# Patient Record
Sex: Male | Born: 1975
Health system: Southern US, Community
[De-identification: ages and names within clinical notes are randomized; demographics above are authoritative.]

## PROBLEM LIST (undated history)

## (undated) DIAGNOSIS — R0683 Snoring: Secondary | ICD-10-CM

## (undated) DIAGNOSIS — E119 Type 2 diabetes mellitus without complications: Secondary | ICD-10-CM

## (undated) DIAGNOSIS — G4730 Sleep apnea, unspecified: Secondary | ICD-10-CM

## (undated) DIAGNOSIS — I10 Essential (primary) hypertension: Secondary | ICD-10-CM

## (undated) DIAGNOSIS — I493 Ventricular premature depolarization: Secondary | ICD-10-CM

## (undated) HISTORY — DX: Snoring: R06.83

## (undated) HISTORY — PX: WRIST SURGERY: SHX841

## (undated) HISTORY — DX: Ventricular premature depolarization: I49.3

---

## 2008-06-12 ENCOUNTER — Emergency Department (HOSPITAL_COMMUNITY): Admission: EM | Admit: 2008-06-12 | Discharge: 2008-06-12 | Payer: Self-pay | Admitting: Emergency Medicine

## 2010-07-02 LAB — WOUND CULTURE

## 2011-01-01 ENCOUNTER — Inpatient Hospital Stay (INDEPENDENT_AMBULATORY_CARE_PROVIDER_SITE_OTHER)
Admission: RE | Admit: 2011-01-01 | Discharge: 2011-01-01 | Disposition: A | Discharge status: Home / Self Care | Payer: BC Managed Care – PPO | Source: Ambulatory Visit | Attending: Emergency Medicine | Admitting: Emergency Medicine

## 2011-01-01 DIAGNOSIS — Z0489 Encounter for examination and observation for other specified reasons: Secondary | ICD-10-CM

## 2011-01-22 ENCOUNTER — Inpatient Hospital Stay (INDEPENDENT_AMBULATORY_CARE_PROVIDER_SITE_OTHER)
Admission: RE | Admit: 2011-01-22 | Discharge: 2011-01-22 | Disposition: A | Discharge status: Home / Self Care | Payer: BC Managed Care – PPO | Source: Ambulatory Visit | Attending: Family Medicine | Admitting: Family Medicine

## 2011-01-22 DIAGNOSIS — S61209A Unspecified open wound of unspecified finger without damage to nail, initial encounter: Secondary | ICD-10-CM

## 2011-04-27 ENCOUNTER — Encounter (HOSPITAL_COMMUNITY): Payer: Self-pay

## 2011-04-27 ENCOUNTER — Emergency Department (INDEPENDENT_AMBULATORY_CARE_PROVIDER_SITE_OTHER)
Admission: EM | Admit: 2011-04-27 | Discharge: 2011-04-27 | Disposition: A | Discharge status: Home / Self Care | Payer: BC Managed Care – PPO | Source: Home / Self Care | Attending: Family Medicine | Admitting: Family Medicine

## 2011-04-27 DIAGNOSIS — J111 Influenza due to unidentified influenza virus with other respiratory manifestations: Secondary | ICD-10-CM

## 2011-04-27 MED ORDER — OSELTAMIVIR PHOSPHATE 75 MG PO CAPS: 75.0000 mg | ORAL_CAPSULE | Freq: Two times a day (BID) | ORAL | Status: AC

## 2011-04-27 MED ORDER — GUAIFENESIN-CODEINE 100-10 MG/5ML PO SYRP: 10.0000 mL | ORAL_SOLUTION | Freq: Four times a day (QID) | ORAL | Status: AC | PRN

## 2011-04-27 NOTE — ED Provider Notes (Signed)
History     CSN: [id number]  Arrival date & time 04/27/11  0915   First MD Initiated Contact with Patient 04/27/11 [phone]      Chief Complaint  Patient presents with  . Fever  . URI    (Consider location/radiation/quality/duration/timing/severity/associated sxs/prior treatment) Patient is a 36 y.o. male presenting with fever and URI. The history is provided by the patient and the spouse.  Fever Primary symptoms of the febrile illness include fever, fatigue, cough and myalgias. Primary symptoms do not include nausea, vomiting, diarrhea, dysuria or rash. The current episode started yesterday. This is a new problem. The problem has been gradually worsening.  URI The primary symptoms include fever, fatigue, cough and myalgias. Primary symptoms do not include nausea, vomiting or rash.  Symptoms associated with the illness include chills, congestion and rhinorrhea.    History reviewed. No pertinent past medical history.  History reviewed. No pertinent past surgical history.  No family history on file.  History  Substance Use Topics  . Smoking status: Never Smoker   . Smokeless tobacco: Not on file  . Alcohol Use: No      Review of Systems  Constitutional: Positive for fever, chills and fatigue.  HENT: Positive for congestion, rhinorrhea and postnasal drip.   Respiratory: Positive for cough.   Cardiovascular: Negative.   Gastrointestinal: Negative.  Negative for nausea, vomiting and diarrhea.  Genitourinary: Negative for dysuria.  Musculoskeletal: Positive for myalgias.  Skin: Negative for rash.    Allergies  Review of patient's allergies indicates no known allergies.  Home Medications   Current Outpatient Rx  Name Route Sig Dispense Refill  . GUAIFENESIN-CODEINE 100-10 MG/5ML PO SYRP Oral Take 10 mLs by mouth 4 (four) times daily as needed for cough. 180 mL 0  . OSELTAMIVIR PHOSPHATE 75 MG PO CAPS Oral Take 1 capsule (75 mg total) by mouth every 12 (twelve) hours. 10  capsule 0    BP 139/96  Pulse 88  Temp(Src) 98.4 F (36.9 C) (Oral)  Resp 17  SpO2 95%  Physical Exam  Nursing note and vitals reviewed. Constitutional: He is oriented to person, place, and time. He appears well-developed and well-nourished.  HENT:  Head: Normocephalic.  Right Ear: External ear normal.  Left Ear: External ear normal.  Nose: Nose normal.  Mouth/Throat: Oropharynx is clear and moist.  Eyes: Conjunctivae are normal. Pupils are equal, round, and reactive to light.  Neck: Normal range of motion. Neck supple.  Cardiovascular: Normal rate, normal heart sounds and intact distal pulses.   Pulmonary/Chest: Effort normal and breath sounds normal.  Neurological: He is alert and oriented to person, place, and time.  Skin: Skin is warm and dry.  Psychiatric: He has a normal mood and affect.    ED Course  Procedures (including critical care time)  Labs Reviewed - No data to display No results found.   1. Influenza       MDM          Barkley Bruns, MD 04/27/11 1013

## 2011-04-27 NOTE — ED Notes (Signed)
C/o head and sinus congestion, stuffy nose, cough, fevers, nausea, bodyaches and sneezing since yesterday.

## 2011-10-10 IMAGING — CR DG FINGER THUMB 2+V*L*
3 series · 3 of 3 positions shown · non-contrast
Comparison: None.

CLINICAL DATA: Recent laceration.

LEFT THUMB 2+V

[x finger pa left]
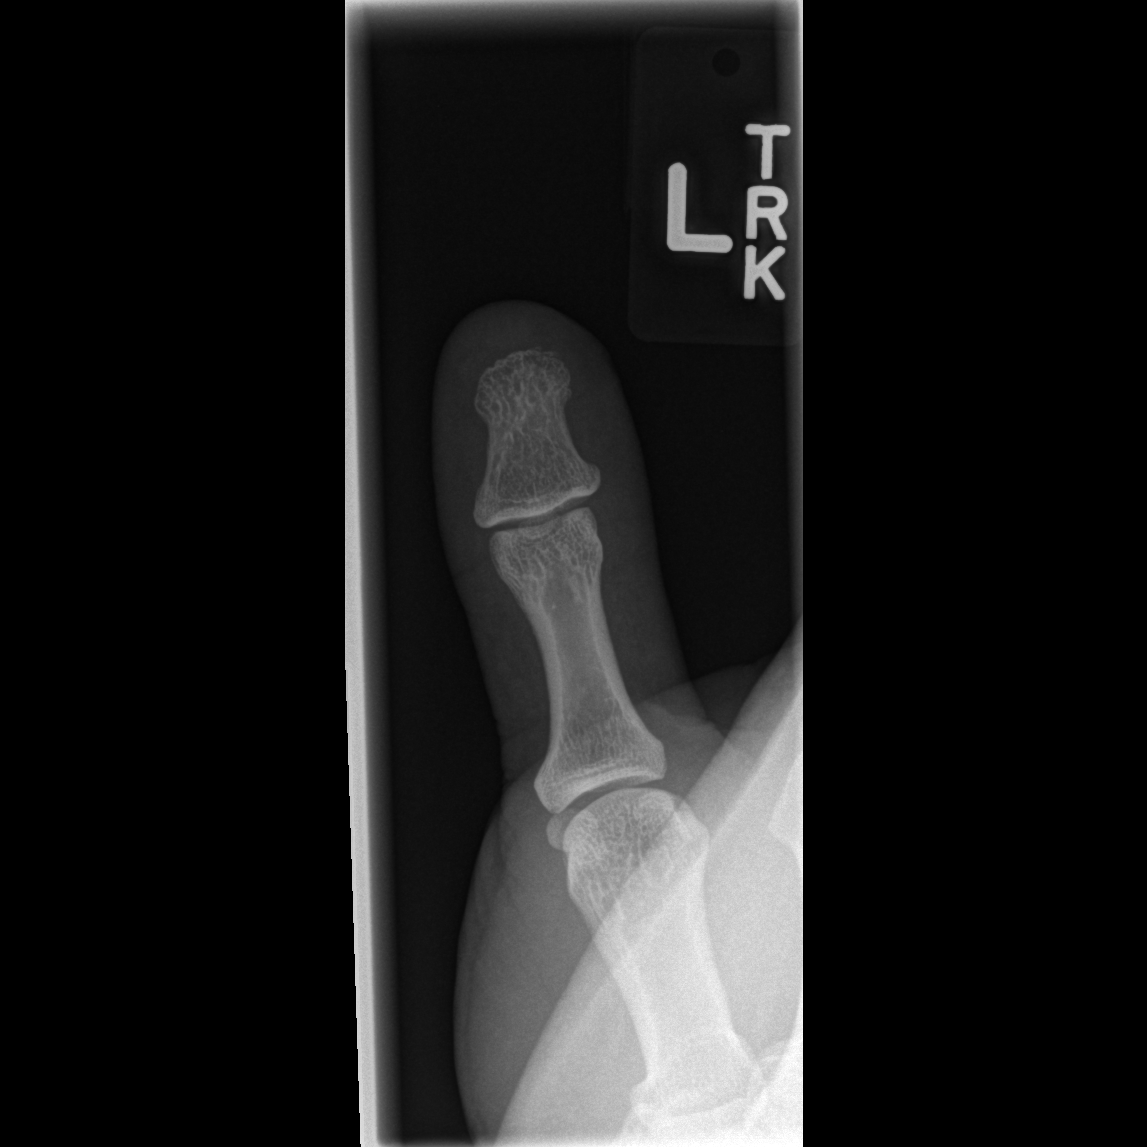

[x finger obl. left]
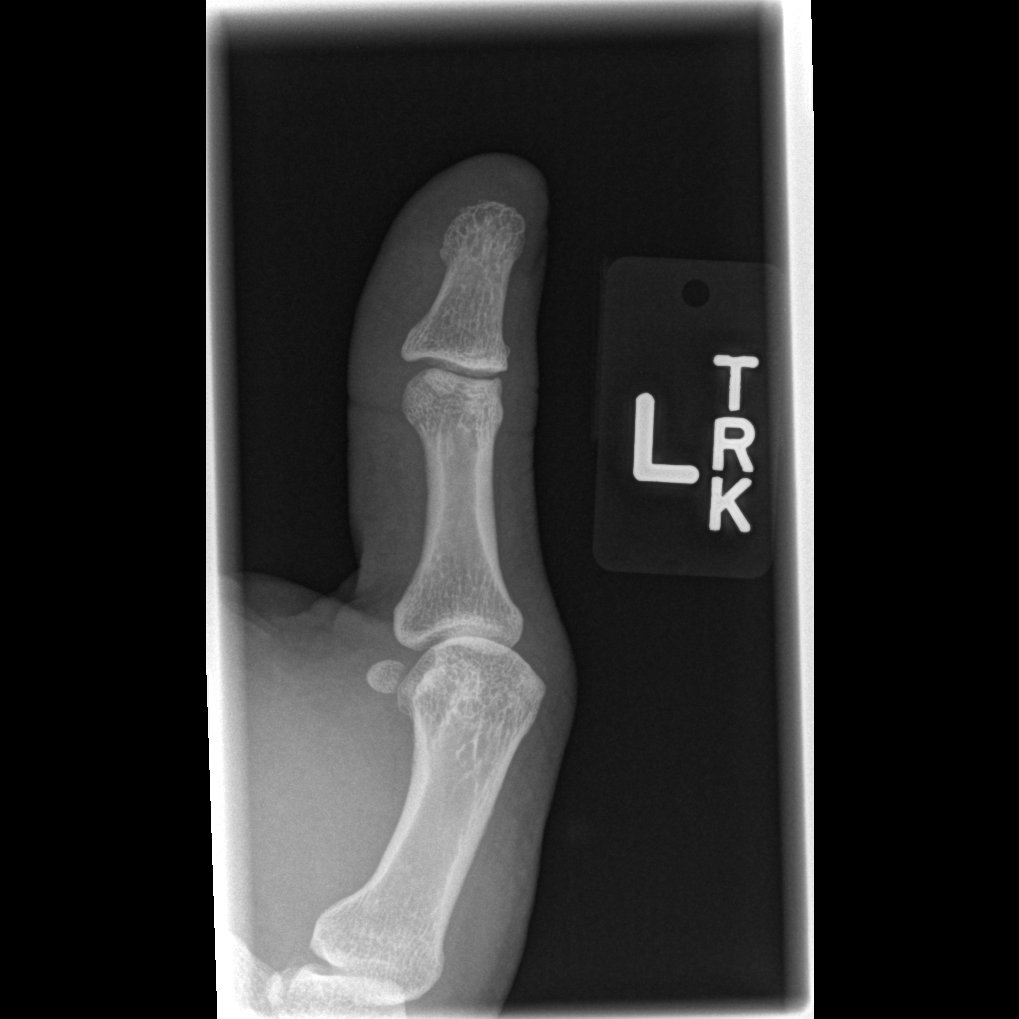

[x finger lateral left]
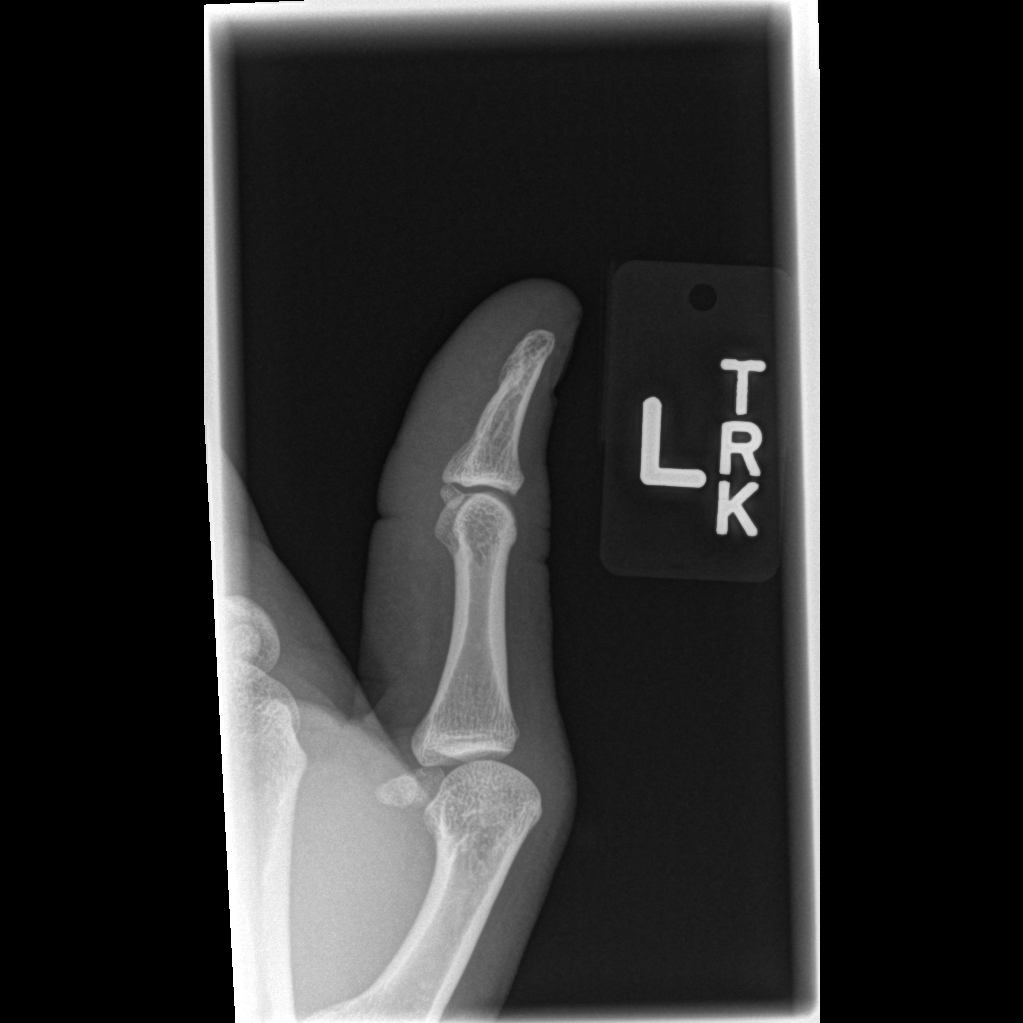

[3 of 3 positions shown; findings below may reference images not displayed]

FINDINGS: No acute fracture or dislocation is noted.] Mild soft
tissue changes are noted consistent with the recent injury.
IMPRESSION: No acute bony abnormality is seen.

## 2012-01-05 ENCOUNTER — Emergency Department (HOSPITAL_COMMUNITY)
Admission: EM | Admit: 2012-01-05 | Discharge: 2012-01-05 | Disposition: A | Discharge status: Home / Self Care | Payer: BC Managed Care – PPO | Attending: Emergency Medicine | Admitting: Emergency Medicine

## 2012-01-05 ENCOUNTER — Encounter (HOSPITAL_COMMUNITY): Payer: Self-pay | Admitting: Family Medicine

## 2012-01-05 DIAGNOSIS — H5500 Unspecified nystagmus: Secondary | ICD-10-CM | POA: Insufficient documentation

## 2012-01-05 DIAGNOSIS — I10 Essential (primary) hypertension: Secondary | ICD-10-CM | POA: Insufficient documentation

## 2012-01-05 DIAGNOSIS — H8110 Benign paroxysmal vertigo, unspecified ear: Secondary | ICD-10-CM | POA: Insufficient documentation

## 2012-01-05 DIAGNOSIS — R51 Headache: Secondary | ICD-10-CM | POA: Insufficient documentation

## 2012-01-05 LAB — CBC WITH DIFFERENTIAL/PLATELET
Basophils Absolute: 0 10*3/uL (ref 0.0–0.1)
Eosinophils Absolute: 0.1 10*3/uL (ref 0.0–0.7)
Eosinophils Relative: 1 % (ref 0–5)
HCT: 37.8 % — ABNORMAL LOW (ref 39.0–52.0)
Lymphocytes Relative: 25 % (ref 12–46)
MCH: 26.6 pg (ref 26.0–34.0)
MCV: 82.5 fL (ref 78.0–100.0)
Monocytes Absolute: 0.5 10*3/uL (ref 0.1–1.0)
RDW: 12 % (ref 11.5–15.5)
WBC: 8 10*3/uL (ref 4.0–10.5)

## 2012-01-05 LAB — BASIC METABOLIC PANEL
CO2: 29 mEq/L (ref 19–32)
Calcium: 9 mg/dL (ref 8.4–10.5)
Creatinine, Ser: 0.81 mg/dL (ref 0.50–1.35)
Glucose, Bld: 79 mg/dL (ref 70–99)

## 2012-01-05 LAB — TROPONIN I: Troponin I: <0.3 ng/mL (ref <0.30)

## 2012-01-05 MED ORDER — MECLIZINE HCL 25 MG PO TABS: 25.0000 mg | ORAL_TABLET | Freq: Three times a day (TID) | ORAL | Status: DC | PRN

## 2012-01-05 MED ORDER — MECLIZINE HCL 25 MG PO TABS
50.0000 mg | ORAL_TABLET | Freq: Once | ORAL | Status: AC
  Administered 2012-01-05: 50 mg via ORAL
  Filled 2012-01-05: qty 2

## 2012-01-05 NOTE — ED Provider Notes (Signed)
History     CSN: [id number]  Arrival date & time 01/05/12  1122   First MD Initiated Contact with Patient 01/05/12 1200      Chief Complaint  Patient presents with  . Hypertension  . Dizziness    (Consider location/radiation/quality/duration/timing/severity/associated sxs/prior treatment) HPI Comments: Luis Lopez is a 36 y.o. Male who presents with complaint of dizziness. Stats was getting dressed this morning, stood up and room began spinning. States could not see straight. States had to sit down, symptoms improved within about 2 min. States now having slight headache. States has had similar symptoms in the past. States thought it may be related to his blood pressure. States recently has been checking it at home it was in 160s. Pt states symptoms worsened by movement. Denies taking any medications for this. Denies numbness, weakness of extremities. No chest pain or SOB.    History reviewed. No pertinent past medical history.  History reviewed. No pertinent past surgical history.  History reviewed. No pertinent family history.  History  Substance Use Topics  . Smoking status: Never Smoker   . Smokeless tobacco: Not on file  . Alcohol Use: No      Review of Systems  Neurological: Positive for dizziness, light-headedness and headaches.  All other systems reviewed and are negative.    Allergies  Review of patient's allergies indicates no known allergies.  Home Medications  No current outpatient prescriptions on file.  BP 143/79  Pulse 71  Temp 97 F (36.1 C)  Resp 16  SpO2 99%  Physical Exam  Nursing note and vitals reviewed. Constitutional: He appears well-developed. No distress.  HENT:  Head: Normocephalic and atraumatic.  Right Ear: External ear normal.  Left Ear: External ear normal.  Nose: Nose normal.  Mouth/Throat: Oropharynx is clear and moist.  Eyes: Conjunctivae normal are normal. Pupils are equal, round, and reactive to light.       Horizontal  nystagmus present  Neck: Normal range of motion. Neck supple.  Cardiovascular: Normal rate, regular rhythm and normal heart sounds.   Pulmonary/Chest: Effort normal and breath sounds normal. No respiratory distress. He has no wheezes.  Abdominal: Soft. Bowel sounds are normal. He exhibits no distension. There is no tenderness. There is no rebound.  Musculoskeletal: Normal range of motion. He exhibits no edema.  Lymphadenopathy:    He has no cervical adenopathy.  Neurological: He is alert.       5/5 and equal upper and lower extremity strength bilaterally. Equal grip strength bilaterally. Normal finger to nose and heel to shin. No pronator drift. Patellar reflexes 2+   Skin: Skin is warm and dry. No rash noted. No erythema.  Psychiatric: He has a normal mood and affect.    ED Course  Procedures (including critical care time)  Pt with vertigo like symptoms. Exam shows normal neurologic exam except for vertical nystagmus present. Pt does have worsening dizziness with head repositioning. Symptoms intermittent. Pt low risk for CVA/central vertigo. Will try meclizine, labs.   Results for orders placed during the hospital encounter of 01/05/12  CBC WITH DIFFERENTIAL      Component Value Range   WBC 8.0  4.0 - 10.5 K/uL   RBC 4.58  4.22 - 5.81 MIL/uL   Hemoglobin 12.2 (*) 13.0 - 17.0 g/dL   HCT 30.8 (*) 65.7 - 84.6 %   MCV 82.5  78.0 - 100.0 fL   MCH 26.6  26.0 - 34.0 pg   MCHC 32.3  30.0 - 36.0 g/dL  RDW 12.0  11.5 - 15.5 %   Platelets 165  150 - 400 K/uL   Neutrophils Relative 67  43 - 77 %   Neutro Abs 5.4  1.7 - 7.7 K/uL   Lymphocytes Relative 25  12 - 46 %   Lymphs Abs 2.0  0.7 - 4.0 K/uL   Monocytes Relative 6  3 - 12 %   Monocytes Absolute 0.5  0.1 - 1.0 K/uL   Eosinophils Relative 1  0 - 5 %   Eosinophils Absolute 0.1  0.0 - 0.7 K/uL   Basophils Relative 0  0 - 1 %   Basophils Absolute 0.0  0.0 - 0.1 K/uL  BASIC METABOLIC PANEL      Component Value Range   Sodium 137  135  - 145 mEq/L   Potassium 4.0  3.5 - 5.1 mEq/L   Chloride 102  96 - 112 mEq/L   CO2 29  19 - 32 mEq/L   Glucose, Bld 79  70 - 99 mg/dL   BUN 15  6 - 23 mg/dL   Creatinine, Ser 1.61  0.50 - 1.35 mg/dL   Calcium 9.0  8.4 - 09.6 mg/dL   GFR calc non Af Amer >90  >90 mL/min   GFR calc Af Amer >90  >90 mL/min  TROPONIN I      Component Value Range   Troponin I <0.30  <0.30 ng/mL     Date: 01/05/2012  Rate: 67  Rhythm: normal sinus rhythm  QRS Axis: normal  Intervals: normal  ST/T Wave abnormalities: normal  Conduction Disutrbances:none  Narrative Interpretation:   Old EKG Reviewed: none available   3:54 PM  Labs unremarkable. ECG normal. Pt's symptoms resolved with meclizine. He ambulated with no difficulties. He denies dizziness. Normal gait. Normal neuro exam. Will d/c home. Do not think this is central vertigo based on resolution of symptoms and normal neuro examination. Instructed to return if worsening.    1. Vertigo, benign paroxysmal       MDM          Lottie Mussel, PA 01/05/12 1556

## 2012-01-05 NOTE — ED Provider Notes (Signed)
Medical screening examination/treatment/procedure(s) were performed by non-physician practitioner and as supervising physician I was immediately available for consultation/collaboration.  Jayelle Page, MD 01/05/12 1654 

## 2012-01-05 NOTE — ED Notes (Addendum)
Pt sts that he was getting dressed for work this am and stood up and felt like the room was spinning. sts has been seen and told to monitor high blood pressure and it has been consistently high. sts also a HA

## 2012-11-28 ENCOUNTER — Encounter (HOSPITAL_COMMUNITY): Payer: Self-pay | Admitting: *Deleted

## 2012-11-28 ENCOUNTER — Emergency Department (HOSPITAL_COMMUNITY): Payer: Worker's Compensation

## 2012-11-28 ENCOUNTER — Emergency Department (HOSPITAL_COMMUNITY)
Admission: EM | Admit: 2012-11-28 | Discharge: 2012-11-28 | Disposition: A | Discharge status: Home / Self Care | Payer: Worker's Compensation | Attending: Emergency Medicine | Admitting: Emergency Medicine

## 2012-11-28 DIAGNOSIS — S61209A Unspecified open wound of unspecified finger without damage to nail, initial encounter: Secondary | ICD-10-CM | POA: Insufficient documentation

## 2012-11-28 DIAGNOSIS — Z23 Encounter for immunization: Secondary | ICD-10-CM | POA: Insufficient documentation

## 2012-11-28 DIAGNOSIS — W292XXA Contact with other powered household machinery, initial encounter: Secondary | ICD-10-CM | POA: Insufficient documentation

## 2012-11-28 DIAGNOSIS — Y990 Civilian activity done for income or pay: Secondary | ICD-10-CM | POA: Insufficient documentation

## 2012-11-28 DIAGNOSIS — Y9289 Other specified places as the place of occurrence of the external cause: Secondary | ICD-10-CM | POA: Insufficient documentation

## 2012-11-28 DIAGNOSIS — Y9389 Activity, other specified: Secondary | ICD-10-CM | POA: Insufficient documentation

## 2012-11-28 MED ORDER — TETANUS-DIPHTH-ACELL PERTUSSIS 5-2.5-18.5 LF-MCG/0.5 IM SUSP
0.5000 mL | Freq: Once | INTRAMUSCULAR | Status: AC
  Administered 2012-11-28: 0.5 mL via INTRAMUSCULAR
  Filled 2012-11-28: qty 0.5

## 2012-11-28 MED ORDER — OXYCODONE-ACETAMINOPHEN 5-325 MG PO TABS
1.0000 | ORAL_TABLET | Freq: Once | ORAL | Status: AC
  Administered 2012-11-28: 1 via ORAL
  Filled 2012-11-28: qty 1

## 2012-11-28 MED ORDER — TETANUS-DIPHTH-ACELL PERTUSSIS 5-2.5-18.5 LF-MCG/0.5 IM SUSP: 0.5000 mL | Freq: Once | INTRAMUSCULAR | Status: DC

## 2012-11-28 MED ORDER — OXYCODONE-ACETAMINOPHEN 5-325 MG PO TABS: 2.0000 | ORAL_TABLET | Freq: Four times a day (QID) | ORAL | Status: DC | PRN

## 2012-11-28 MED ORDER — CEPHALEXIN 500 MG PO CAPS: 500.0000 mg | ORAL_CAPSULE | Freq: Four times a day (QID) | ORAL | Status: DC

## 2012-11-28 NOTE — ED Notes (Signed)
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  Bleeding controlled at this time.  Pt had a tetanus shot 3 years ago.  Pt reports 10/10 pain.

## 2012-11-28 NOTE — ED Notes (Signed)
Applied ns soaked gauze to thumb

## 2012-11-28 NOTE — ED Provider Notes (Signed)
CSN: [id number]     Arrival date & time 11/28/12  1513 History  This chart was scribed for non-physician practitioner Roxy Horseman, PA-C working with Gavin Pound. Oletta Lamas, MD by Danella Maiers, ED Scribe. This patient was seen in room TR11C/TR11C and the patient's care was started at 3:26 PM.    Chief Complaint  Patient presents with  . Laceration   The history is provided by the patient. No language interpreter was used.   HPI Comments: Luis Lopez is a 37 y.o. male who presents to the Emergency Department complaining of left thumb laceration that occurred while slicing the bone inside of a pork chop with a jigsaw at work and his hand slipped. Pain is mild with pain meds.  Bleeding is controlled.  No other complaints.  His last tetanus vaccine was 5 years ago.   History reviewed. No pertinent past medical history. History reviewed. No pertinent past surgical history. No family history on file. History  Substance Use Topics  . Smoking status: Never Smoker   . Smokeless tobacco: Not on file  . Alcohol Use: No    Review of Systems  All other systems reviewed and are negative.    Allergies  Review of patient's allergies indicates no known allergies.  Home Medications  No current outpatient prescriptions on file. BP 166/103  Pulse 85  Temp(Src) 98 F (36.7 C) (Oral)  Resp 22  SpO2 97% Physical Exam  Nursing note and vitals reviewed. Constitutional: He is oriented to person, place, and time. He appears well-developed and well-nourished. No distress.  HENT:  Head: Normocephalic and atraumatic.  Eyes: EOM are normal.  Neck: Neck supple. No tracheal deviation present.  Cardiovascular: Normal rate and intact distal pulses.   Brisk capillary refill.  Pulmonary/Chest: Effort normal. No respiratory distress.  Musculoskeletal: Normal range of motion.  Neurological: He is alert and oriented to person, place, and time.  Skin: Skin is warm and dry.  Avulsion injury of distal left  thumb.  Psychiatric: He has a normal mood and affect. His behavior is normal.    ED Course  Procedures (including critical care time) Medications  TDaP (BOOSTRIX) injection 0.5 mL (not administered)  oxyCODONE-acetaminophen (PERCOCET/ROXICET) 5-325 MG per tablet 1 tablet (1 tablet Oral Given 11/28/12 1523)  TDaP (BOOSTRIX) injection 0.5 mL (0.5 mLs Intramuscular Given 11/28/12 1601)   DIAGNOSTIC STUDIES: Oxygen Saturation is 97% on room air, normal by my interpretation.    COORDINATION OF CARE: 3:50 PM- Discussed treatment plan with pt which includes a left hand xray and pt agrees to plan.    Labs Review Labs Reviewed - No data to display Imaging Review Dg Finger Thumb Left  11/28/2012   *RADIOLOGY REPORT*  Clinical Data: Recent laceration.  LEFT THUMB 2+V  Comparison: None.  Findings: No acute fracture or dislocation is noted. Mild soft tissue changes are noted consistent with the recent injury.  IMPRESSION: No acute bony abnormality is seen.   Original Report Authenticated By: Alcide Clever, M.D.    MDM   1. Fingertip avulsion, initial encounter    Patient with fingertip avulsion to left thumb. I do not believe that there is anything that can be repaired. The nailbed is intact, but the lateral aspect of the distal common has been completely avulsed. No exposure to the bone, no fractures. Fluids rest with a bulky dressing, and give close followup with Dr. Amanda Pea. Patient discussed with Dr. Oletta Lamas, who agrees with plan. Patient is stable and ready for discharge. Pain meds and  antibiotics for home.    I personally performed the services described in this documentation, which was scribed in my presence. The recorded information has been reviewed and is accurate.    Roxy Horseman, PA-C 11/28/12 1720

## 2012-11-30 NOTE — ED Provider Notes (Signed)
Medical screening examination/treatment/procedure(s) were performed by non-physician practitioner and as supervising physician I was immediately available for consultation/collaboration.  Brannon Decaire Y. Kreston Ahrendt, MD 11/30/12 2206 

## 2014-10-26 ENCOUNTER — Encounter (HOSPITAL_COMMUNITY): Payer: Self-pay | Admitting: *Deleted

## 2014-10-26 ENCOUNTER — Emergency Department (HOSPITAL_COMMUNITY)
Admission: EM | Admit: 2014-10-26 | Discharge: 2014-10-26 | Disposition: A | Discharge status: Home / Self Care | Payer: Self-pay | Attending: Emergency Medicine | Admitting: Emergency Medicine

## 2014-10-26 DIAGNOSIS — K088 Other specified disorders of teeth and supporting structures: Secondary | ICD-10-CM | POA: Insufficient documentation

## 2014-10-26 DIAGNOSIS — Z79899 Other long term (current) drug therapy: Secondary | ICD-10-CM | POA: Insufficient documentation

## 2014-10-26 DIAGNOSIS — K0889 Other specified disorders of teeth and supporting structures: Secondary | ICD-10-CM

## 2014-10-26 DIAGNOSIS — K061 Gingival enlargement: Secondary | ICD-10-CM | POA: Insufficient documentation

## 2014-10-26 MED ORDER — PENICILLIN V POTASSIUM 250 MG PO TABS
250.0000 mg | ORAL_TABLET | Freq: Four times a day (QID) | ORAL | Status: AC
Start: 2014-10-26 — End: 2014-11-02

## 2014-10-26 NOTE — ED Notes (Signed)
Pt has growth to left lower gumline and also has left lower side dental pain. Airway intact.

## 2014-10-26 NOTE — ED Notes (Signed)
Declined W/C at D/C and was escorted to lobby by RN. 

## 2014-10-26 NOTE — ED Provider Notes (Signed)
CSN: [id number]     Arrival date & time 10/26/14  1228 History  This chart was scribed for Montine Circle, PA-C, working with Jola Schmidt, MD by Steva Colder, ED Scribe. The patient was seen in room TR11C/TR11C at 1:31 PM.    Chief Complaint  Patient presents with  . Dental Pain      The history is provided by the patient. No language interpreter was used.    Luis Lopez is a 39 y.o. male who presents to the Emergency Department complaining of left lower dental pain onset 5 months. Pt reports that he has a "growth" to his left lower gumline. Pt does not have a dentist that he can see. He reports that the area is now beginning to irritate him and that he now has decay to the tooth behind it. Pt reports that he had an abscess to the affected area that has began to drain. He states that he is having associated symptoms of nausea. He denies sore throat, fever, chills, and any other symptoms. Pt denies allergies to any medications.   History reviewed. No pertinent past medical history. History reviewed. No pertinent past surgical history. History reviewed. No pertinent family history. History  Substance Use Topics  . Smoking status: Never Smoker   . Smokeless tobacco: Not on file  . Alcohol Use: No    Review of Systems  Constitutional: Negative for fever and chills.  HENT: Positive for dental problem. Negative for sore throat and trouble swallowing.        Growth on left lower gumline  Gastrointestinal: Positive for nausea.      Allergies  Review of patient's allergies indicates no known allergies.  Home Medications   Prior to Admission medications   Medication Sig Start Date End Date Taking? Authorizing Provider  cephALEXin (KEFLEX) 500 MG capsule Take 1 capsule (500 mg total) by mouth 4 (four) times daily. 11/28/12   Montine Circle, PA-C  oxyCODONE-acetaminophen (PERCOCET/ROXICET) 5-325 MG per tablet Take 2 tablets by mouth every 6 (six) hours as needed for pain. 11/28/12    Montine Circle, PA-C   BP 146/89 mmHg  Pulse 83  Temp(Src) 98.5 F (36.9 C) (Oral)  Resp 18  Ht 6\' 4"  (1.93 m)  Wt 317 lb (143.79 kg)  BMI 38.60 kg/m2  SpO2 96% Physical Exam  Constitutional: He is oriented to person, place, and time. He appears well-developed and well-nourished. No distress.  HENT:  Head: Normocephalic and atraumatic.  Mouth/Throat:    Patient with toothache.  No gross abscess.  Exam unconcerning for Ludwig's angina or spread of infection.  Will treat with penicillin and pain medicine.  Urged patient to follow-up with dentist.     Eyes: EOM are normal.  Neck: Neck supple. No tracheal deviation present.  Cardiovascular: Normal rate.   Pulmonary/Chest: Effort normal. No respiratory distress.  Musculoskeletal: Normal range of motion.  Neurological: He is alert and oriented to person, place, and time.  Skin: Skin is warm and dry.  Psychiatric: He has a normal mood and affect. His behavior is normal.  Nursing note and vitals reviewed.   ED Course  Procedures (including critical care time) DIAGNOSTIC STUDIES: Oxygen Saturation is 96% on RA, nl by my interpretation.    COORDINATION OF CARE: 1:35 PM-Discussed treatment plan which includes referral to dentist and abx Rx with pt at bedside and pt agreed to plan.   Labs Review Labs Reviewed - No data to display  Imaging Review No results found.   EKG  Interpretation None      MDM   Final diagnoses:  Pain, dental  Gingival hyperplasia    Patient with toothache.  No gross abscess.  He also has a small growth on his gums which I urged him to have examined by a dentist or oral surgeon. Exam unconcerning for Ludwig's angina or spread of infection.  Will treat with penicillin and OTC pain medicine.  Urged patient to follow-up with dentist.     I personally performed the services described in this documentation, which was scribed in my presence. The recorded information has been reviewed and is  accurate.     Montine Circle, PA-C 10/26/14 [place], MD 10/26/14 [phone]

## 2014-10-26 NOTE — Discharge Instructions (Signed)
YOU NEED TO HAVE THE GROWTH ON YOUR GUMS EXAMINED BY A DENTIST OR ORAL SURGEON AS SOON AS POSSIBLE.  Dental Pain A tooth ache may be caused by cavities (tooth decay). Cavities expose the nerve of the tooth to air and hot or cold temperatures. It may come from an infection or abscess (also called a boil or furuncle) around your tooth. It is also often caused by dental caries (tooth decay). This causes the pain you are having. DIAGNOSIS  Your caregiver can diagnose this problem by exam. TREATMENT   If caused by an infection, it may be treated with medications which kill germs (antibiotics) and pain medications as prescribed by your caregiver. Take medications as directed.  Only take over-the-counter or prescription medicines for pain, discomfort, or fever as directed by your caregiver.  Whether the tooth ache today is caused by infection or dental disease, you should see your dentist as soon as possible for further care. SEEK MEDICAL CARE IF: The exam and treatment you received today has been provided on an emergency basis only. This is not a substitute for complete medical or dental care. If your problem worsens or new problems (symptoms) appear, and you are unable to meet with your dentist, call or return to this location. SEEK IMMEDIATE MEDICAL CARE IF:   You have a fever.  You develop redness and swelling of your face, jaw, or neck.  You are unable to open your mouth.  You have severe pain uncontrolled by pain medicine. MAKE SURE YOU:   Understand these instructions.  Will watch your condition.  Will get help right away if you are not doing well or get worse. Document Released: 03/08/2005 Document Revised: 05/31/2011 Document Reviewed: 10/25/2007 [facility] Patient Information 2015 [place], [place]. This information is not intended to replace advice given to you by your health care provider. Make sure you discuss any questions you have with your health care provider.

## 2015-04-07 ENCOUNTER — Emergency Department (HOSPITAL_COMMUNITY)
Admission: EM | Admit: 2015-04-07 | Discharge: 2015-04-07 | Disposition: A | Discharge status: Home / Self Care | Payer: Self-pay | Attending: Emergency Medicine | Admitting: Emergency Medicine

## 2015-04-07 ENCOUNTER — Encounter (HOSPITAL_COMMUNITY): Payer: Self-pay | Admitting: Adult Health

## 2015-04-07 DIAGNOSIS — I83892 Varicose veins of left lower extremities with other complications: Secondary | ICD-10-CM

## 2015-04-07 DIAGNOSIS — I8392 Asymptomatic varicose veins of left lower extremity: Secondary | ICD-10-CM | POA: Insufficient documentation

## 2015-04-07 LAB — I-STAT CHEM 8, ED
BUN: 18 mg/dL (ref 6–20)
CHLORIDE: 104 mmol/L (ref 101–111)
Calcium, Ion: 1.23 mmol/L (ref 1.12–1.23)
Creatinine, Ser: 1.1 mg/dL (ref 0.61–1.24)
GLUCOSE: 117 mg/dL — AB (ref 65–99)
HEMATOCRIT: 39 % (ref 39.0–52.0)
HEMOGLOBIN: 13.3 g/dL (ref 13.0–17.0)
POTASSIUM: 4 mmol/L (ref 3.5–5.1)
Sodium: 144 mmol/L (ref 135–145)
TCO2: 25 mmol/L (ref 0–100)

## 2015-04-07 NOTE — ED Notes (Signed)
Presents with left lower leg that had a prick in leg and then vericose vein was bleeding and "shooting out" bleeding stopped at this time.

## 2015-04-07 NOTE — Discharge Instructions (Signed)
Bleeding Varicose Veins Varicose veins are veins that have become enlarged and twisted. Valves in the veins help return blood from the leg to the heart. If these valves are damaged, blood flows backward and backs up into the veins in the leg near the skin. This causes the veins to become larger because of increased pressure within them. Sometimes these veins bleed. CAUSES  Factors that can lead to bleeding varicose veins include:  Thinning of the skin that covers the veins. This skin is stretched as the veins enlarge.  Weak and thinning walls of the varicose veins. These thin walls are part of the reason why blood is not flowing normally to the heart.  Having high pressure in the veins. This high pressure occurs because the blood is not flowing freely back up to the heart.  Injury. Even a small injury to a varicose vein can cause bleeding.  Open wounds. A sore may develop near a varicose vein and not heal. This makes bleeding more likely.  Taking medicine that thins the blood. These medicines may include aspirin, anti-inflammatory medicine, and other blood thinners. SIGNS AND SYMPTOMS  If bleeding is on the outside surface of the skin, blood can be seen. Sometimes, the bleeding stays under the skin. If this happens, the blue or purple area will spread beyond the vein. This discoloration may be visible. DIAGNOSIS  To decide if you have a bleeding varicose vein, your health care provider may:  Ask about your symptoms. This will include when you first saw bleeding.  Ask about how long you have had varicose veins and if they cause you problems.  Ask about your overall health.  Ask about possible causes, such as recent cuts or if the area near the varicose veins was bumped or injured.  Examine the skin or leg that concerns you. Your health care provider will probably feel the veins.  Order imaging tests. These create detailed pictures of the veins. TREATMENT  The first goal of treating  bleeding varicose veins is to stop the bleeding. Then, the aim is to keep any bleeding from happening again. Treatment will depend on the cause of the bleeding and how bad it is. Ask your health care provider about what would be best for you. Options include:  Raising (elevating) your leg. Lie down with your leg propped up on a pillow or cushion. Your foot should be above the level of your heart.  Applying pressure to the spot that is bleeding. The bleeding should stop in a short time.  Wearing elastic stockings that "compress" your legs (compression stockings). An elastic bandage may do the same thing.  Applying an antibiotic cream on sores that are not healing.  Closing off or surgically removing the bleeding varicose veins with one of the following:  Sclerotherapy. A solution is injected into the vein to close it off.  Laser treatment. A laser is used to heat the vein to close it off.  Radiofrequency vein ablation. An electrical current produced by radio waves is used to close off the vein.  Phlebectomy. The vein is surgically removed through small incisions made over the varicose vein.  Vein ligation and stripping. The vein is surgically removed through incisions made over the varicose vein after the vein has been tied (ligated). HOME CARE INSTRUCTIONS   Apply any creams that your health care provider prescribed. Follow the directions carefully.  Wear compression stockings or any wraps as directed by your health care provider. Make sure you know:  If  you should wear them every day.  How long you should wear them.  If veins were removed or closed, a bandage (dressing) will probably cover the area. Make sure you know:  How often the dressing should be changed.  Whether the area can get wet.  When you can leave the skin uncovered.  Check your skin every day. Look for new sores and signs of bleeding.  To prevent future bleeding:  Use extra care in situations where you could  cut your legs, such as when shaving or gardening.  Try to keep your legs elevated as much as possible. Lie down when you can. SEEK MEDICAL CARE IF:   Your veins continue to bleed.  You develop new sores near your varicose veins.  You have a sore that does not heal or gets bigger.  You have increased pain in your leg.  The area around a varicose vein becomes warm, red, or tender to the touch.  You notice a yellowish fluid that smells bad coming from a spot where there was bleeding.  You have a fever. SEEK IMMEDIATE MEDICAL CARE IF:   You have chest pain or difficulty breathing.  You have severe leg pain.   This information is not intended to replace advice given to you by your health care provider. Make sure you discuss any questions you have with your health care provider.   Document Released: 07/25/2008 Document Revised: 03/29/2014 Document Reviewed: 07/10/2013 Elsevier Interactive Patient Education 2016 [employer]  Stasis Dermatitis Stasis dermatitis occurs when veins lose the ability to pump blood back to the heart (poor venous circulation). It causes a reddish-purple to brownish scaly, itchy rash on the legs. The rash comes from pooling of blood (stasis). CAUSES  This occurs because the veins do not work very well anymore or because pressure may be increased in the veins due to other conditions. With blood pooling, the increased pressure in the tiny blood vessels (capillaries) causes fluid to leak out of the capillaries into the tissue. The extra fluid makes it harder for the blood to feed the cells and get rid of waste products. SYMPTOMS  Stasis dermatitis appears as red, scaly, itchy patches on the legs. A yellowish or light brown discoloration is also present. Due to scratching or other injury, these patches can become an ulcer. This ulcer may remain for long periods of time. The ulcer can also become infected. Swelling of the legs is often present with stasis dermatitis.  If the leg is swollen, this increases the risk of infection and further damage to the skin. Sometimes, intense itching, tingling, and burning occurs before signs of stasis dermatitis appear. You may find yourself scratching the insides of your ankles or rubbing your ankles together before the rash appears. After healing, there are often brown spots on the affected skin. DIAGNOSIS  Your caregiver makes this diagnosis based on an exam. Other tests may be done to better understand the cause. TREATMENT  If underlying conditions are present, they must be treated. Some of these conditions are heart failure, thyroid problems, poor nutrition, and varicose veins.  Cortisone creams and ointments applied to the skin (topically) may be needed, as well as medicine to reduce swelling in the legs (diuretics).  Compression stockings or an elastic wrap may also be needed to reduce swelling.  If there is an infection, antibiotic medicines may also be used. HOME CARE INSTRUCTIONS   Try to rest and raise (elevate) the affected leg above the level of the heart,  if possible.  Burow's solution wet packs applied for 30 minutes, 3 times daily, will help the weepy rash. Stop using the packs before your skin gets too dry. You can also use a mixture of 3 parts white vinegar to 1 quart water.  Grease your legs daily with ointments, such as petroleum jelly, to fight dryness.  Avoid scratching or injuring the area. SEEK IMMEDIATE MEDICAL CARE IF:   Your rash gets worse.  An ulcer forms.  You have an oral temperature above 102 F (38.9 C), not controlled by medicine.  You have any other severe symptoms.   This information is not intended to replace advice given to you by your health care provider. Make sure you discuss any questions you have with your health care provider.   Document Released: 06/17/2005 Document Revised: 05/31/2011 Document Reviewed: 07/24/2014 Elsevier Interactive Patient Education [employer]

## 2015-04-07 NOTE — ED Provider Notes (Signed)
CSN: [id number]     Arrival date & time 04/07/15  2124 History  By signing my name below, I, Hilda Lias, attest that this documentation has been prepared under the direction and in the presence of Threasa Alpha, PA-C.  Electronically Signed: Hilda Lias, ED Scribe. 04/07/2015. 10:39 PM.    Chief Complaint  Patient presents with  . Varicose Veins      The history is provided by the patient and the spouse. No language interpreter was used.   HPI Comments: Luis Lopez is a 40 y.o. male who presents to the Emergency Department complaining of bleeding from a varicose vein to his lateral left ankle that was pricked immediately PTA. His wife states that they could not control the bleeding until she wrapped his leg with a towel. Pt reports feeling lightheaded while his leg was bleeding. Pt currently denies lightheadedness, chest pain, SOB, palpitations, HA, fatigue, weakness. Pt denies issue with anemia, bleeding, or clotting.  No LE edema, erythema, rash, ulceration.  He denies any past complication with veins.  Denies pain.   History reviewed. No pertinent past medical history. History reviewed. No pertinent past surgical history. History reviewed. No pertinent family history. Social History  Substance Use Topics  . Smoking status: Never Smoker   . Smokeless tobacco: None  . Alcohol Use: No    Review of Systems  Constitutional: Negative for fever, chills, diaphoresis, activity change and fatigue.  HENT: Negative.   Respiratory: Negative.  Negative for chest tightness and shortness of breath.   Cardiovascular: Negative for chest pain.  Gastrointestinal: Negative.   Musculoskeletal: Negative.   Skin: Positive for wound. Negative for color change, pallor and rash.  Neurological: Negative.  Negative for dizziness, syncope, weakness, light-headedness and headaches.  Psychiatric/Behavioral: Negative.   All other systems reviewed and are negative.     Allergies  Review of patient's  allergies indicates no known allergies.  Home Medications   Prior to Admission medications   Medication Sig Start Date End Date Taking? Authorizing Provider  cephALEXin (KEFLEX) 500 MG capsule Take 1 capsule (500 mg total) by mouth 4 (four) times daily. Patient not taking: Reported on 04/07/2015 11/28/12   Montine Circle, PA-C  oxyCODONE-acetaminophen (PERCOCET/ROXICET) 5-325 MG per tablet Take 2 tablets by mouth every 6 (six) hours as needed for pain. Patient not taking: Reported on 04/07/2015 11/28/12   Montine Circle, PA-C   BP 134/88 mmHg  Pulse 77  Temp(Src) 97.8 F (36.6 C) (Oral)  Resp 16  SpO2 99% Physical Exam  Constitutional: He is oriented to person, place, and time. He appears well-developed and well-nourished. No distress.  HENT:  Head: Normocephalic and atraumatic.  Nose: Nose normal.  Mouth/Throat: Oropharynx is clear and moist. No oropharyngeal exudate.  No oral pallor, no palpebra conjunctiva pallor  Eyes: Conjunctivae and EOM are normal. Pupils are equal, round, and reactive to light. Right eye exhibits no discharge. Left eye exhibits no discharge. No scleral icterus.  Neck: Normal range of motion. No JVD present. No tracheal deviation present. No thyromegaly present.  Cardiovascular: Normal rate, regular rhythm, normal heart sounds and intact distal pulses.  Exam reveals no gallop and no friction rub.   No murmur heard. Pulmonary/Chest: Effort normal and breath sounds normal. No respiratory distress. He has no wheezes. He has no rales. He exhibits no tenderness.  Abdominal: Soft. Bowel sounds are normal. He exhibits no distension and no mass. There is no tenderness. There is no rebound and no guarding.  Musculoskeletal: Normal range of  motion. He exhibits no edema or tenderness.  Lymphadenopathy:    He has no cervical adenopathy.  Neurological: He is alert and oriented to person, place, and time. He has normal reflexes. No cranial nerve deficit. He exhibits normal  muscle tone. Coordination normal.  Skin: Skin is warm and dry. No rash noted. He is not diaphoretic. No erythema. No pallor.  Left lower leg, lateral aspect, superior to lateral malleolus, small hyperpigmented papule (< 1 cm in diameter) with central wound, no active bleeding when gauze and pressure dressing was removed. Bilateral LE hyperpigmentation with multiple varicose veins and spider veins, no pretibial edema, no ulceration, no erythema, no rash  Psychiatric: He has a normal mood and affect. His behavior is normal. Judgment and thought content normal.  Nursing note and vitals reviewed.   ED Course  Procedures (including critical care time)  DIAGNOSTIC STUDIES: Oxygen Saturation is 95% on room air, normal by my interpretation.    COORDINATION OF CARE: 10:36 PM Discussed treatment plan with pt at bedside and pt agreed to plan.   Labs Review Labs Reviewed  I-STAT CHEM 8, ED - Abnormal; Notable for the following:    Glucose, Bld 117 (*)    All other components within normal limits    Imaging Review No results found. I have personally reviewed and evaluated these images and lab results as part of my medical decision-making.   EKG Interpretation None      MDM   Pt with varicose veins that bled tonight after skin was accidentally "pricked" Pt had lightheadedness during active bleeding.   At time of exam there was no active bleeding, pt currently asymptomatic Chem 8 ordered to check H/H.  Pt is not pale, VSS.  H/H normal, no anemia, no further bleeding. Dressing applied.   Pt d/c home in stable and satisfactory condition.   Filed Vitals:   04/07/15 2154 04/07/15 2309  BP: 137/90 134/88  Pulse: 86 77  Temp: 97.8 F (36.6 C)   TempSrc: Oral   Resp: 18 16  SpO2: 95% 99%     Final diagnoses:  Bleeding from varicose veins of lower extremity, left  I personally performed the services described in this documentation, which was scribed in my presence. The recorded  information has been reviewed and is accurate.      Delsa Grana, PA-C 04/07/15 [place], MD 04/08/15 2252

## 2015-10-20 DIAGNOSIS — R0683 Snoring: Secondary | ICD-10-CM | POA: Diagnosis not present

## 2015-10-20 DIAGNOSIS — Z13228 Encounter for screening for other metabolic disorders: Secondary | ICD-10-CM | POA: Diagnosis not present

## 2015-10-20 DIAGNOSIS — Z13220 Encounter for screening for lipoid disorders: Secondary | ICD-10-CM | POA: Diagnosis not present

## 2015-10-20 DIAGNOSIS — R351 Nocturia: Secondary | ICD-10-CM | POA: Diagnosis not present

## 2015-10-24 DIAGNOSIS — F4321 Adjustment disorder with depressed mood: Secondary | ICD-10-CM | POA: Diagnosis not present

## 2015-10-30 DIAGNOSIS — F4321 Adjustment disorder with depressed mood: Secondary | ICD-10-CM | POA: Diagnosis not present

## 2015-11-05 DIAGNOSIS — F4321 Adjustment disorder with depressed mood: Secondary | ICD-10-CM | POA: Diagnosis not present

## 2015-11-06 ENCOUNTER — Encounter: Payer: Self-pay | Admitting: Neurology

## 2015-11-06 ENCOUNTER — Ambulatory Visit (INDEPENDENT_AMBULATORY_CARE_PROVIDER_SITE_OTHER): Payer: BLUE CROSS/BLUE SHIELD | Admitting: Neurology

## 2015-11-06 VITALS — BP 142/88 | HR 80 | Resp 20 | Ht 74.5 in | Wt 327.0 lb

## 2015-11-06 DIAGNOSIS — G4710 Hypersomnia, unspecified: Secondary | ICD-10-CM | POA: Diagnosis not present

## 2015-11-06 DIAGNOSIS — R351 Nocturia: Secondary | ICD-10-CM | POA: Insufficient documentation

## 2015-11-06 DIAGNOSIS — G4733 Obstructive sleep apnea (adult) (pediatric): Secondary | ICD-10-CM | POA: Diagnosis not present

## 2015-11-06 DIAGNOSIS — Z72820 Sleep deprivation: Secondary | ICD-10-CM

## 2015-11-06 DIAGNOSIS — G4730 Sleep apnea, unspecified: Secondary | ICD-10-CM | POA: Diagnosis not present

## 2015-11-06 NOTE — Patient Instructions (Signed)
Hypersomnia Hypersomnia is when you feel extremely tired during the day even though you're getting plenty of sleep at night. You may need to take naps during the day, and you may also be extremely difficult to wake up when you are sleeping.  CAUSES  The cause of your hypersomnia may not be known. Hypersomnia may be caused by:   Medicines.  Sleep disorders, such as narcolepsy.  Trauma or injury to your head or nervous system.  Using drugs or alcohol.  Tumors.  Medical conditions, such as depression or hypothyroidism.  Genetics. SIGNS AND SYMPTOMS  The main symptoms of hypersomnia include:   Feeling extremely tired throughout the day.  Being very difficult to wake up.  Sleeping for longer and longer periods.  Taking naps throughout the day. Other symptoms may include:   Feeling:  Restless.  Annoyed.  Anxious.  Low energy.  Having difficulty:  Remembering.  Speaking.  Thinking.  Losing your appetite.  Experiencing hallucinations. DIAGNOSIS  Hypersomnia may be diagnosed by:  Medical history and physical exam. This will include a sleep history.  Completing sleep logs.  Tests may also be done, such as:  Polysomnography.  Multiple sleep latency test (MSLT). TREATMENT  There is no cure for hypersomnia, but treatment can be very effective in helping manage the condition. Treatment may include:  Lifestyle and sleeping strategies to help cope with the condition.  Stimulant medicines.  Treating any underlying causes of hypersomnia. HOME CARE INSTRUCTIONS  Take medicines only as directed by your health care provider.  Schedule short naps for when you feel sleepiest during the day. Tell your employer or teachers that you have hypersomnia. You may be able to adjust your schedule to include time for naps.  Avoid drinking alcohol or caffeinated beverages.  Do not eat a heavy meal before bedtime. Eat at about the same times every day.  Do not drive or  operate heavy machinery if you are sleepy.  Do not swim or go out on the water without a life jacket.  If possible, adjust your schedule so that you do not have to work or be active at night.  Keep all follow-up visits as directed by your health care provider. This is important. SEEK MEDICAL CARE IF:   You have new symptoms.  Your symptoms get worse. SEEK IMMEDIATE MEDICAL CARE IF:  You have serious thoughts of hurting yourself or someone else.   This information is not intended to replace advice given to you by your health care provider. Make sure you discuss any questions you have with your health care provider.   Document Released: 02/26/2002 Document Revised: 03/29/2014 Document Reviewed: 10/11/2013 Elsevier Interactive Patient Education 2016 [employer]  

## 2015-11-06 NOTE — Progress Notes (Signed)
SLEEP MEDICINE CLINIC   Provider:  Larey Seat, M D  Referring Provider: [name], FNP Primary Care Physician:    Chief Complaint  Patient presents with  . New Patient (Initial Visit)    snores at night, witnessed apnea, never had a sleep study    HPI:  Luis Lopez is a 40 y.o. male , seen here as a referral  from NP [facility] for a sleep referral , based on a chief concern of excessive daytime sleepiness.  Mr. and Mrs. Luis Lopez are here together, and Mrs. Luis Lopez reports that she has known her husband for about 7 years now, and always found him to be sleepier than the average individual area Mr. Luis Lopez states that that wasn't an acute change but made him sleepier and he cannot correlate his sleepiness to a prescription drug or medical procedure or any kind of accident or trauma. It was a gradual change over many years leading to hypersomnia.  Mr. Luis Lopez is snoring and he has frequent nocturia, and his wife has witnessed apneas at night as well. He has fallen asleep at work and he has never the feeling that he had enough sleep is completely restored or refreshed. The nocturia is not helping it at as this fragments asleep further, he believes that he goes to the bathroom 6-7 times each night. He feels very much bothered by the frequency.  He endorsed the Epworth sleepiness score at 19 points.    Sleep habits are as follows: He usually reaches home after work around midnight, and he needs time to wind down. He usually can't go to bed and sleep until about 2 AM, The bedroom is described as cool, quiet and dark at the couple shares the bedroom. He sleeps mostly on his back but this is when he snores the loudest and his wife will nudge him to roll over. He uses 2 pillows. He is up every hour for his frequent nocturia. He rises usually in the morning at about 7 AM. He is severely sleep deprived having only 5 hours of sleep plus interruptions. Since his wife works night shifts he has to get her  children ready for school. He has no reserved nap times.   Sleep medical history and family sleep history: He has a strong family history of diabetes but has not been diagnosed.   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 It is especially important for him to be alert. 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  6 children, no toddlers in the house. He quit smoking about 7 years ago, but he was not a daily smoker and only smoked cigars. He has not been drinking alcohol for long time.Marland Kitchen He drinks 2 cups of coffee a day and takes energy drinks like red bull. 1-2 sodas a day, iced tea- 1-2 a day.     Review of Systems: Out of a complete 14 system review, the patient complains of only the following symptoms, and all other reviewed systems are negative. He endorsed vertigo, snoring, weight gain, not enough sleep, dizziness, memory loss, insomnia and excessive hypersomnia, snoring. Witnessed apneas.  How likely are you to doze in the following situations: 0 = not likely, 1 = slight chance, 2 = moderate chance, 3 = high chance  Sitting and Reading? 2 Watching Television? 3 Sitting inactive in a public place (theater or meeting)?2 Lying down in the afternoon when circumstances permit?2 Sitting and talking  to someone?2 Sitting quietly after lunch without alcohol?3 In a car, while stopped for a few minutes in traffic?2 As a passenger in a car for an hour without a break?3  Total = 19   , Fatigue severity score 52  , depression score  2/15    Social History   Social History  . Marital status: Married    Spouse name: N/A  . Number of children: N/A  . Years of education: N/A   Occupational History  . Not on file.   Social History Main Topics  . Smoking status: Former Smoker    Quit date: 03/22/2008  . Smokeless tobacco: Never Used  . Alcohol use No  . Drug use: No  . Sexual activity: Not on file   Other Topics Concern  .  Not on file   Social History Narrative  . No narrative on file    Family History  Problem Relation Age of Onset  . Diabetes Father   . Diabetes Brother     Past Medical History:  Diagnosis Date  . Snoring     No past surgical history on file.  No current outpatient prescriptions on file.   No current facility-administered medications for this visit.     Allergies as of 11/06/2015  . (No Known Allergies)    Vitals: BP (!) 142/88   Pulse 80   Resp 20   Ht 6' 2.5" (1.892 m)   Wt (!) 327 lb (148.3 kg)   BMI 41.42 kg/m  Last Weight:  Wt Readings from Last 1 Encounters:  11/06/15 (!) 327 lb (148.3 kg)   [id number] mass index is 41.42 kg/m.     Last Height:   Ht Readings from Last 1 Encounters:  11/06/15 6' 2.5" (1.892 m)    Gained 100 pounds in 10 years.   Physical exam:  General: The patient is awake, alert and appears not in acute distress. The patient is well groomed. Head: Normocephalic, atraumatic. Neck is supple. Mallampati 3  neck circumference:21. Nasal airflow patent , TMJ is not evident . Retrognathia is seen.  Biological teeth.  Cardiovascular:  Regular rate and rhythm, without  murmurs or carotid bruit, and without distended neck veins. Respiratory: Lungs are clear to auscultation. Skin:  Without evidence of edema, or rash Trunk: BMI is elevated . The patient's posture is erect   Neurologic exam : The patient is awake and alert, oriented to place and time.   Attention span & concentration ability appears normal.  Speech is fluent,  without  dysarthria, dysphonia or aphasia.  Mood and affect are appropriate.  Cranial nerves: Pupils are equal and briskly reactive to light. Funduscopic exam without evidence of pallor or edema. Extraocular movements  in vertical and horizontal planes intact and without nystagmus. Visual fields by finger perimetry are intact.Hearing to finger rub intact. Facial sensation intact to fine touch.Facial motor strength is  symmetric and tongue and uvula move midline. Shoulder shrug was symmetrical.   Motor exam:  Normal tone, muscle bulk and symmetric strength in all extremities.  Sensory:  Fine touch, pinprick and vibration/  Proprioception tested in the upper extremities was normal.  Coordination: Rapid alternating movements /Finger-to-nose maneuver  normal without evidence of ataxia, dysmetria or tremor.  Gait and station: Patient walks without assistive device and is able unassisted to climb up to the exam table. Strength within normal limits.  Stance is stable and normal.  Deep tendon reflexes: in the  upper and lower extremities are symmetric and  intact. Babinski maneuver response is downgoing.  The patient was advised of the nature of the diagnosed sleep disorder , the treatment options and risks for general a health and wellness arising from not treating the condition.  I spent more than 45 minutes of face to face time with the patient. Greater than 50% of time was spent in counseling and coordination of care. We have discussed the diagnosis and differential and I answered the patient's questions.     Assessment:  After physical and neurologic examination, review of laboratory studies,  Personal review of imaging studies, reports of other /same  Imaging studies ,  Results of polysomnography/ neurophysiology testing and pre-existing records as far as provided in visit., my assessment is   1) Mr. Luis Lopez has multiple high risk factors for obstructive sleep apnea including a larger neck circumference and a high body mass index that places in the category of morbid obesity. He has gained the weight gradually over the last 10 years and this seems to correlate with an increase in daytime sleepiness. In addition he is severely sleep deprived allowing himself only 5 hours in bed and these are still fragmented by nocturia. His wife has noticed him to snore loudly when in supine position, and she has noticed apneas as  well. I will order a split night polysomnography for this patient. SPLIT at 15, watch for hypoxemia. He has sleep related headaches actually states that a lot of times he wakes up with headaches ( not by headaches ) and for this reason I will also order capnography. The nocturia could be partially related to caffeine intake, partially OSA related, he is currently not treated for hypertension.  I was able to review his labs and he has actually excellent cholesterol levels of 143 g/dL, hemoglobin A1c was 5.3% TSH was 1.5 and his complete metabolic panel and CBC with differential were unremarkable.  Plan:  Treatment plan and additional workup :  SPLIT with Co2, RV with me after study      Asencion Partridge Zarinah Oviatt MD  11/06/2015   CC: [name] [address] [place], [place] [address]

## 2015-11-13 DIAGNOSIS — F4321 Adjustment disorder with depressed mood: Secondary | ICD-10-CM | POA: Diagnosis not present

## 2015-11-20 DIAGNOSIS — F4321 Adjustment disorder with depressed mood: Secondary | ICD-10-CM | POA: Diagnosis not present

## 2015-12-04 DIAGNOSIS — F4321 Adjustment disorder with depressed mood: Secondary | ICD-10-CM | POA: Diagnosis not present

## 2015-12-10 DIAGNOSIS — F4321 Adjustment disorder with depressed mood: Secondary | ICD-10-CM | POA: Diagnosis not present

## 2015-12-22 ENCOUNTER — Ambulatory Visit (INDEPENDENT_AMBULATORY_CARE_PROVIDER_SITE_OTHER): Payer: BLUE CROSS/BLUE SHIELD | Admitting: Neurology

## 2015-12-22 DIAGNOSIS — Z72820 Sleep deprivation: Secondary | ICD-10-CM

## 2015-12-22 DIAGNOSIS — R351 Nocturia: Secondary | ICD-10-CM

## 2015-12-22 DIAGNOSIS — G4730 Sleep apnea, unspecified: Secondary | ICD-10-CM

## 2015-12-22 DIAGNOSIS — G4710 Hypersomnia, unspecified: Secondary | ICD-10-CM

## 2015-12-22 DIAGNOSIS — G4733 Obstructive sleep apnea (adult) (pediatric): Secondary | ICD-10-CM | POA: Diagnosis not present

## 2015-12-23 ENCOUNTER — Telehealth: Payer: Self-pay

## 2015-12-23 DIAGNOSIS — G4733 Obstructive sleep apnea (adult) (pediatric): Secondary | ICD-10-CM

## 2015-12-23 NOTE — Telephone Encounter (Signed)
I spoke to pt. I advised him that his sleep study revealed severe osa and Dr. Brett Fairy recommends starting a cpap. Pt is agreeable to starting a cpap. I advised pt to lose weight, by diet and exercise if not contraindicated by his other physicians. I will send order to [facility]. Pt verbalized understanding of results. Pt asked if he could have a letter for work stating that he had a sleep study the night of 12/22/2015. He wants to pick this letter up at the front desk. I advised him that it would be ready tomorrow for pick up. Pt verbalized understanding. Pt had no questions at this time but was encouraged to call back if questions arise. A follow up appt was made with pt for 02/23/2016. Pt verbalized understanding.  Dr. Brett Fairy agreed to pt's work note. She signed it and it has been placed at the front desk for pick up.

## 2016-01-08 DIAGNOSIS — G4733 Obstructive sleep apnea (adult) (pediatric): Secondary | ICD-10-CM | POA: Diagnosis not present

## 2016-02-23 ENCOUNTER — Telehealth: Payer: Self-pay

## 2016-02-23 ENCOUNTER — Ambulatory Visit: Payer: Self-pay | Admitting: Neurology

## 2016-02-23 NOTE — Telephone Encounter (Signed)
Patient did not show to appt today  

## 2016-02-24 ENCOUNTER — Encounter: Payer: Self-pay | Admitting: Neurology

## 2016-05-18 ENCOUNTER — Encounter (HOSPITAL_COMMUNITY): Payer: Self-pay

## 2016-05-18 ENCOUNTER — Emergency Department (HOSPITAL_COMMUNITY)
Admission: EM | Admit: 2016-05-18 | Discharge: 2016-05-18 | Disposition: A | Discharge status: Home / Self Care | Payer: Self-pay | Attending: Emergency Medicine | Admitting: Emergency Medicine

## 2016-05-18 ENCOUNTER — Emergency Department (HOSPITAL_COMMUNITY): Payer: Self-pay

## 2016-05-18 DIAGNOSIS — Z79899 Other long term (current) drug therapy: Secondary | ICD-10-CM | POA: Insufficient documentation

## 2016-05-18 DIAGNOSIS — R0789 Other chest pain: Secondary | ICD-10-CM | POA: Insufficient documentation

## 2016-05-18 DIAGNOSIS — I10 Essential (primary) hypertension: Secondary | ICD-10-CM | POA: Insufficient documentation

## 2016-05-18 DIAGNOSIS — E119 Type 2 diabetes mellitus without complications: Secondary | ICD-10-CM | POA: Insufficient documentation

## 2016-05-18 DIAGNOSIS — Z87891 Personal history of nicotine dependence: Secondary | ICD-10-CM | POA: Insufficient documentation

## 2016-05-18 HISTORY — DX: Essential (primary) hypertension: I10

## 2016-05-18 HISTORY — DX: Type 2 diabetes mellitus without complications: E11.9

## 2016-05-18 HISTORY — DX: Sleep apnea, unspecified: G47.30

## 2016-05-18 LAB — BASIC METABOLIC PANEL
Anion gap: 10 (ref 5–15)
BUN: 14 mg/dL (ref 6–20)
CO2: 23 mmol/L (ref 22–32)
Calcium: 8.8 mg/dL — ABNORMAL LOW (ref 8.9–10.3)
Chloride: 103 mmol/L (ref 101–111)
Creatinine, Ser: 1.14 mg/dL (ref 0.61–1.24)
GFR calc Af Amer: >60 mL/min (ref >60)
GFR calc non Af Amer: >60 mL/min (ref >60)
Glucose, Bld: 111 mg/dL — ABNORMAL HIGH (ref 65–99)
Potassium: 4.9 mmol/L (ref 3.5–5.1)
Sodium: 136 mmol/L (ref 135–145)

## 2016-05-18 LAB — CBC
HCT: 36.3 % — ABNORMAL LOW (ref 39.0–52.0)
Hemoglobin: 11.5 g/dL — ABNORMAL LOW (ref 13.0–17.0)
MCH: 26.3 pg (ref 26.0–34.0)
MCHC: 31.7 g/dL (ref 30.0–36.0)
MCV: 82.9 fL (ref 78.0–100.0)
Platelets: 144 10*3/uL — ABNORMAL LOW (ref 150–400)
RBC: 4.38 MIL/uL (ref 4.22–5.81)
RDW: 12.4 % (ref 11.5–15.5)
WBC: 8.8 10*3/uL (ref 4.0–10.5)

## 2016-05-18 LAB — I-STAT TROPONIN, ED
Troponin i, poc: 0 ng/mL (ref 0.00–0.08)
Troponin i, poc: 0 ng/mL (ref 0.00–0.08)

## 2016-05-18 IMAGING — DX DG CHEST 2V
2 series · 2 of 2 positions shown · non-contrast
Comparison: None.

CLINICAL DATA: Left and central chest pain.

EXAM:
CHEST  2 VIEW

[chest pa]
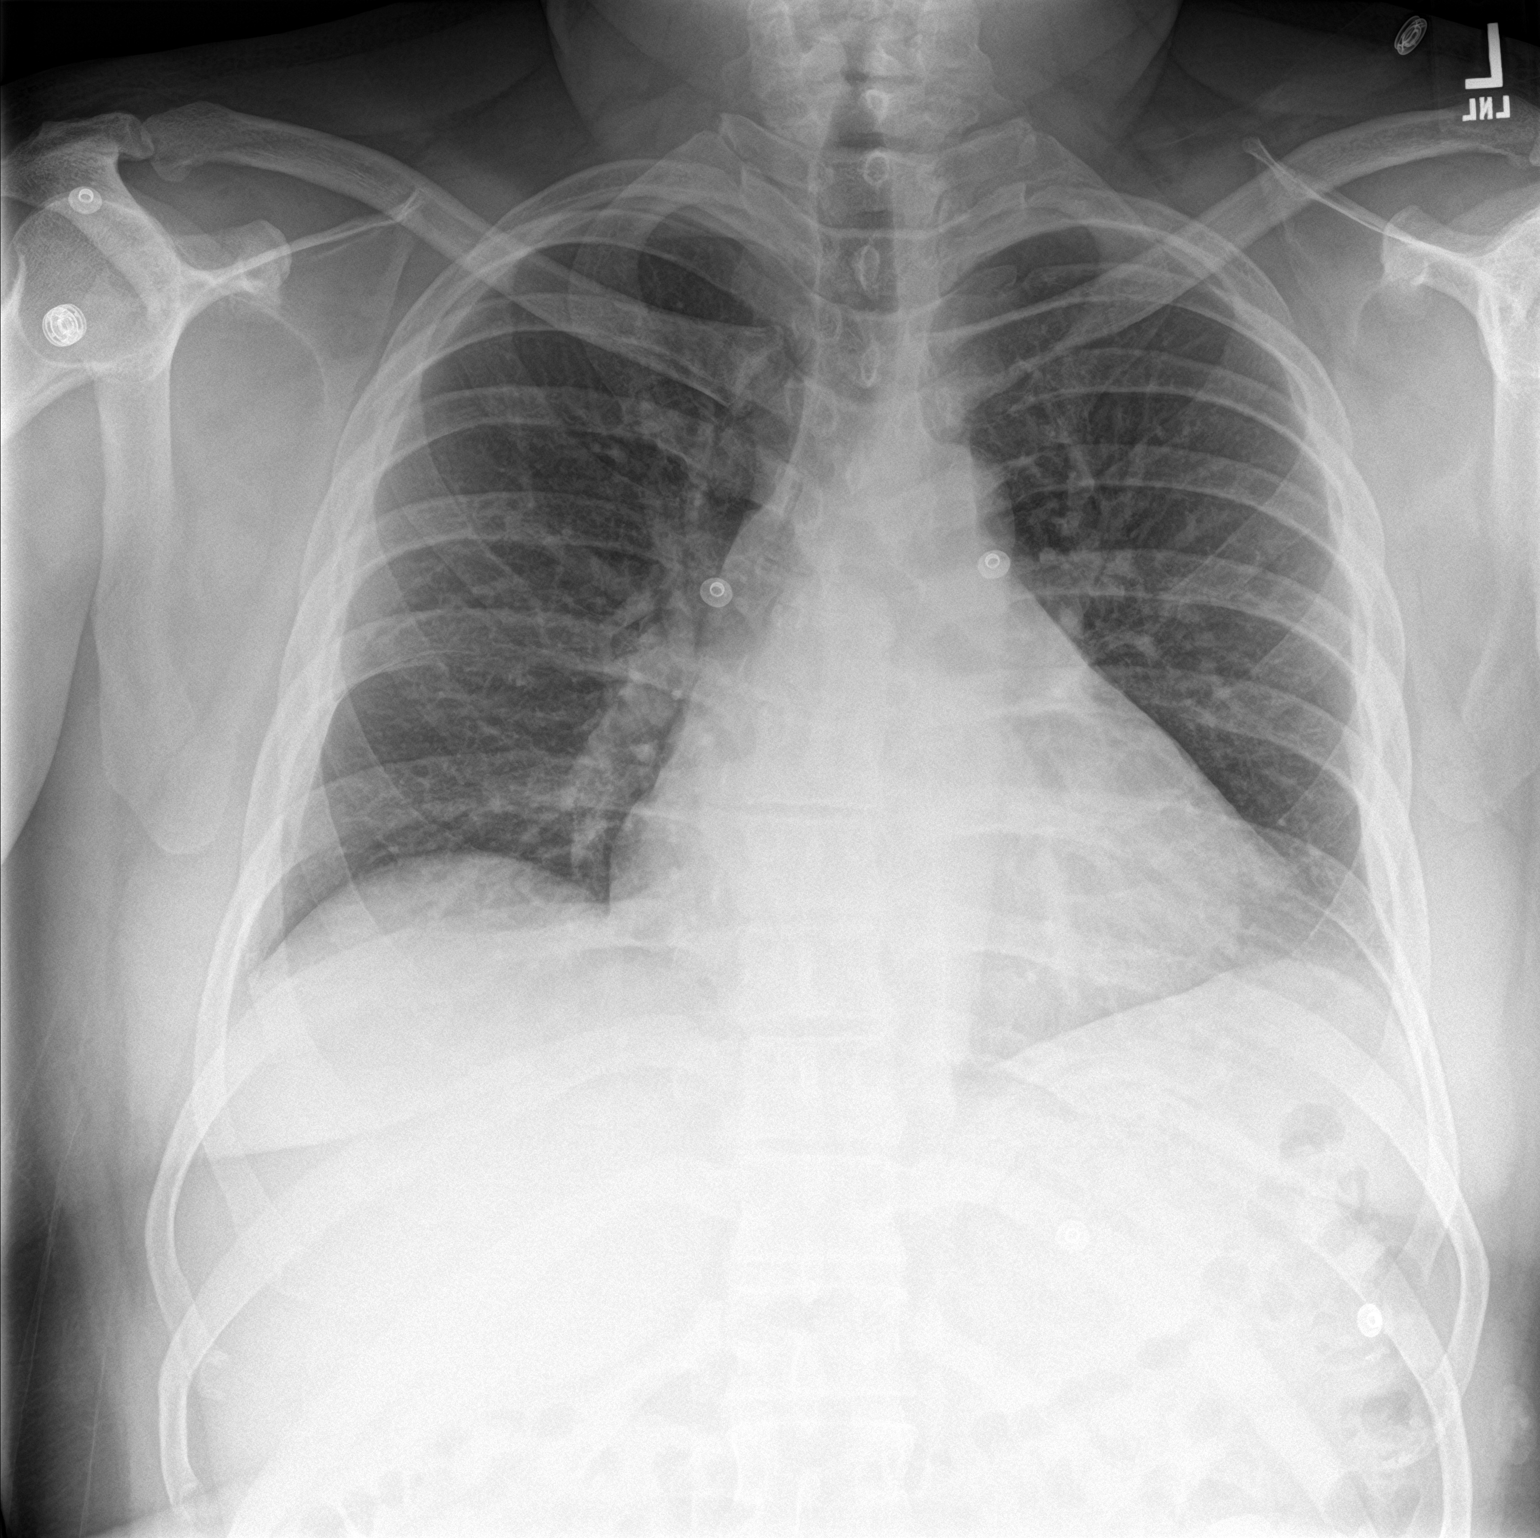

[chest lat]
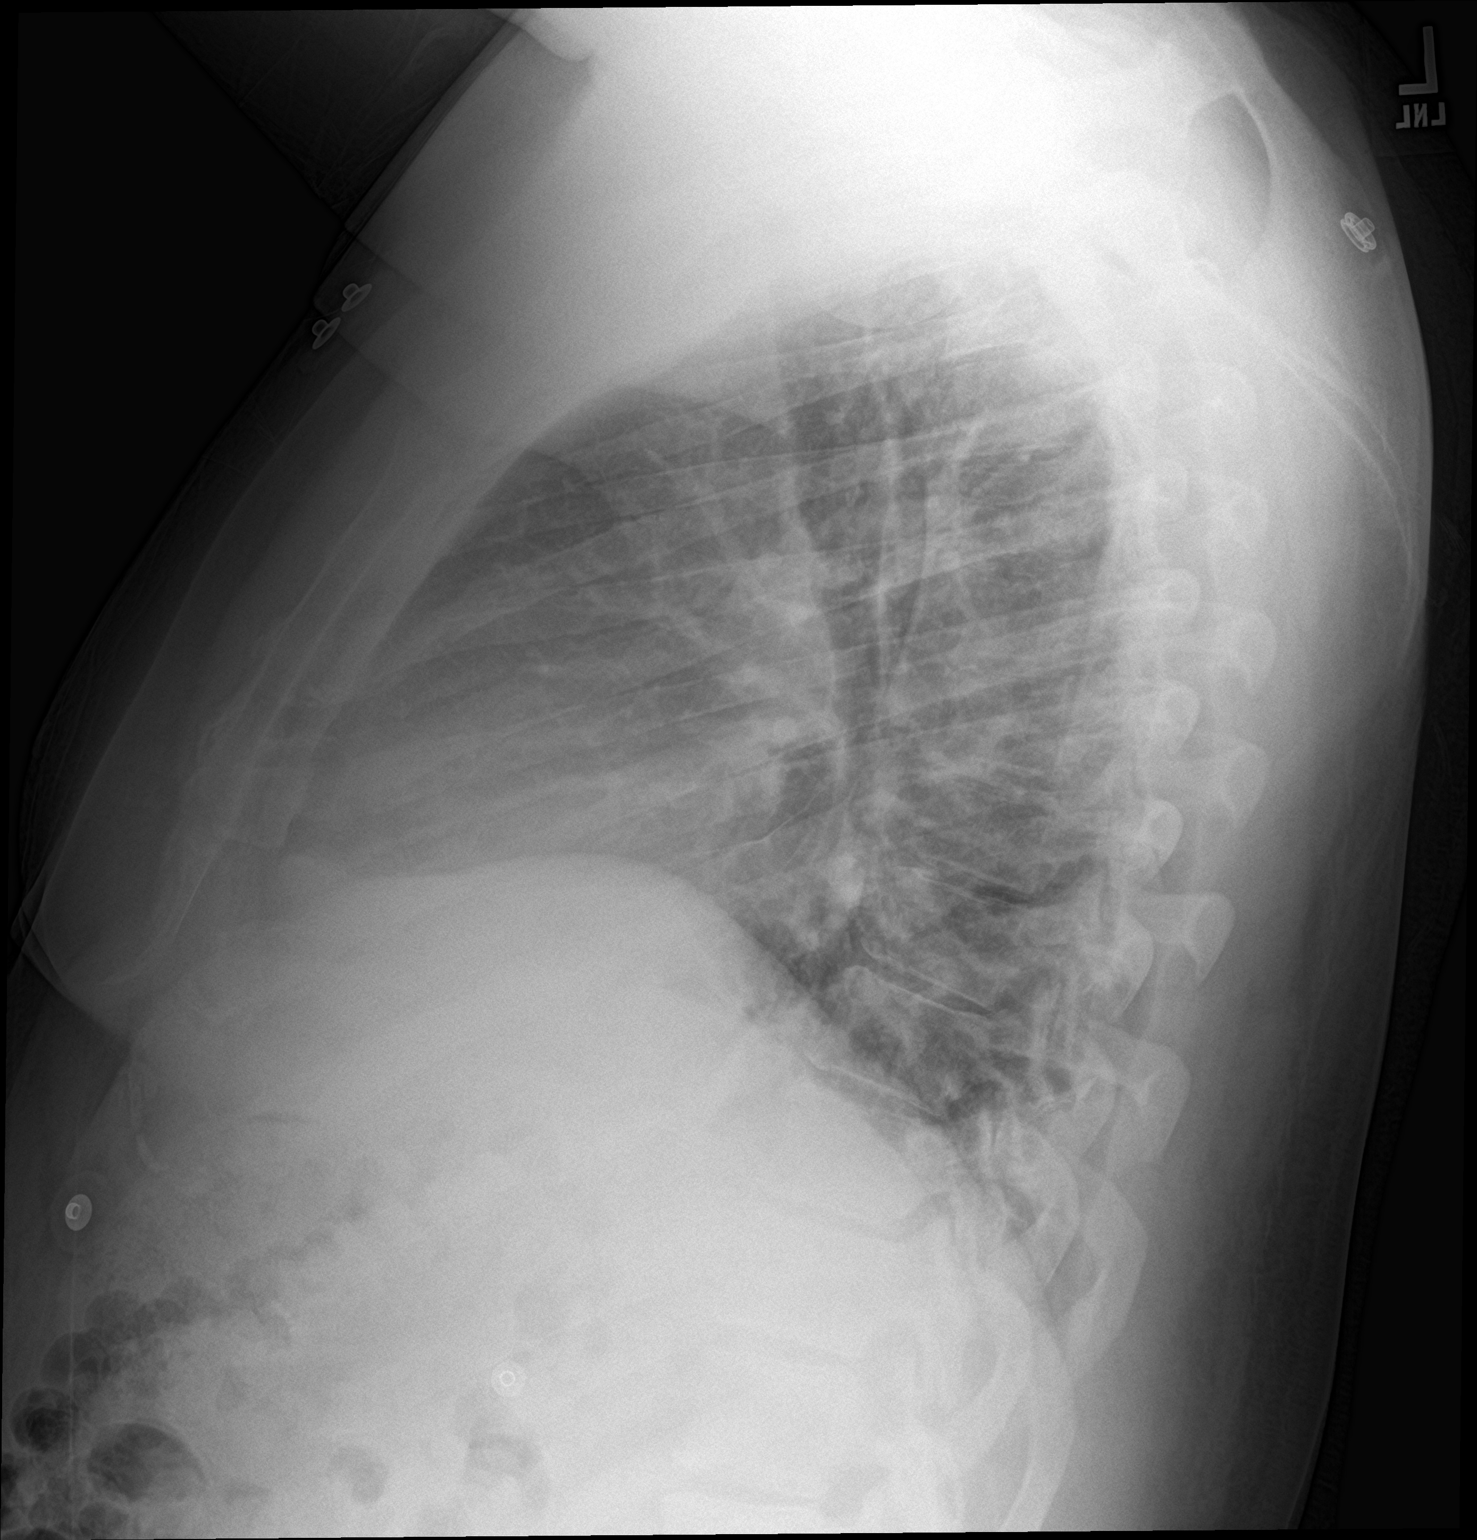

[2 of 2 positions shown; findings below may reference images not displayed]

FINDINGS: The cardiomediastinal contours are normal. Heart is at the upper
limits normal in size. Pulmonary vasculature is normal. No
consolidation, pleural effusion, or pneumothorax. No acute osseous
abnormalities are seen.
IMPRESSION: No acute abnormality. Upper normal heart size with normal
mediastinal contours.

## 2016-05-18 MED ORDER — IBUPROFEN 600 MG PO TABS: 600.0000 mg | ORAL_TABLET | Freq: Four times a day (QID) | ORAL | 0 refills | Status: DC | PRN

## 2016-05-18 MED ORDER — NITROGLYCERIN 0.4 MG SL SUBL: 0.4000 mg | SUBLINGUAL_TABLET | SUBLINGUAL | Status: DC | PRN

## 2016-05-18 NOTE — ED Provider Notes (Signed)
[facility] DEPT Provider Note   CSN: [id number] Arrival date & time: 05/18/16  0125   By signing my name below, I, Luis Lopez, attest that this documentation has been prepared under the direction and in the presence of Varney Biles, MD  Electronically Signed: Delton Lopez, ED Scribe. 05/18/16. 2:11 AM.   History   Chief Complaint Chief Complaint  Patient presents with  . Chest Pain   The history is provided by the patient. No language interpreter was used.   HPI Comments:  Luis Lopez is a 41 y.o. male, with a PMHx of DM and HTN, who presents to the Emergency Department, via EMS, complaining of acute onset, "6/10", sharp, left sided chest pain which he describes as an tightness onset 1 hour. He notes he was sitting at the table writing when he looked away and noticed his pain begin. He states his initial pain severity was an "8/10", states his pain radiates to his back and notes his pain was radiating down his extremity. His pain is worse with movement of his head and while sitting up. Pt was given 324 mg ASA and 1 nitroglycerin by EMS with mild relief. He denies pain with his head looking straight forward, current radiation of his pain, SOB, nausea, diaphoresis, dizziness, hx of similar pain, hx of cardiac problems, hx of substance abuse, hx of DM, hx of HTN, any cholesterol problems, family hx of cardiac problems, recent heavy lifting, hx of acid reflux, hx of blood clots, any recent exertional activity and any other associated symptoms. No other complaints noted.   Past Medical History:  Diagnosis Date  . Diabetes mellitus without complication ([place])   . Hypertension    borderline  . Sleep apnea   . Snoring     Patient Active Problem List   Diagnosis Date Noted  . Morbid obesity due to excess calories ([place]) 11/06/2015  . Nocturia more than twice per night 11/06/2015  . Sleep deprivation 11/06/2015  . Hypersomnia with sleep apnea 11/06/2015  . OSA (obstructive sleep  apnea) 11/06/2015    History reviewed. No pertinent surgical history.  Home Medications    Prior to Admission medications   Medication Sig Start Date End Date Taking? Authorizing Provider  ibuprofen (ADVIL,MOTRIN) 600 MG tablet Take 1 tablet (600 mg total) by mouth every 6 (six) hours as needed. 05/18/16   Varney Biles, MD    Family History Family History  Problem Relation Age of Onset  . Diabetes Father   . Diabetes Brother     Social History Social History  Substance Use Topics  . Smoking status: Former Smoker    Quit date: 03/22/2008  . Smokeless tobacco: Never Used  . Alcohol use No     Allergies   Patient has no known allergies.   Review of Systems Review of Systems 10 systems reviewed and all are negative for acute change except as noted in the HPI.  Physical Exam Updated Vital Signs BP (!) 170/102   Pulse 82   Temp 97.6 F (36.4 C) (Oral)   Resp 17   Ht 6\' 2"  (1.88 m)   Wt (!) 327 lb (148.3 kg)   SpO2 97%   BMI 41.98 kg/m   Physical Exam  Constitutional: He is oriented to person, place, and time. He appears well-developed and well-nourished.  HENT:  Head: Normocephalic and atraumatic.  Eyes: EOM are normal.  Neck: Normal range of motion. No JVD present.  Cardiovascular: Normal rate, regular rhythm and normal heart sounds.  2+ and equal radial pulse bilaterally.   Pulmonary/Chest: Effort normal and breath sounds normal. No respiratory distress. He exhibits tenderness.  No murmur heard. No reproducible chest wall tenderness with movement.Tenderness not worse with forward flexion or adduction of the upper extremities. Tenderness reproduced with movement of the head laterally, left worse than right and with neck flexion.   Musculoskeletal: Normal range of motion.  Neurological: He is alert and oriented to person, place, and time.  Skin: Skin is warm and dry.  Psychiatric: He has a normal mood and affect. Judgment normal.  Nursing note and vitals  reviewed.   ED Treatments / Results  DIAGNOSTIC STUDIES:  Oxygen Saturation is 99% on RA, normal by my interpretation.    COORDINATION OF CARE:  2:10 AM Discussed treatment plan with pt at bedside and pt agreed to plan.  Labs (all labs ordered are listed, but only abnormal results are displayed) Labs Reviewed  BASIC METABOLIC PANEL - Abnormal; Notable for the following:       Result Value   Glucose, Bld 111 (*)    Calcium 8.8 (*)    All other components within normal limits  CBC - Abnormal; Notable for the following:    Hemoglobin 11.5 (*)    HCT 36.3 (*)    Platelets 144 (*)    All other components within normal limits  I-STAT TROPOININ, ED  I-STAT TROPOININ, ED    EKG  EKG Interpretation  Date/Time:  Tuesday May 18 2016 01:32:54 EST Ventricular Rate:  73 PR Interval:    QRS Duration: 116 QT Interval:  435 QTC Calculation: 480 R Axis:   -40 Text Interpretation:  Sinus rhythm Nonspecific IVCD with LAD Left ventricular hypertrophy No acute changes No significant change since last tracing Confirmed by Kathrynn Humble, MD, Thelma Comp [phone]) on 05/18/2016 1:43:39 AM       Radiology Dg Chest 2 View  Result Date: 05/18/2016 CLINICAL DATA:  Left and central chest pain. EXAM: CHEST  2 VIEW COMPARISON:  None. FINDINGS: The cardiomediastinal contours are normal. Heart is at the upper limits normal in size. Pulmonary vasculature is normal. No consolidation, pleural effusion, or pneumothorax. No acute osseous abnormalities are seen. IMPRESSION: No acute abnormality. Upper normal heart size with normal mediastinal contours. Electronically Signed   By: Jeb Levering M.D.   On: 05/18/2016 02:30    Procedures Procedures (including critical care time)  Medications Ordered in ED Medications  nitroGLYCERIN (NITROSTAT) SL tablet 0.4 mg (not administered)     Initial Impression / Assessment and Plan / ED Course  I have reviewed the triage vital signs and the nursing  notes.  Pertinent labs & imaging results that were available during my care of the patient were reviewed by me and considered in my medical decision making (see chart for details).     I personally performed the services described in this documentation, which was scribed in my presence. The recorded information has been reviewed and is accurate.   Differential diagnosis includes: ACS syndrome Aortic dissection Myocarditis Pericarditis Pneumothorax Musculoskeletal pain PUD / Gastritis / Esophagitis Esophageal spasm  PT comes in with cc of chest pain. Chest pain is very atypical, pt has no cardiac dz hx or CAD risk factors. Pain is L sided and moves towards the neck, but it is worse with certain positions. Pt has no hx of PE, DVT and denies any exogenous hormone (testosterone / estrogen) use, long distance travels or surgery in the past 6 weeks, active cancer, recent immobilization. There  is no trauma. There is no neck pain or any neuro complains or deficits - so we dont suspect dissection. trops x 2 ordered.  Final Clinical Impressions(s) / ED Diagnoses   Final diagnoses:  Atypical chest pain    New Prescriptions New Prescriptions   IBUPROFEN (ADVIL,MOTRIN) 600 MG TABLET    Take 1 tablet (600 mg total) by mouth every 6 (six) hours as needed.      Varney Biles, MD 05/18/16 [phone]

## 2016-05-18 NOTE — ED Notes (Signed)
Pt. returned from XR. 

## 2016-05-18 NOTE — ED Triage Notes (Signed)
Pt presents via EMS with sharp CP to central and left chest radiating to his back x 45 mins, worse with movement. Denies injury. Pt given 324 ASA and 1 NTG en route which mildly relieved pt's pain. Per EMS, 12-lead unremarkable, RR 14, 158/104, 97% on RA, HR 74. Pt A&O x4. CP 6/10 at this time.

## 2016-05-18 NOTE — Discharge Instructions (Signed)

## 2016-05-18 NOTE — ED Notes (Signed)
Patient transported to X-ray 

## 2016-05-18 NOTE — ED Notes (Signed)
ED Provider at bedside. 

## 2016-06-14 ENCOUNTER — Emergency Department (HOSPITAL_COMMUNITY): Payer: PRIVATE HEALTH INSURANCE

## 2016-06-14 ENCOUNTER — Emergency Department (HOSPITAL_COMMUNITY)
Admission: EM | Admit: 2016-06-14 | Discharge: 2016-06-14 | Disposition: A | Discharge status: Home / Self Care | Payer: PRIVATE HEALTH INSURANCE | Attending: Emergency Medicine | Admitting: Emergency Medicine

## 2016-06-14 DIAGNOSIS — Z5321 Procedure and treatment not carried out due to patient leaving prior to being seen by health care provider: Secondary | ICD-10-CM | POA: Insufficient documentation

## 2016-06-14 DIAGNOSIS — E119 Type 2 diabetes mellitus without complications: Secondary | ICD-10-CM | POA: Diagnosis not present

## 2016-06-14 DIAGNOSIS — R55 Syncope and collapse: Secondary | ICD-10-CM | POA: Diagnosis present

## 2016-06-14 DIAGNOSIS — R42 Dizziness and giddiness: Secondary | ICD-10-CM | POA: Insufficient documentation

## 2016-06-14 DIAGNOSIS — I10 Essential (primary) hypertension: Secondary | ICD-10-CM | POA: Diagnosis not present

## 2016-06-14 DIAGNOSIS — Z87891 Personal history of nicotine dependence: Secondary | ICD-10-CM | POA: Diagnosis not present

## 2016-06-14 LAB — CBC
HEMATOCRIT: 38.6 % — AB (ref 39.0–52.0)
Hemoglobin: 12.2 g/dL — ABNORMAL LOW (ref 13.0–17.0)
MCH: 26.2 pg (ref 26.0–34.0)
MCHC: 31.6 g/dL (ref 30.0–36.0)
MCV: 83 fL (ref 78.0–100.0)
Platelets: 201 10*3/uL (ref 150–400)
RBC: 4.65 MIL/uL (ref 4.22–5.81)
RDW: 12.4 % (ref 11.5–15.5)
WBC: 8.4 10*3/uL (ref 4.0–10.5)

## 2016-06-14 LAB — URINALYSIS, ROUTINE W REFLEX MICROSCOPIC
BILIRUBIN URINE: NEGATIVE
Bacteria, UA: NONE SEEN
GLUCOSE, UA: 50 mg/dL — AB
HGB URINE DIPSTICK: NEGATIVE
Ketones, ur: NEGATIVE mg/dL
LEUKOCYTES UA: NEGATIVE
NITRITE: NEGATIVE
PH: 6 (ref 5.0–8.0)
Protein, ur: 30 mg/dL — AB
SPECIFIC GRAVITY, URINE: 1.028 (ref 1.005–1.030)

## 2016-06-14 LAB — BASIC METABOLIC PANEL
ANION GAP: 8 (ref 5–15)
BUN: 9 mg/dL (ref 6–20)
CALCIUM: 9 mg/dL (ref 8.9–10.3)
CO2: 28 mmol/L (ref 22–32)
CREATININE: 0.92 mg/dL (ref 0.61–1.24)
Chloride: 104 mmol/L (ref 101–111)
Glucose, Bld: 116 mg/dL — ABNORMAL HIGH (ref 65–99)
Potassium: 3.9 mmol/L (ref 3.5–5.1)
Sodium: 140 mmol/L (ref 135–145)

## 2016-06-14 IMAGING — CT CT HEAD W/O CM
3 series · 16 of 47 positions shown, 19 images · non-contrast
Comparison: None.

CLINICAL DATA: Dizziness and unsteady gait.  Sleep apnea.

EXAM:
CT HEAD WITHOUT CONTRAST
TECHNIQUE: Contiguous axial images were obtained from the base of the skull
through the vertex without intravenous contrast.

[Series 3: head 5.0 h30s · axial · 0.47mm/px · z∈[+958,+1113]mm · 10 of 37 slices shown, 13 images]
[im 3/37  brain]
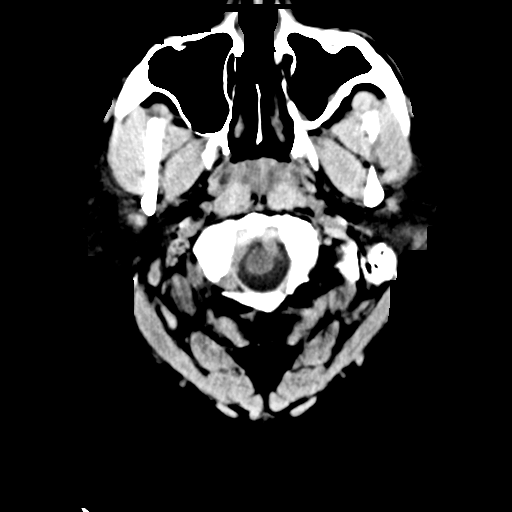
[im 3/37  bone]
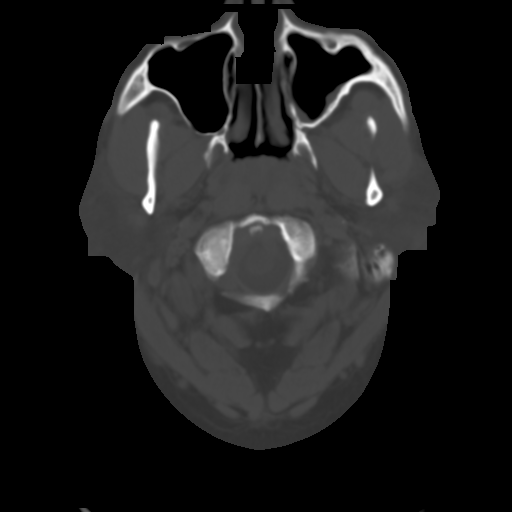
[im 7/37  brain]
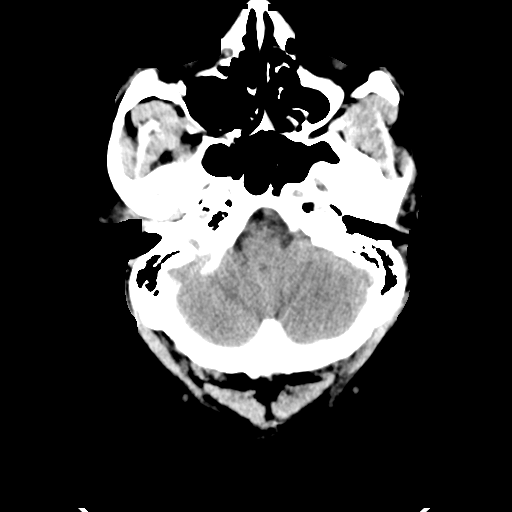
[im 10/37  brain]
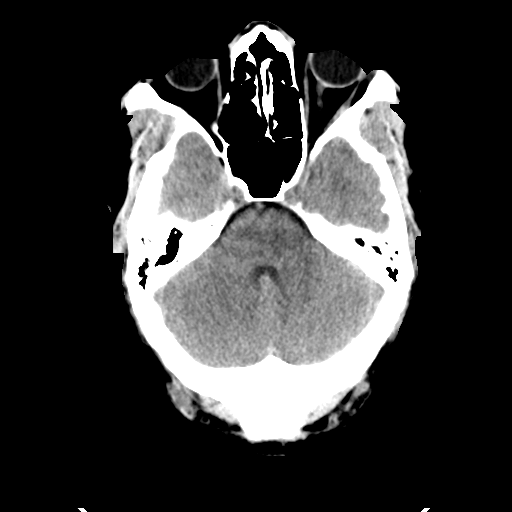
[im 13/37  brain]
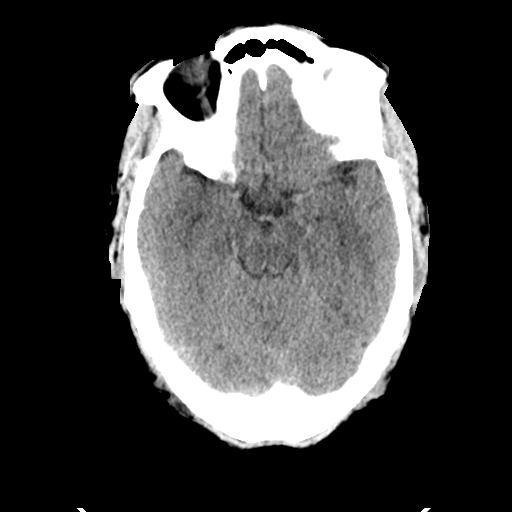
[im 17/37  brain]
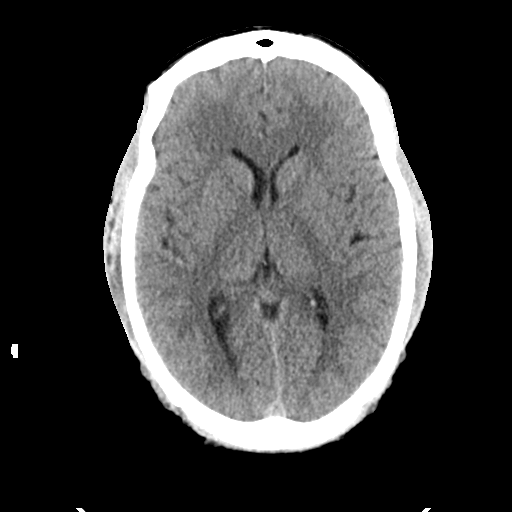
[im 17/37  bone]
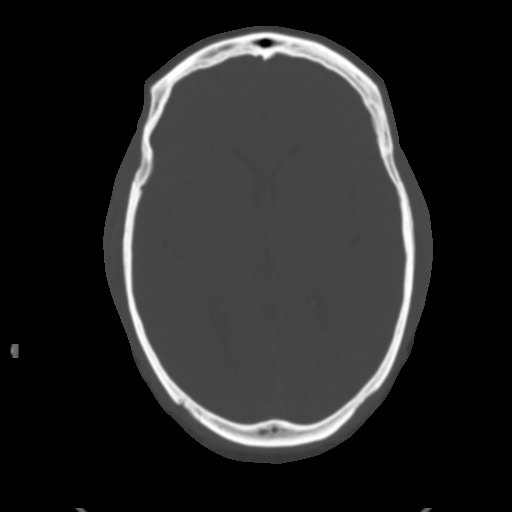
[im 20/37  brain]
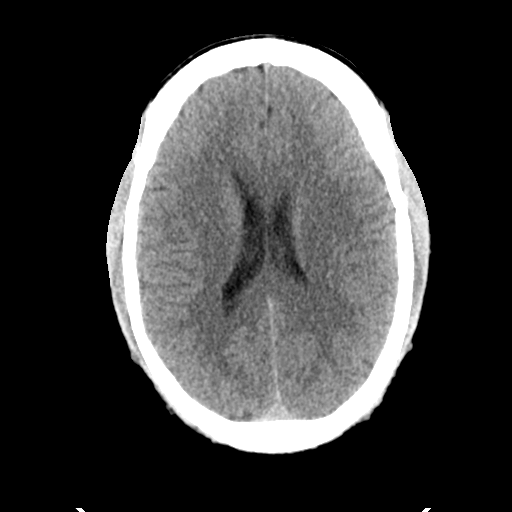
[im 24/37  brain]
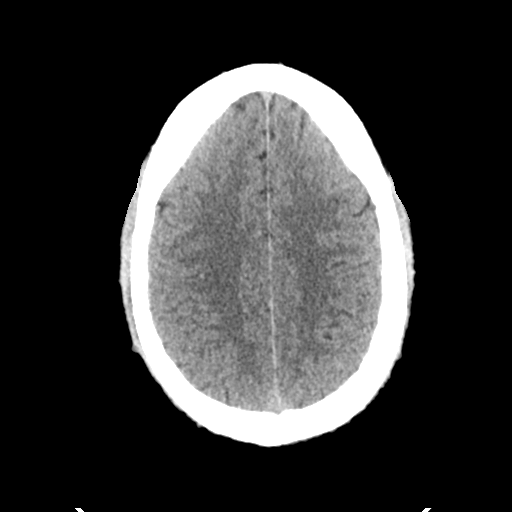
[im 28/37  brain]
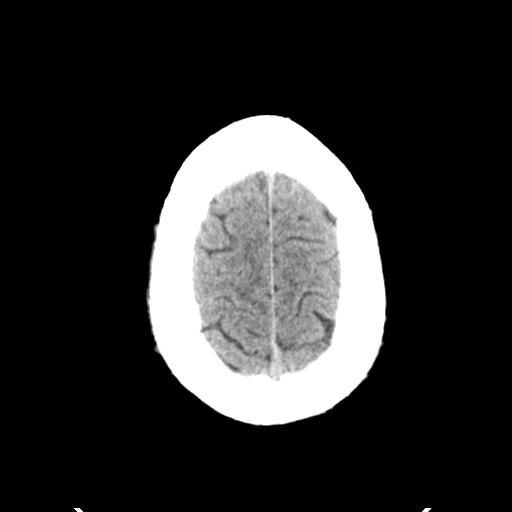
[im 30/37  brain]
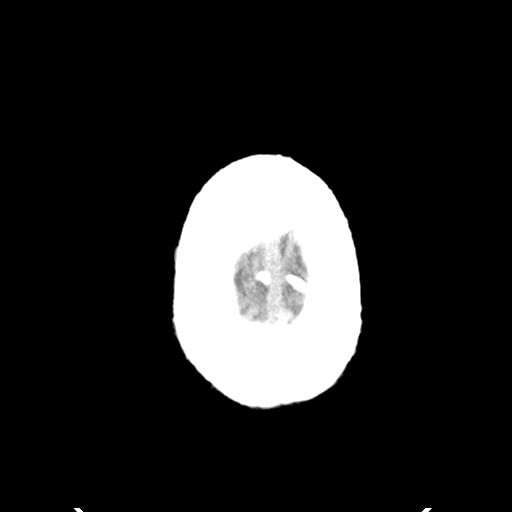
[im 30/37  bone]
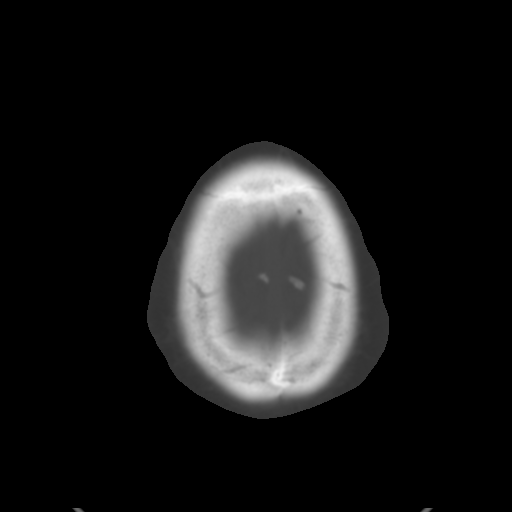
[im 34/37  brain]
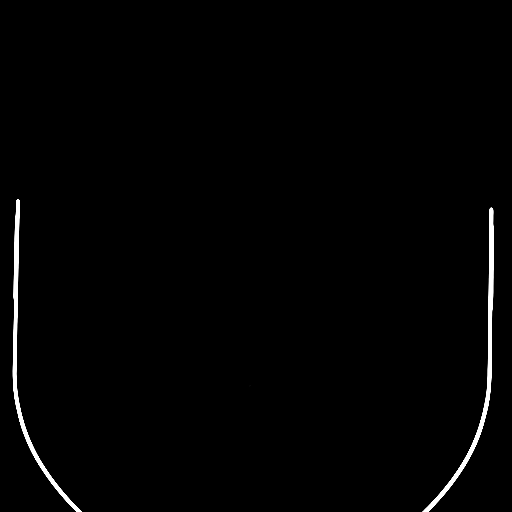

[Series 5: head 3.0 mpr cor · coronal · 0.35mm/px · 3 of 79 slices shown]
[im 27/79  brain]
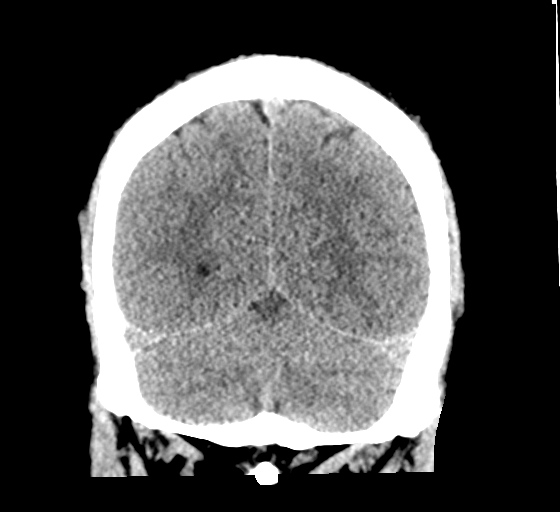
[im 35/79  brain]
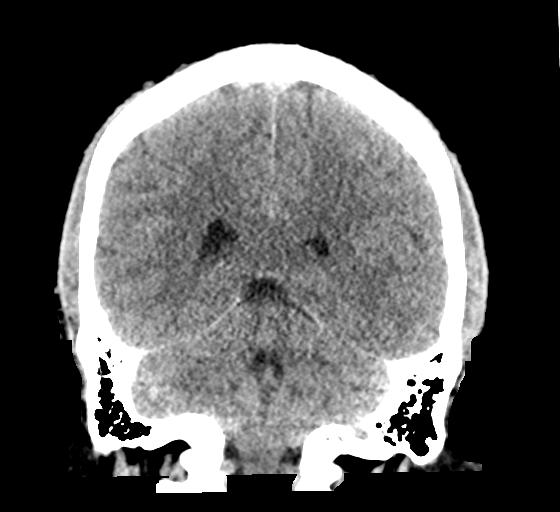
[im 44/79  brain]
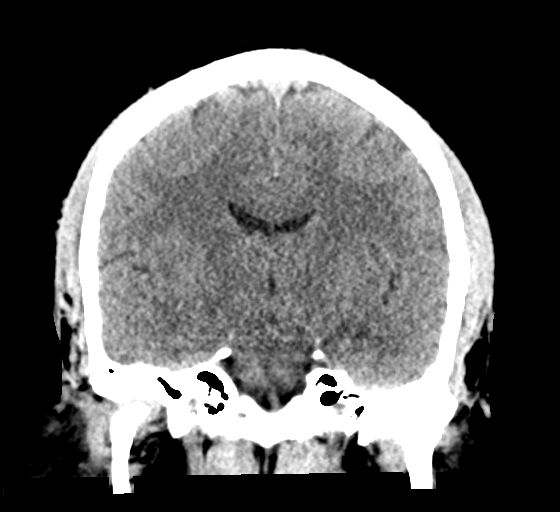

[Series 6: head 3.0 mpr sag · sagittal · 0.35mm/px · 3 of 67 slices shown]
[im 23/67  brain]
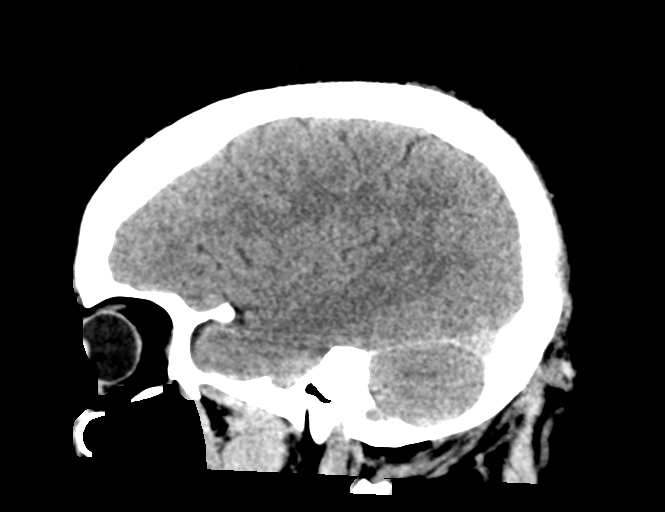
[im 34/67  brain]
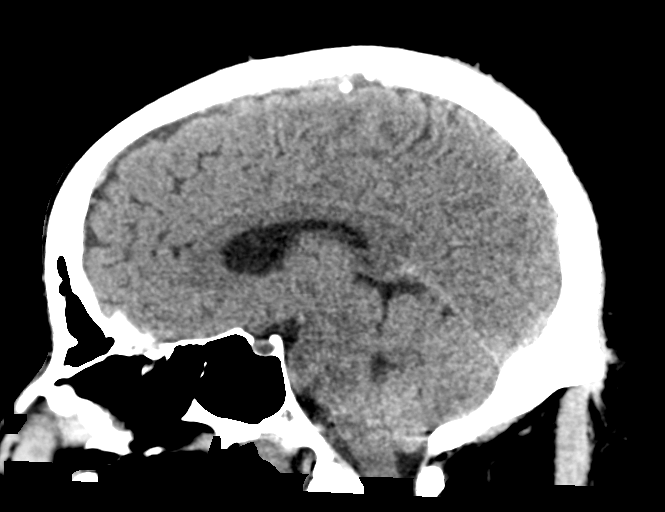
[im 45/67  brain]
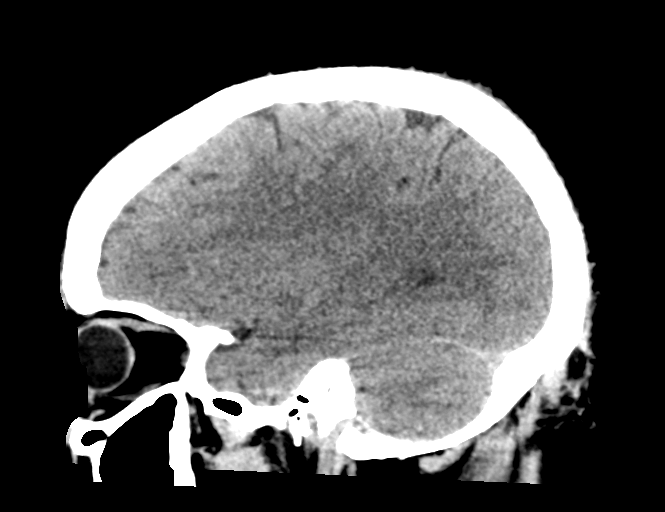

[16 of 47 positions shown; findings below may reference images not displayed]

FINDINGS: Brain: No intracranial hemorrhage, midline shift nor large vascular
territory infarction. No hydrocephalus. No intra-axial mass nor
extra-axial fluid collections.

Vascular: No hyperdense vessel or unexpected calcification.

Skull: Normal. Negative for fracture or focal lesion.

Sinuses/Orbits: Minimal mucosal thickening along the left maxillary
sinus. Mild mucosal thickening of the ethmoid sinus. No air-fluid
levels. Clear mastoids. Intact orbits bilaterally.

Other: None.
IMPRESSION: No acute intracranial abnormality. Mild paranasal sinus mucosal
thickening of the left maxillary and ethmoid sinuses.

## 2016-06-14 MED ORDER — MECLIZINE HCL 25 MG PO TABS: 25.0000 mg | ORAL_TABLET | Freq: Three times a day (TID) | ORAL | 0 refills | Status: DC | PRN

## 2016-06-14 MED ORDER — MECLIZINE HCL 25 MG PO TABS
25.0000 mg | ORAL_TABLET | Freq: Once | ORAL | Status: AC
Start: 2016-06-14 — End: 2016-06-14
  Administered 2016-06-14: 25 mg via ORAL
  Filled 2016-06-14: qty 1

## 2016-06-14 MED ORDER — SODIUM CHLORIDE 0.9 % IV BOLUS (SEPSIS)
1000.0000 mL | Freq: Once | INTRAVENOUS | Status: AC
  Administered 2016-06-14: 1000 mL via INTRAVENOUS

## 2016-06-14 MED ORDER — HYDROCHLOROTHIAZIDE 25 MG PO TABS: 25.0000 mg | ORAL_TABLET | Freq: Every day | ORAL | 0 refills | Status: DC

## 2016-06-14 NOTE — ED Provider Notes (Cosign Needed)
[facility] DEPT Provider Note   CSN: [id number] Arrival date & time: 06/14/16  1602     History   Chief Complaint Chief Complaint  Patient presents with  . Near Syncope    HPI Luis Lopez is a 41 y.o. male.  HPI   Pt with hx DM, HTN, sleep apnea p/w episode of dizziness (spinning) that began suddenly at 3pm today.  He was getting ready for work, arguing with his significant other, when the symptoms began.  He was able to stumble back and fall on his bed.  Reports he feels somewhat better but still feels off balance with walking.  Is working 72 hours/week, high stress, recently had two family members murdered.  Denies any CP, SOB, diaphoresis, N/V, weakness or numbness.    Past Medical History:  Diagnosis Date  . Diabetes mellitus without complication ([place])   . Hypertension    borderline  . Sleep apnea   . Snoring     Patient Active Problem List   Diagnosis Date Noted  . Morbid obesity due to excess calories ([place]) 11/06/2015  . Nocturia more than twice per night 11/06/2015  . Sleep deprivation 11/06/2015  . Hypersomnia with sleep apnea 11/06/2015  . OSA (obstructive sleep apnea) 11/06/2015    No past surgical history on file.     Home Medications    Prior to Admission medications   Medication Sig Start Date End Date Taking? Authorizing Provider  hydrochlorothiazide (HYDRODIURIL) 25 MG tablet Take 1 tablet (25 mg total) by mouth daily. 06/14/16   Clayton Bibles, PA-C  ibuprofen (ADVIL,MOTRIN) 600 MG tablet Take 1 tablet (600 mg total) by mouth every 6 (six) hours as needed. 05/18/16   Varney Biles, MD  meclizine (ANTIVERT) 25 MG tablet Take 1 tablet (25 mg total) by mouth 3 (three) times daily as needed for dizziness. 06/14/16   Clayton Bibles, PA-C    Family History Family History  Problem Relation Age of Onset  . Diabetes Father   . Diabetes Brother     Social History Social History  Substance Use Topics  . Smoking status: Former Smoker    Quit date:  03/22/2008  . Smokeless tobacco: Never Used  . Alcohol use No     Allergies   Patient has no known allergies.   Review of Systems Review of Systems  All other systems reviewed and are negative.    Physical Exam Updated Vital Signs BP (!) 145/92 (BP Location: Right Arm)   Pulse 77   Temp 97.7 F (36.5 C) (Oral)   Resp 15   Ht 6' 2.5" (1.892 m)   Wt (!) 147 kg   SpO2 100%   BMI 41.04 kg/m   Physical Exam  Constitutional: He appears well-developed and well-nourished. No distress.  HENT:  Head: Normocephalic and atraumatic.  Neck: Neck supple.  Cardiovascular: Normal rate and regular rhythm.   Pulmonary/Chest: Effort normal and breath sounds normal. No respiratory distress. He has no wheezes. He has no rales.  Abdominal: Soft. He exhibits no distension and no mass. There is no tenderness. There is no rebound and no guarding.  Neurological: He is alert. He exhibits normal muscle tone.  CN II-XII intact, EOMs intact, no pronator drift, grip strengths equal bilaterally; strength 5/5 in all extremities, sensation intact in all extremities; finger to nose, heel to shin, rapid alternating movements normal.  Gait testing deferred.      Skin: He is not diaphoretic.  Nursing note and vitals reviewed.    ED  Treatments / Results  Labs (all labs ordered are listed, but only abnormal results are displayed) Labs Reviewed  BASIC METABOLIC PANEL - Abnormal; Notable for the following:       Result Value   Glucose, Bld 116 (*)    All other components within normal limits  URINALYSIS, ROUTINE W REFLEX MICROSCOPIC - Abnormal; Notable for the following:    Glucose, UA 50 (*)    Protein, ur 30 (*)    Squamous Epithelial / LPF 0-5 (*)    All other components within normal limits  CBC - Abnormal; Notable for the following:    Hemoglobin 12.2 (*)    HCT 38.6 (*)    All other components within normal limits    EKG  EKG Interpretation  Date/Time:  Monday June 14 2016 16:18:41  EDT Ventricular Rate:  84 PR Interval:  152 QRS Duration: 110 QT Interval:  374 QTC Calculation: 441 R Axis:   -32 Text Interpretation:  Normal sinus rhythm Left axis deviation Moderate voltage criteria for LVH, may be normal variant Abnormal ECG Confirmed by Hazle Coca [phone]) on 06/14/2016 8:00:50 PM       Radiology Ct Head Wo Contrast  Result Date: 06/14/2016 CLINICAL DATA:  Dizziness and unsteady gait.  Sleep apnea. EXAM: CT HEAD WITHOUT CONTRAST TECHNIQUE: Contiguous axial images were obtained from the base of the skull through the vertex without intravenous contrast. COMPARISON:  None. FINDINGS: Brain: No intracranial hemorrhage, midline shift nor large vascular territory infarction. No hydrocephalus. No intra-axial mass nor extra-axial fluid collections. Vascular: No hyperdense vessel or unexpected calcification. Skull: Normal. Negative for fracture or focal lesion. Sinuses/Orbits: Minimal mucosal thickening along the left maxillary sinus. Mild mucosal thickening of the ethmoid sinus. No air-fluid levels. Clear mastoids. Intact orbits bilaterally. Other: None. IMPRESSION: No acute intracranial abnormality. Mild paranasal sinus mucosal thickening of the left maxillary and ethmoid sinuses. Electronically Signed   By: Ashley Royalty M.D.   On: 06/14/2016 19:47    Procedures Procedures (including critical care time)  Medications Ordered in ED Medications  sodium chloride 0.9 % bolus 1,000 mL (1,000 mLs Intravenous New Bag/Given 06/14/16 1948)  meclizine (ANTIVERT) tablet 25 mg (25 mg Oral Given 06/14/16 1948)     Initial Impression / Assessment and Plan / ED Course  I have reviewed the triage vital signs and the nursing notes.  Pertinent labs & imaging results that were available during my care of the patient were reviewed by me and considered in my medical decision making (see chart for details).  Clinical Course as of Jun 15 2018  Mon Jun 14, 2016  2010 Pt reports he got up and  walked without symptoms.  No dizziness, not off balance.  Feeling much better.    [EW]    Clinical Course User Index [EW] Clayton Bibles, PA-C    Afebrile, nontoxic patient with episode of dizziness that has resolved.  Pt has been hypertensive for months.  Has PCP appt in 3 days.  Discussed starting antihypertensives now vs waiting and pt and his significant other prefer starting now.  Workup remarkable for mild anemia, mild hyperglycemia.  CT head negative.  Pt feeling better and became asymptomatic.  Doubt acute stroke.  Discussed strict return precautions.    D/C home with HCTZ, meclizine, close PCP follow up.   Discussed result, findings, treatment, and follow up  with patient.  Pt given return precautions.  Pt verbalizes understanding and agrees with plan.       Final Clinical  Impressions(s) / ED Diagnoses   Final diagnoses:  Dizziness  Hypertension, unspecified type    New Prescriptions New Prescriptions   HYDROCHLOROTHIAZIDE (HYDRODIURIL) 25 MG TABLET    Take 1 tablet (25 mg total) by mouth daily.   MECLIZINE (ANTIVERT) 25 MG TABLET    Take 1 tablet (25 mg total) by mouth 3 (three) times daily as needed for dizziness.     Clayton Bibles, PA-C 06/14/16 2021

## 2016-06-14 NOTE — ED Notes (Signed)
Pt departed ED ambulatory after providing Urine sample for uds with employer

## 2016-06-14 NOTE — ED Triage Notes (Signed)
Pt reports near syncopal episode today around 1530. He states he was walking, became dizzy and the room was spinning, he lost balance but was able to lay down on his bed. NO fall. No LOC. A&Ox4.

## 2016-06-14 NOTE — Discharge Instructions (Signed)
Read the information below.  You may return to the Emergency Department at any time for worsening condition or any new symptoms that concern you.   If you develop worsening dizziness, or any chest pain, shortness of breath, fever, you pass out, or become weak or dizzy, return to the ER for a recheck.

## 2016-07-05 DIAGNOSIS — I10 Essential (primary) hypertension: Secondary | ICD-10-CM | POA: Diagnosis not present

## 2016-07-05 DIAGNOSIS — G4730 Sleep apnea, unspecified: Secondary | ICD-10-CM | POA: Diagnosis not present

## 2016-08-02 ENCOUNTER — Emergency Department (HOSPITAL_COMMUNITY): Payer: BLUE CROSS/BLUE SHIELD

## 2016-08-02 ENCOUNTER — Emergency Department (HOSPITAL_COMMUNITY)
Admission: EM | Admit: 2016-08-02 | Discharge: 2016-08-02 | Disposition: A | Discharge status: Home / Self Care | Payer: BLUE CROSS/BLUE SHIELD | Attending: Emergency Medicine | Admitting: Emergency Medicine

## 2016-08-02 ENCOUNTER — Encounter (HOSPITAL_COMMUNITY): Payer: Self-pay | Admitting: Emergency Medicine

## 2016-08-02 DIAGNOSIS — M545 Low back pain, unspecified: Secondary | ICD-10-CM

## 2016-08-02 DIAGNOSIS — Z79899 Other long term (current) drug therapy: Secondary | ICD-10-CM | POA: Insufficient documentation

## 2016-08-02 DIAGNOSIS — E119 Type 2 diabetes mellitus without complications: Secondary | ICD-10-CM | POA: Diagnosis not present

## 2016-08-02 DIAGNOSIS — Z87891 Personal history of nicotine dependence: Secondary | ICD-10-CM | POA: Insufficient documentation

## 2016-08-02 IMAGING — DX DG LUMBAR SPINE COMPLETE 4+V
5 series · 5 of 5 positions shown · non-contrast
Comparison: None.

CLINICAL DATA: Lower back pain X 2 days straight across lower back
per pt. Pt states his pain is worse with bending down.

EXAM:
LUMBAR SPINE - COMPLETE 4+ VIEW

[l-spine ap]
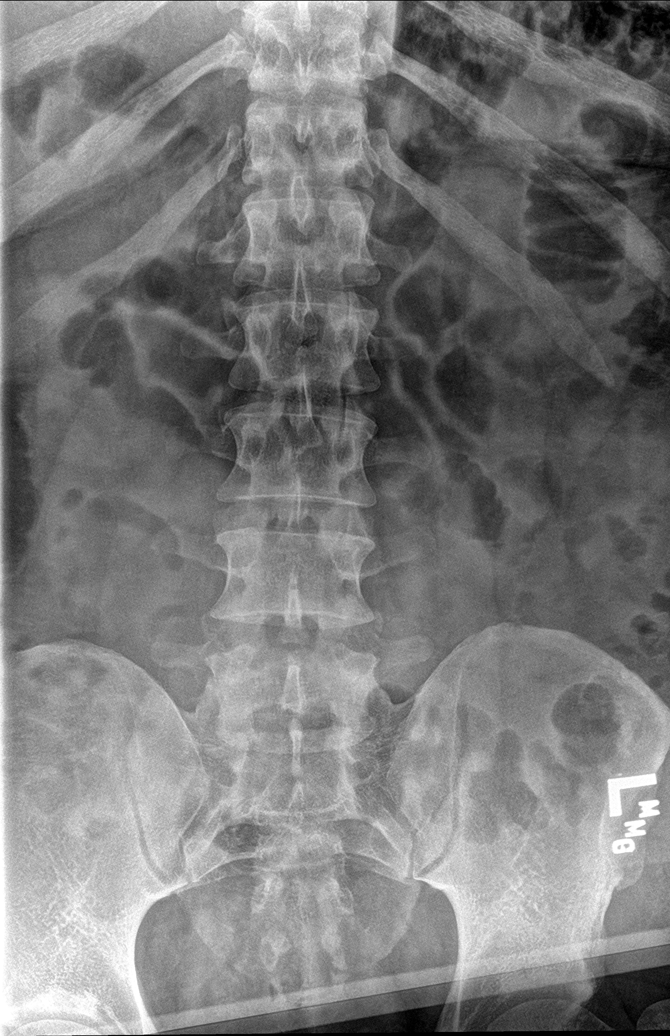

[l-spine obl (1 of 2)]
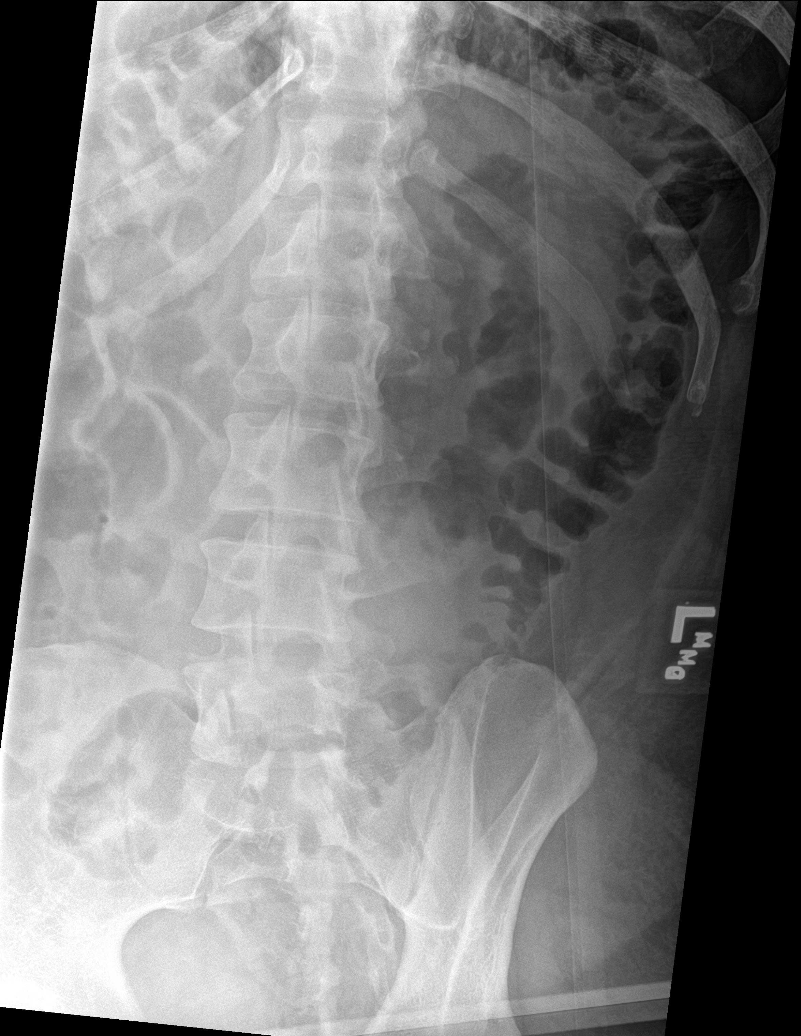

[l-spine obl (2 of 2)]
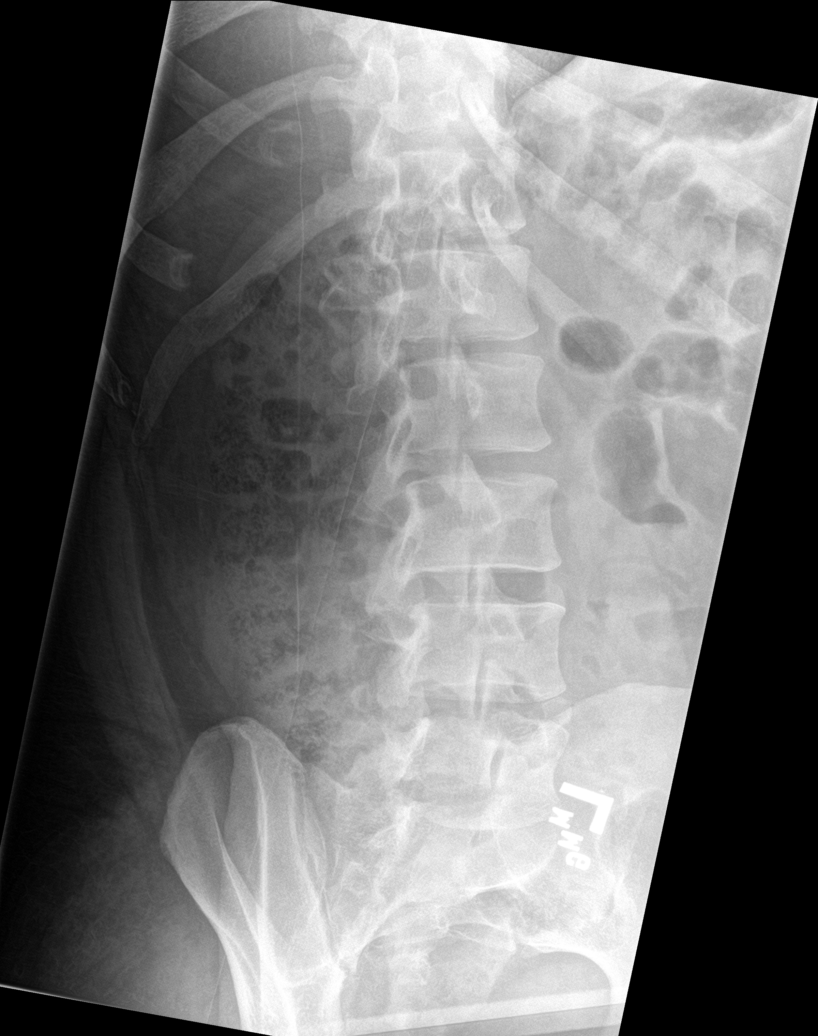

[l-spine lat]
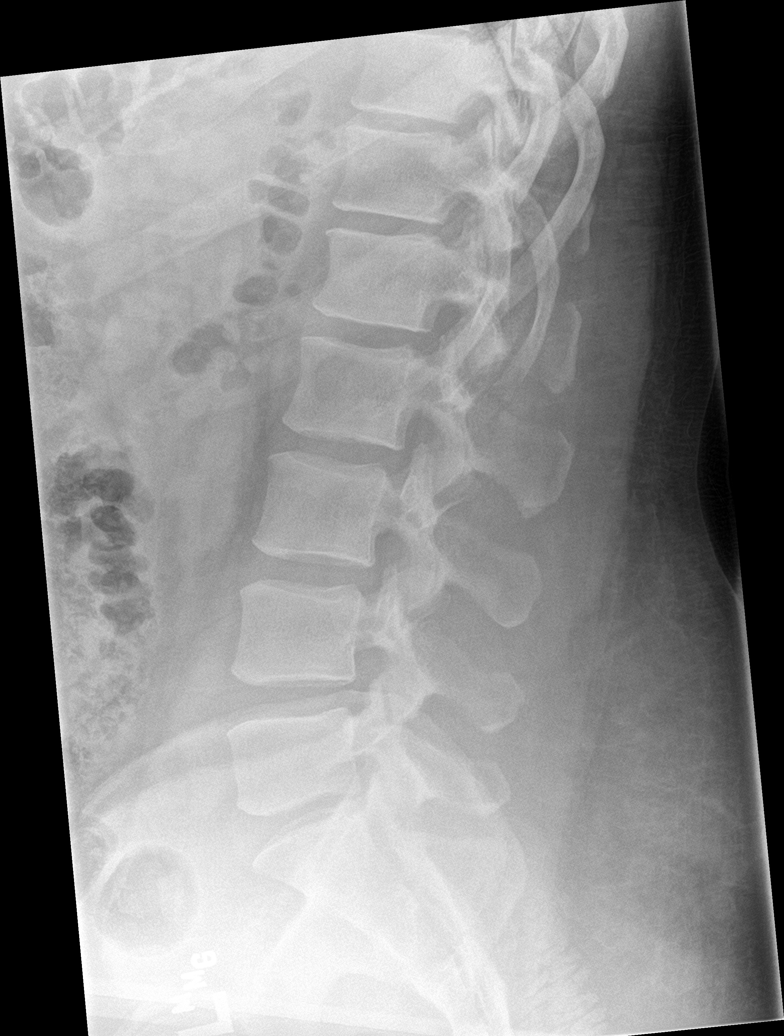

[l-spine spot]
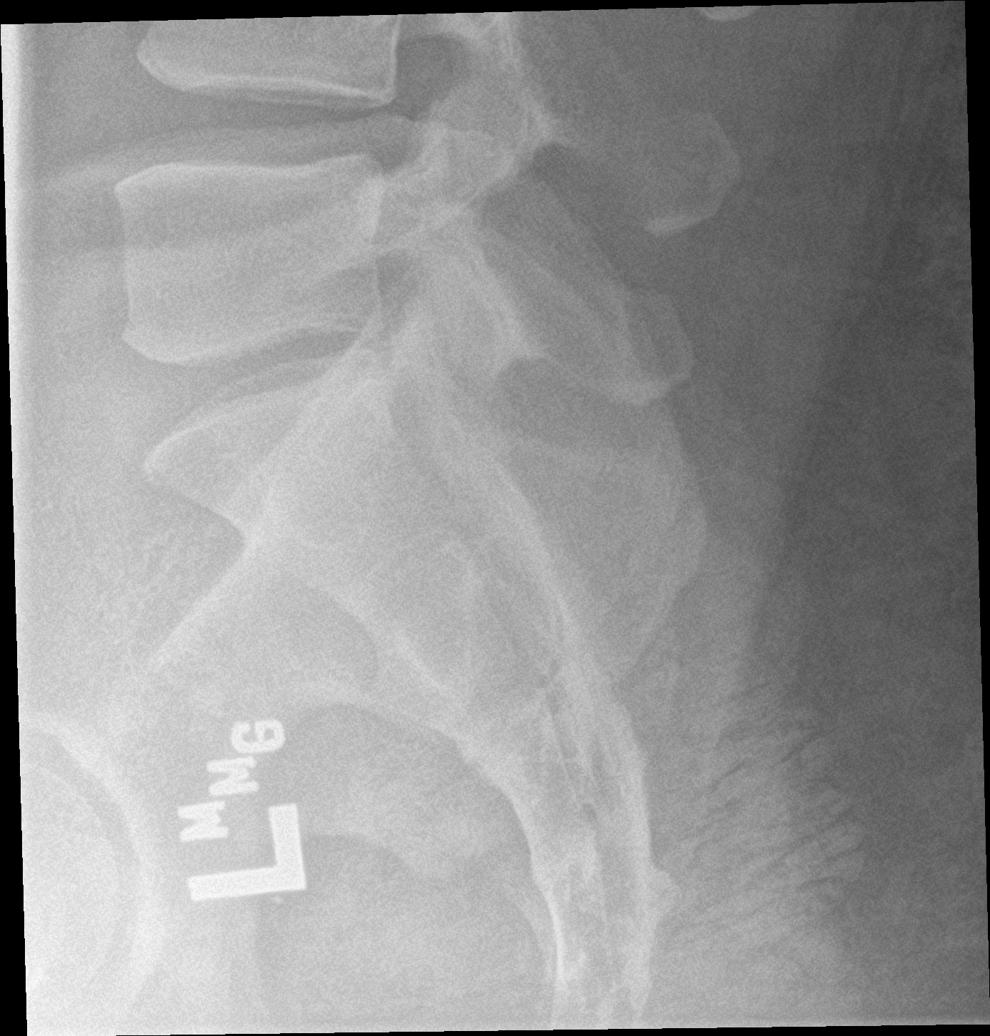

[5 of 5 positions shown; findings below may reference images not displayed]

FINDINGS: There is no evidence of lumbar spine fracture. Alignment is normal.
Intervertebral disc spaces are maintained.
IMPRESSION: Negative.

## 2016-08-02 MED ORDER — METHOCARBAMOL 500 MG PO TABS: 500.0000 mg | ORAL_TABLET | Freq: Two times a day (BID) | ORAL | 0 refills | Status: DC | PRN

## 2016-08-02 MED ORDER — NAPROXEN 500 MG PO TABS: 500.0000 mg | ORAL_TABLET | Freq: Two times a day (BID) | ORAL | 0 refills | Status: DC | PRN

## 2016-08-02 NOTE — ED Triage Notes (Signed)
Pt reports old back injury a few years ago, states that 2 days ago lower back was hurting upon him waking. Pt denies any new injury, no numbness, tingling or loss of bladder or bowel.

## 2016-08-02 NOTE — ED Provider Notes (Signed)
[facility] DEPT Provider Note   CSN: [id number] Arrival date & time: 08/02/16  1635  By signing my name below, I, Luis Lopez and Luis Lopez, attest that this documentation has been prepared under the direction and in the presence of  [facility], PA-C. Electronically Signed: Catarina Lopez, ED Scribe. 08/02/16. 8:43 PM.   History   Chief Complaint Chief Complaint  Patient presents with  . Back Pain    HPI Luis Lopez is a 41 y.o. male presents to the Emergency Department complaining of aching, throbbing lower back pain that began 2 days ago upon waking. He denies recent trauma, falls or heavy lifting. Per pt, his pain is worsened in the morning and with bending over, and alleviated with sitting up and OTC topical ointment. He has a previous history of similar back pain after injury 2 years ago that occurred during heavy lifting and resolved gradually on its own. Denies radiating pain down legs, abdominal pain, fever, dysuria, bowel or bladder incontinence, numbness/tingling, saddle anesthesia.    The history is provided by the patient. No language interpreter was used.    Past Medical History:  Diagnosis Date  . Diabetes mellitus without complication ([place])   . Hypertension    borderline  . Sleep apnea   . Snoring     Patient Active Problem List   Diagnosis Date Noted  . Morbid obesity due to excess calories ([place]) 11/06/2015  . Nocturia more than twice per night 11/06/2015  . Sleep deprivation 11/06/2015  . Hypersomnia with sleep apnea 11/06/2015  . OSA (obstructive sleep apnea) 11/06/2015    History reviewed. No pertinent surgical history.     Home Medications    Prior to Admission medications   Medication Sig Start Date End Date Taking? Authorizing Provider  hydrochlorothiazide (HYDRODIURIL) 25 MG tablet Take 1 tablet (25 mg total) by mouth daily. 06/14/16   Clayton Bibles, PA-C  ibuprofen (ADVIL,MOTRIN) 600 MG tablet Take 1  tablet (600 mg total) by mouth every 6 (six) hours as needed. 05/18/16   Varney Biles, MD  meclizine (ANTIVERT) 25 MG tablet Take 1 tablet (25 mg total) by mouth 3 (three) times daily as needed for dizziness. 06/14/16   Clayton Bibles, PA-C  methocarbamol (ROBAXIN) 500 MG tablet Take 1 tablet (500 mg total) by mouth 2 (two) times daily as needed for muscle spasms. 08/02/16   Ward, Ozella Almond, PA-C  naproxen (NAPROSYN) 500 MG tablet Take 1 tablet (500 mg total) by mouth 2 (two) times daily as needed for mild pain or moderate pain. 08/02/16   Ward, Ozella Almond, PA-C    Family History Family History  Problem Relation Age of Onset  . Diabetes Father   . Diabetes Brother     Social History Social History  Substance Use Topics  . Smoking status: Former Smoker    Quit date: 03/22/2008  . Smokeless tobacco: Never Used  . Alcohol use No     Allergies   Patient has no known allergies.   Review of Systems Review of Systems  Constitutional: Negative for fever.  Gastrointestinal:       No bowel incontinence   Genitourinary: Negative for dysuria.       No bladder incontinence or saddle anesthesia   Musculoskeletal: Positive for back pain.  Neurological: Negative for weakness and numbness.  All other systems reviewed and are negative.    Physical Exam Updated Vital Signs BP 133/89 (BP Location: Right Arm)   Pulse 66   Temp  98.1 F (36.7 C) (Oral)   Resp 15   SpO2 100%   Physical Exam  Constitutional: He is oriented to person, place, and time. He appears well-developed and well-nourished.  Neck:  No midline or paraspinal tenderness. Full ROM without pain.  Cardiovascular: Normal rate, regular rhythm, normal heart sounds and intact distal pulses.   Pulmonary/Chest: Effort normal and breath sounds normal. No respiratory distress.  Abdominal: Soft. Bowel sounds are normal. He exhibits no distension. There is no tenderness.  Musculoskeletal:  Tenderness to palpation as depicted in  image. 5/5 muscle strength in bilateral LE's. Straight leg raises are negative bilaterally for radicular symptoms. Able to ambulate independently with steady gait.  Neurological: He is alert and oriented to person, place, and time. He has normal reflexes.  Bilateral lower extremities neurovascularly intact.  Skin: Skin is warm and dry. No rash noted. No erythema.  Nursing note and vitals reviewed.    ED Treatments / Results  DIAGNOSTIC STUDIES: Oxygen Saturation is 98% on RA by my normal interpretation.   COORDINATION OF CARE: 8:33 PM-Discussed next steps with pt. Pt verbalized understanding and is agreeable with the plan. Xray will be ordered.  Labs (all labs ordered are listed, but only abnormal results are displayed) Labs Reviewed - No data to display  EKG  EKG Interpretation None       Radiology Dg Lumbar Spine Complete  Result Date: 08/02/2016 CLINICAL DATA:  Lower back pain X 2 days straight across lower back per pt. Pt states his pain is worse with bending down. EXAM: LUMBAR SPINE - COMPLETE 4+ VIEW COMPARISON:  None. FINDINGS: There is no evidence of lumbar spine fracture. Alignment is normal. Intervertebral disc spaces are maintained. IMPRESSION: Negative. Electronically Signed   By: Nolon Nations M.D.   On: 08/02/2016 21:38    Procedures Procedures (including critical care time)  Medications Ordered in ED Medications - No data to display   Initial Impression / Assessment and Plan / ED Course  I have reviewed the triage vital signs and the nursing notes.  Pertinent labs & imaging results that were available during my care of the patient were reviewed by me and considered in my medical decision making (see chart for details).    Luis Lopez is a 41 y.o. male who presents to ED for low back pain.   Patient demonstrates no lower extremity weakness, saddle anesthesia, bowel or bladder incontinence or neuro deficits. No concern for cauda equina. No fevers or  other infectious symptoms to suggest that the patient's back pain is due to an infection. Lower extremities are neurovascularly intact and patient is ambulating without difficulty. X-ray negative. I have reviewed return precautions, including the development of any of these signs or symptoms and the patient has voiced understanding. I reviewed supportive care instructions including NSAIDs, early range of motion exercises and PCP follow-up if symptoms do not improve. RX for Robaxin given. Patient voiced understanding and agreement with plan. All questions answered.   Final Clinical Impressions(s) / ED Diagnoses   Final diagnoses:  Acute bilateral low back pain without sciatica    New Prescriptions Discharge Medication List as of 08/02/2016  9:50 PM    START taking these medications   Details  methocarbamol (ROBAXIN) 500 MG tablet Take 1 tablet (500 mg total) by mouth 2 (two) times daily as needed for muscle spasms., Starting Mon 08/02/2016, Print    naproxen (NAPROSYN) 500 MG tablet Take 1 tablet (500 mg total) by mouth 2 (two) times daily  as needed for mild pain or moderate pain., Starting Mon 08/02/2016, Print       I personally performed the services described in this documentation, which was scribed in my presence. The recorded information has been reviewed and is accurate.    Ward, Ozella Almond, PA-C 08/02/16 [place], Ankit, MD 08/06/16 [phone]

## 2016-08-02 NOTE — Discharge Instructions (Signed)
It was my pleasure taking care of you today!   You have been seen in the Emergency Department today for back pain.   Naproxen as needed for pain. Robaxin is your muscle relaxer. In addition to this, use ice and/or heat for additional pain relief.  Your back pain should get better over the next 2 weeks. Please follow up with your doctor this week for a recheck if still having symptoms.  COLD THERAPY DIRECTIONS:  Ice or gel packs can be used to reduce both pain and swelling. Ice is the most helpful within the first 24 to 48 hours after an injury or flareup from overusing a muscle or joint.  Ice is effective, has very few side effects, and is safe for most people to use.    Return to the ED for worsening back pain, fever, weakness or numbness of either leg, or if you develop either (1) an inability to urinate or have bowel movements, or (2) loss of your ability to control your bathroom functions (if you start having "accidents"), or if you develop other new symptoms that concern you.

## 2016-08-02 NOTE — ED Notes (Signed)
Patient transported to X-ray 

## 2016-08-09 DIAGNOSIS — G4730 Sleep apnea, unspecified: Secondary | ICD-10-CM | POA: Diagnosis not present

## 2016-08-09 DIAGNOSIS — M545 Low back pain: Secondary | ICD-10-CM | POA: Diagnosis not present

## 2016-08-09 DIAGNOSIS — I10 Essential (primary) hypertension: Secondary | ICD-10-CM | POA: Diagnosis not present

## 2016-10-25 ENCOUNTER — Encounter (HOSPITAL_COMMUNITY): Payer: Self-pay | Admitting: Emergency Medicine

## 2016-10-25 ENCOUNTER — Ambulatory Visit (HOSPITAL_COMMUNITY)
Admission: EM | Admit: 2016-10-25 | Discharge: 2016-10-25 | Disposition: A | Discharge status: Home / Self Care | Payer: BLUE CROSS/BLUE SHIELD | Attending: Internal Medicine | Admitting: Internal Medicine

## 2016-10-25 DIAGNOSIS — K1379 Other lesions of oral mucosa: Secondary | ICD-10-CM | POA: Diagnosis not present

## 2016-10-25 MED ORDER — AMOXICILLIN-POT CLAVULANATE 875-125 MG PO TABS: 1.0000 | ORAL_TABLET | Freq: Two times a day (BID) | ORAL | 0 refills | Status: DC

## 2016-10-25 MED ORDER — LIDOCAINE VISCOUS 2 % MT SOLN: 15.0000 mL | OROMUCOSAL | 0 refills | Status: DC | PRN

## 2016-10-25 NOTE — ED Triage Notes (Signed)
Patient points to areas of roof of mouth and under tongue that patient thinks is swollen.  Patient says these areas appear different since two weeks ago

## 2016-10-25 NOTE — Discharge Instructions (Signed)
Start Augmentin for possible dental abscess. Lidocaine solution for pain. Take ibuprofen 600-800 mg 3 times a day for the next 10 days for pain and inflammation. Follow-up with dentist for dental abnormalities. Follow-up with PCP for further workup if sores do not heal. Monitor for worsening of symptoms, trouble breathing, trouble swallowing, swelling of the throat, good to the ED for further evaluation.

## 2016-10-25 NOTE — ED Provider Notes (Signed)
CSN: [id number]     Arrival date & time 10/25/16  9417 History   None    Chief Complaint  Patient presents with  . Sore   (Consider location/radiation/quality/duration/timing/severity/associated sxs/prior Treatment) 41 year old male with history of hypertension comes in with 2 week history of swelling/sores in mouth. States there were 2 on the roof of the mouth, and some on the floor. Denies fever, chills, night sweats. Denies other URI symptoms such as cough, sore throat, congestion. Denies abdominal pain, N/V/D. No history of mouth sores/ulcers. Has not seen a dentist in many years.       Past Medical History:  Diagnosis Date  . Hypertension    borderline  . Sleep apnea   . Snoring    History reviewed. No pertinent surgical history. Family History  Problem Relation Age of Onset  . Diabetes Father   . Diabetes Brother    Social History  Substance Use Topics  . Smoking status: Former Smoker    Quit date: 03/22/2008  . Smokeless tobacco: Never Used  . Alcohol use No    Review of Systems  Reason unable to perform ROS: See HPI as above.    Allergies  Patient has no known allergies.  Home Medications   Prior to Admission medications   Medication Sig Start Date End Date Taking? Authorizing Provider  hydrochlorothiazide (HYDRODIURIL) 25 MG tablet Take 1 tablet (25 mg total) by mouth daily. 06/14/16  Yes West, Emily, PA-C  amoxicillin-clavulanate (AUGMENTIN) 875-125 MG tablet Take 1 tablet by mouth every 12 (twelve) hours. 10/25/16   Tasia Catchings, Malyia Moro V, PA-C  ibuprofen (ADVIL,MOTRIN) 600 MG tablet Take 1 tablet (600 mg total) by mouth every 6 (six) hours as needed. 05/18/16   Varney Biles, MD  lidocaine (XYLOCAINE) 2 % solution Use as directed 15 mLs in the mouth or throat as needed for mouth pain. 10/25/16   Ok Edwards, PA-C  meclizine (ANTIVERT) 25 MG tablet Take 1 tablet (25 mg total) by mouth 3 (three) times daily as needed for dizziness. 06/14/16   Clayton Bibles, PA-C  methocarbamol  (ROBAXIN) 500 MG tablet Take 1 tablet (500 mg total) by mouth 2 (two) times daily as needed for muscle spasms. 08/02/16   Ward, Ozella Almond, PA-C  naproxen (NAPROSYN) 500 MG tablet Take 1 tablet (500 mg total) by mouth 2 (two) times daily as needed for mild pain or moderate pain. 08/02/16   Ward, Ozella Almond, PA-C   Meds Ordered and Administered this Visit  Medications - No data to display  BP 133/79 (BP Location: Right Arm) Comment (BP Location): large cuff  Pulse 88   Temp 98.6 F (37 C) (Oral)   Resp 18   SpO2 98%  No data found.   Physical Exam  Constitutional: He is oriented to person, place, and time. He appears well-developed and well-nourished. No distress.  HENT:  Head: Normocephalic and atraumatic.  Right Ear: Tympanic membrane, external ear and ear canal normal. Tympanic membrane is not erythematous and not bulging.  Left Ear: Tympanic membrane, external ear and ear canal normal. Tympanic membrane is not erythematous and not bulging.  Nose: Nose normal. Right sinus exhibits no maxillary sinus tenderness and no frontal sinus tenderness. Left sinus exhibits no maxillary sinus tenderness and no frontal sinus tenderness.  Mouth/Throat: Uvula is midline, oropharynx is clear and moist and mucous membranes are normal. Oral lesions present. No trismus in the jaw. Abnormal dentition.    Floor of mouth soft to palpation. No obvious  ulcers seen. Slight erythema on frenulum.   Eyes: Pupils are equal, round, and reactive to light. Conjunctivae are normal.  Neck: Normal range of motion. Neck supple.  Cardiovascular: Normal rate, regular rhythm and normal heart sounds.  Exam reveals no gallop and no friction rub.   No murmur heard. Pulmonary/Chest: Effort normal and breath sounds normal. He has no decreased breath sounds. He has no wheezes. He has no rhonchi. He has no rales.  Lymphadenopathy:    He has no cervical adenopathy.  Neurological: He is alert and oriented to person, place,  and time.  Skin: Skin is warm and dry.  Psychiatric: He has a normal mood and affect. His behavior is normal. Judgment normal.    Urgent Care Course     Procedures (including critical care time)  Labs Review Labs Reviewed - No data to display  Imaging Review No results found.       MDM   1. Mouth pain    Discussed with patient exam showed possible dental abscess, given abnormal dentition seen, will cover with Augmentin. Given without obvious sores seen, lidocaine solution given for pain. Take ibuprofen for pain. Follow up with dentist for further evaluation. Follow up with PCP as needed for further evaluation if sores do not heal. Return precautions given.     Ok Edwards, PA-C 10/25/16 2211

## 2016-11-03 DIAGNOSIS — I10 Essential (primary) hypertension: Secondary | ICD-10-CM | POA: Diagnosis not present

## 2016-11-03 DIAGNOSIS — K029 Dental caries, unspecified: Secondary | ICD-10-CM | POA: Diagnosis not present

## 2016-11-03 DIAGNOSIS — K0381 Cracked tooth: Secondary | ICD-10-CM | POA: Diagnosis not present

## 2017-02-16 DIAGNOSIS — M545 Low back pain: Secondary | ICD-10-CM | POA: Diagnosis not present

## 2017-05-24 ENCOUNTER — Emergency Department (HOSPITAL_COMMUNITY)
Admission: EM | Admit: 2017-05-24 | Discharge: 2017-05-25 | Disposition: A | Discharge status: Home / Self Care | Payer: Self-pay | Attending: Emergency Medicine | Admitting: Emergency Medicine

## 2017-05-24 ENCOUNTER — Encounter (HOSPITAL_COMMUNITY): Payer: Self-pay | Admitting: Emergency Medicine

## 2017-05-24 ENCOUNTER — Other Ambulatory Visit: Payer: Self-pay

## 2017-05-24 DIAGNOSIS — R110 Nausea: Secondary | ICD-10-CM | POA: Insufficient documentation

## 2017-05-24 DIAGNOSIS — Z79899 Other long term (current) drug therapy: Secondary | ICD-10-CM | POA: Insufficient documentation

## 2017-05-24 DIAGNOSIS — R1030 Lower abdominal pain, unspecified: Secondary | ICD-10-CM | POA: Insufficient documentation

## 2017-05-24 DIAGNOSIS — R635 Abnormal weight gain: Secondary | ICD-10-CM | POA: Insufficient documentation

## 2017-05-24 DIAGNOSIS — Z87891 Personal history of nicotine dependence: Secondary | ICD-10-CM | POA: Insufficient documentation

## 2017-05-24 DIAGNOSIS — I10 Essential (primary) hypertension: Secondary | ICD-10-CM | POA: Insufficient documentation

## 2017-05-24 DIAGNOSIS — K439 Ventral hernia without obstruction or gangrene: Secondary | ICD-10-CM | POA: Insufficient documentation

## 2017-05-24 DIAGNOSIS — K5901 Slow transit constipation: Secondary | ICD-10-CM | POA: Insufficient documentation

## 2017-05-24 LAB — URINALYSIS, ROUTINE W REFLEX MICROSCOPIC
BACTERIA UA: NONE SEEN
BILIRUBIN URINE: NEGATIVE
Glucose, UA: NEGATIVE mg/dL
KETONES UR: NEGATIVE mg/dL
LEUKOCYTES UA: NEGATIVE
Nitrite: NEGATIVE
Protein, ur: 30 mg/dL — AB
Specific Gravity, Urine: 1.028 (ref 1.005–1.030)
pH: 5 (ref 5.0–8.0)

## 2017-05-24 LAB — LIPASE, BLOOD: LIPASE: 27 U/L (ref 11–51)

## 2017-05-24 LAB — COMPREHENSIVE METABOLIC PANEL
ALBUMIN: 3.8 g/dL (ref 3.5–5.0)
ALT: 32 U/L (ref 17–63)
AST: 29 U/L (ref 15–41)
Alkaline Phosphatase: 121 U/L (ref 38–126)
Anion gap: 11 (ref 5–15)
BUN: 15 mg/dL (ref 6–20)
CHLORIDE: 103 mmol/L (ref 101–111)
CO2: 25 mmol/L (ref 22–32)
Calcium: 9 mg/dL (ref 8.9–10.3)
Creatinine, Ser: 1.01 mg/dL (ref 0.61–1.24)
GFR calc Af Amer: >60 mL/min (ref >60)
Glucose, Bld: 149 mg/dL — ABNORMAL HIGH (ref 65–99)
POTASSIUM: 3.5 mmol/L (ref 3.5–5.1)
SODIUM: 139 mmol/L (ref 135–145)
Total Bilirubin: 0.6 mg/dL (ref 0.3–1.2)
Total Protein: 7.6 g/dL (ref 6.5–8.1)

## 2017-05-24 LAB — CBC
HEMATOCRIT: 39.1 % (ref 39.0–52.0)
Hemoglobin: 12.6 g/dL — ABNORMAL LOW (ref 13.0–17.0)
MCH: 27 pg (ref 26.0–34.0)
MCHC: 32.2 g/dL (ref 30.0–36.0)
MCV: 83.9 fL (ref 78.0–100.0)
Platelets: 200 10*3/uL (ref 150–400)
RBC: 4.66 MIL/uL (ref 4.22–5.81)
RDW: 12.7 % (ref 11.5–15.5)
WBC: 11.5 10*3/uL — AB (ref 4.0–10.5)

## 2017-05-24 MED ORDER — FENTANYL CITRATE (PF) 100 MCG/2ML IJ SOLN
100.0000 ug | Freq: Once | INTRAMUSCULAR | Status: AC
  Administered 2017-05-25: 100 ug via INTRAVENOUS
  Filled 2017-05-24: qty 2

## 2017-05-24 MED ORDER — ONDANSETRON HCL 4 MG/2ML IJ SOLN
4.0000 mg | Freq: Once | INTRAMUSCULAR | Status: AC
  Administered 2017-05-25: 4 mg via INTRAVENOUS
  Filled 2017-05-24: qty 2

## 2017-05-24 MED ORDER — IOPAMIDOL (ISOVUE-300) INJECTION 61%
INTRAVENOUS | Status: AC
  Administered 2017-05-25: 100 mL
  Filled 2017-05-24: qty 100

## 2017-05-24 NOTE — ED Provider Notes (Signed)
[facility] Provider Note   CSN: [id number] Arrival date & time: 05/24/17  2134     History   Chief Complaint Chief Complaint  Patient presents with  . Abdominal Pain    HPI Luis Lopez is a 42 y.o. male.  The history is provided by the patient.  Abdominal Pain   This is a new problem. The current episode started more than 2 days ago. The problem occurs daily. The problem has been gradually worsening. The pain is located in the RLQ and suprapubic region. The pain is moderate. Associated symptoms include nausea. Pertinent negatives include fever, vomiting and dysuria. The symptoms are aggravated by palpation. Nothing relieves the symptoms.  Patient presents with right lower quadrant pain for the past 2-3 days. He reports he feels a knot in his abdomen.  He reports nausea but no vomiting.  No change in his bowel habits. No chest pain reported No difficulty urinating.  Says he was seen by his primary doctor who felt that she come in to be evaluated.  Past Medical History:  Diagnosis Date  . Hypertension    borderline  . Sleep apnea   . Snoring     Patient Active Problem List   Diagnosis Date Noted  . Morbid obesity due to excess calories ([place]) 11/06/2015  . Nocturia more than twice per night 11/06/2015  . Sleep deprivation 11/06/2015  . Hypersomnia with sleep apnea 11/06/2015  . OSA (obstructive sleep apnea) 11/06/2015    History reviewed. No pertinent surgical history.     Home Medications    Prior to Admission medications   Medication Sig Start Date End Date Taking? Authorizing Provider  hydrochlorothiazide (HYDRODIURIL) 25 MG tablet Take 1 tablet (25 mg total) by mouth daily. 06/14/16  Yes West, Emily, PA-C  UNABLE TO FIND CPAP: At bedtime   Yes [provider]    Family History Family History  Problem Relation Age of Onset  . Diabetes Father   . Diabetes Brother     Social History Social History    Tobacco Use  . Smoking status: Former Smoker    Last attempt to quit: 03/22/2008    Years since quitting: 9.1  . Smokeless tobacco: Never Used  Substance Use Topics  . Alcohol use: No  . Drug use: No     Allergies   Patient has no known allergies.   Review of Systems Review of Systems  Constitutional: Positive for unexpected weight change. Negative for fever.       20 pound weight gain in 2 months  Cardiovascular: Negative for chest pain.  Gastrointestinal: Positive for abdominal pain and nausea. Negative for vomiting.  Genitourinary: Negative for dysuria.  All other systems reviewed and are negative.    Physical Exam Updated Vital Signs BP 132/85 (BP Location: Right Arm)   Pulse 68   Temp 98.3 F (36.8 C) (Oral)   Resp 16   Ht 1.905 m (6\' 3" )   Wt (!) 154.2 kg (340 lb)   SpO2 98%   BMI 42.50 kg/m   Physical Exam CONSTITUTIONAL: Well developed/well nourished HEAD: Normocephalic/atraumatic EYES: EOMI/PERRL ENMT: Mucous membranes moist NECK: supple no meningeal signs SPINE/BACK:entire spine nontender CV: S1/S2 noted, no murmurs/rubs/gallops noted LUNGS: Lungs are clear to auscultation bilaterally, no apparent distress ABDOMEN: soft, right lower quadrant tenderness noted, moderate, no rebound or guarding, bowel sounds noted throughout abdomen, no obvious hernia noted to abdominal wall GU:no cva tenderness NEURO: Pt is awake/alert/appropriate, moves all extremitiesx4.  No facial droop.   EXTREMITIES: pulses normal/equal, full ROM SKIN: warm, color normal PSYCH: no abnormalities of mood noted, alert and oriented to situation   ED Treatments / Results  Labs (all labs ordered are listed, but only abnormal results are displayed) Labs Reviewed  COMPREHENSIVE METABOLIC PANEL - Abnormal; Notable for the following components:      Result Value   Glucose, Bld 149 (*)    All other components within normal limits  CBC - Abnormal; Notable for the following  components:   WBC 11.5 (*)    Hemoglobin 12.6 (*)    All other components within normal limits  URINALYSIS, ROUTINE W REFLEX MICROSCOPIC - Abnormal; Notable for the following components:   Hgb urine dipstick MODERATE (*)    Protein, ur 30 (*)    Squamous Epithelial / LPF 0-5 (*)    All other components within normal limits  LIPASE, BLOOD    EKG  EKG Interpretation None       Radiology Ct Abdomen Pelvis W Contrast  Result Date: 05/25/2017 CLINICAL DATA:  Right lower quadrant pain x3 days. EXAM: CT ABDOMEN AND PELVIS WITH CONTRAST TECHNIQUE: Multidetector CT imaging of the abdomen and pelvis was performed using the standard protocol following bolus administration of intravenous contrast. CONTRAST:  155mL ISOVUE-300 IOPAMIDOL (ISOVUE-300) INJECTION 61% COMPARISON:  None. FINDINGS: Lower chest: Top-normal size heart. No pneumonic consolidation, effusion or pneumothorax. No dominant mass. Hepatobiliary: Mild hepatic steatosis. No enhancing mass lesions or biliary dilatation. Normal gallbladder. Pancreas: Normal Spleen: Normal Adrenals/Urinary Tract: Normal bilateral adrenal glands. Tiny too small to characterize cyst in the upper pole of the left kidney measuring approximately 5 mm. No obstructive uropathy. Physiologic distention of the urinary bladder without acute abnormality. Stomach/Bowel: Normal appendix. Moderate stool burden along the ascending through transverse colon. No small bowel dilatation or inflammation. Stomach is physiologically distended. Vascular/Lymphatic: No significant vascular findings are present. No enlarged abdominal or pelvic lymph nodes. Reproductive: Prostate is unremarkable. Other: Small fat containing periumbilical hernia. No free air nor free fluid. Musculoskeletal: No acute or significant osseous findings. IMPRESSION: 1. No acute bowel obstruction or inflammation. Normal appendix is visualized. 2. Moderate fecal retention within the ascending and transverse colon  query constipation. 3. Mild hepatic steatosis. 4. Too small to further characterize 5 mm cyst in the left kidney. Electronically Signed   By: Ashley Royalty M.D.   On: 05/25/2017 00:20    Procedures Procedures (including critical care time)  Medications Ordered in ED Medications  iopamidol (ISOVUE-300) 61 % injection (100 mLs  Contrast Given 05/25/17 0000)  fentaNYL (SUBLIMAZE) injection 100 mcg (100 mcg Intravenous Given 05/25/17 0014)  ondansetron (ZOFRAN) injection 4 mg (4 mg Intravenous Given 05/25/17 0014)     Initial Impression / Assessment and Plan / ED Course  I have reviewed the triage vital signs and the nursing notes.  Pertinent labs  results that were available during my care of the patient were reviewed by me and considered in my medical decision making (see chart for details).     11:43 PM We will proceed with CT abdomen pelvis.  He has focal abdominal tenderness. No obvious hernia noted 1:25 AM he is improved.  With IV pain medicine. CT imaging does not reveal any acute abdominal emergency. No signs of appendicitis.  He does have a small hernia, and constipation.  Repeat exam he has mild right lower quadrant tenderness, but otherwise his abdomen is soft and flat.  No vomiting. Will discharge home.  We discussed  strict ER return precautions Final Clinical Impressions(s) / ED Diagnoses   Final diagnoses:  Slow transit constipation  Hernia of abdominal wall    ED Discharge Orders        Ordered    docusate sodium (COLACE) 100 MG capsule  Every 12 hours     05/25/17 0114       Ripley Fraise, MD 05/25/17 0126

## 2017-05-24 NOTE — ED Triage Notes (Signed)
Pt to ED with c/o RLQ pain x's 3 days.  Pt denies nausea or vomiting but st's he does not have a appetite.  Denies any diarrhea

## 2017-05-25 ENCOUNTER — Other Ambulatory Visit (HOSPITAL_COMMUNITY): Payer: Self-pay

## 2017-05-25 ENCOUNTER — Encounter (HOSPITAL_COMMUNITY): Payer: Self-pay | Admitting: Radiology

## 2017-05-25 ENCOUNTER — Emergency Department (HOSPITAL_COMMUNITY): Payer: Self-pay

## 2017-05-25 IMAGING — CT CT ABD-PELV W/ CM
3 of 6 series · 17 of 46 positions shown, 19 images · IV contrast (APPLIED)
Comparison: None.

CLINICAL DATA: Right lower quadrant pain x3 days.

EXAM:
CT ABDOMEN AND PELVIS WITH CONTRAST
TECHNIQUE: Multidetector CT imaging of the abdomen and pelvis was performed
using the standard protocol following bolus administration of
intravenous contrast.
CONTRAST:  100mL [UH] IOPAMIDOL ([UH]) INJECTION 61%

[Series 3: abdomen 5.0 · axial · 0.88mm/px · z∈[+931,+1371]mm · 12 of 104 slices shown, 14 images]
[im 8/104  soft-tissue]
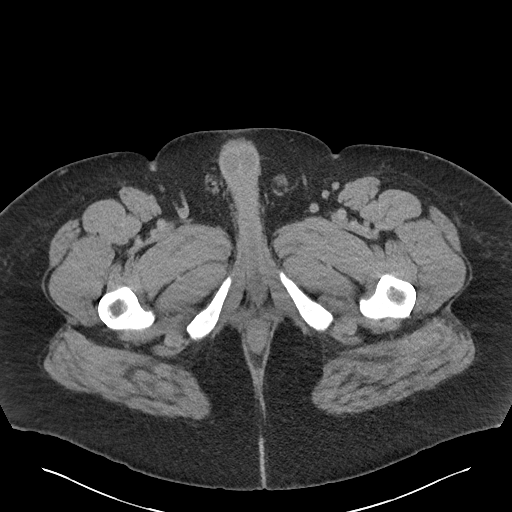
[im 8/104  bone]
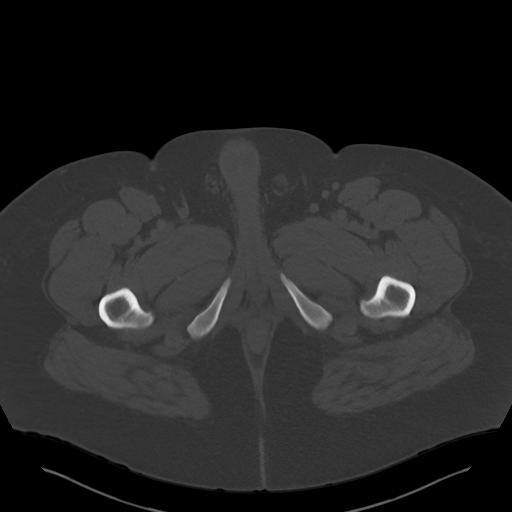
[im 16/104  soft-tissue]
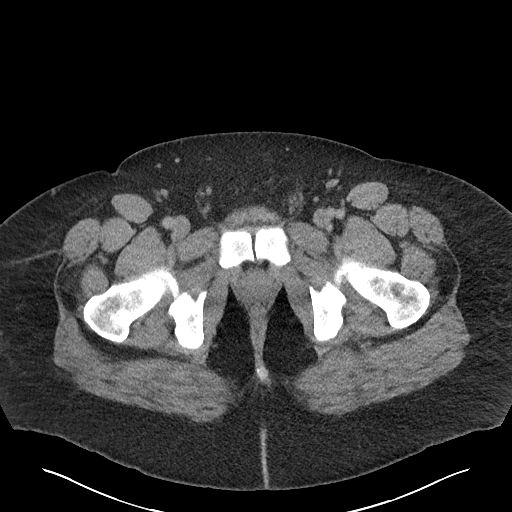
[im 24/104  soft-tissue]
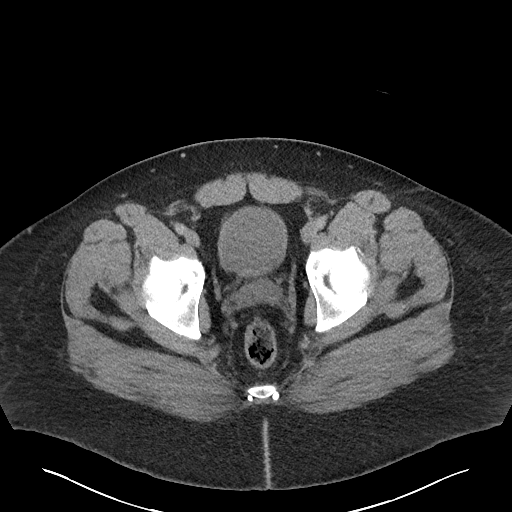
[im 32/104  soft-tissue]
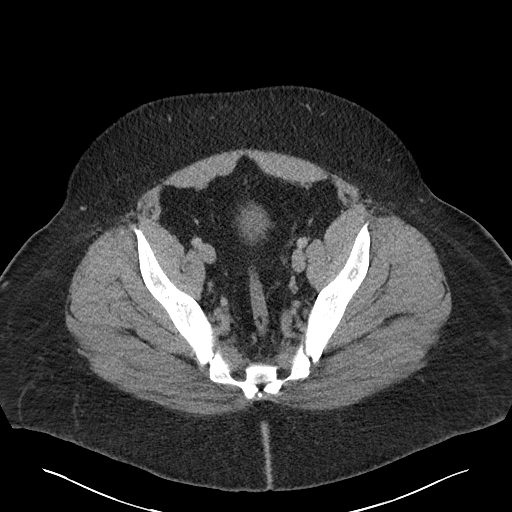
[im 40/104  soft-tissue]
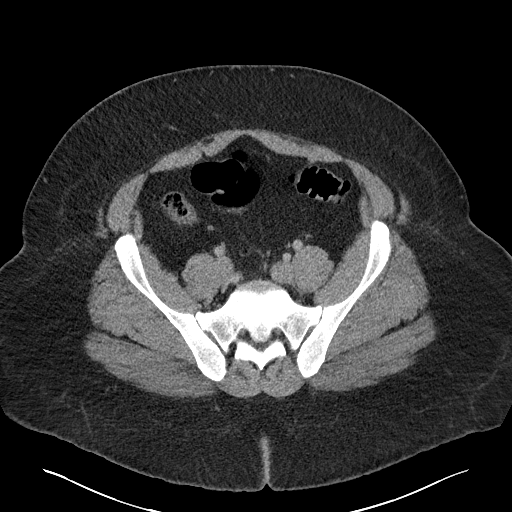
[im 48/104  soft-tissue]
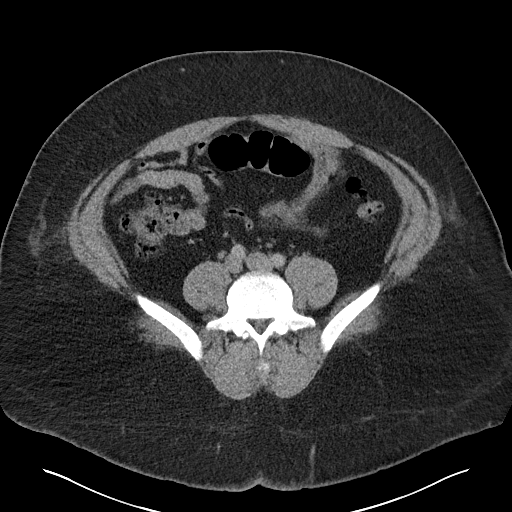
[im 56/104  soft-tissue]
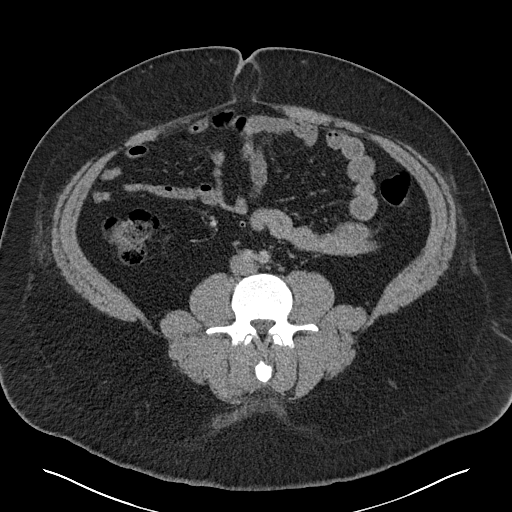
[im 64/104  soft-tissue]
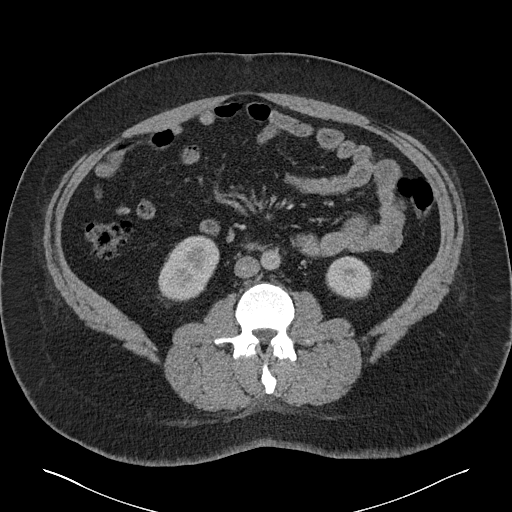
[im 72/104  soft-tissue]
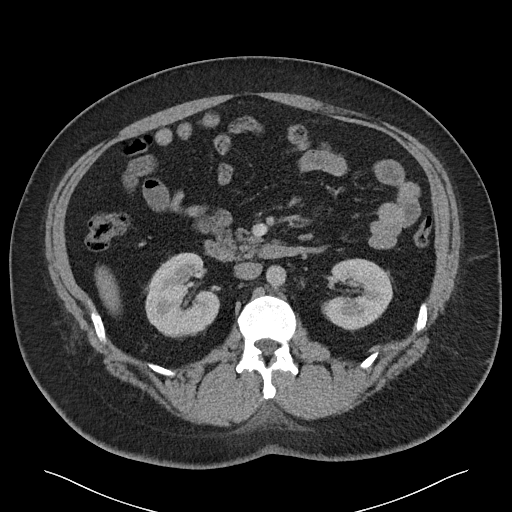
[im 72/104  bone]
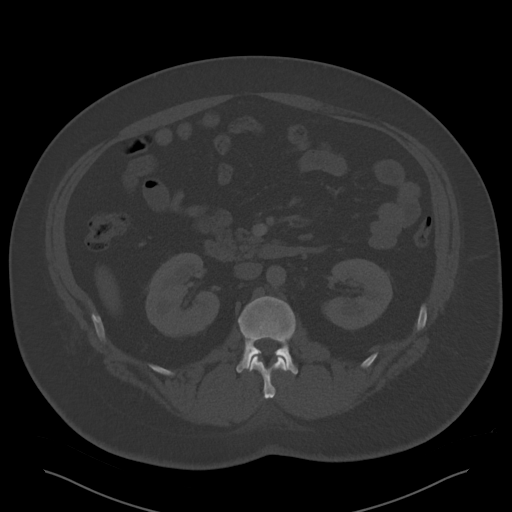
[im 80/104  soft-tissue]
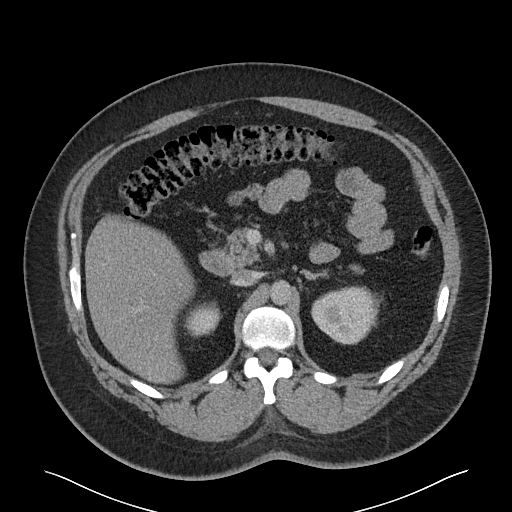
[im 88/104  soft-tissue]
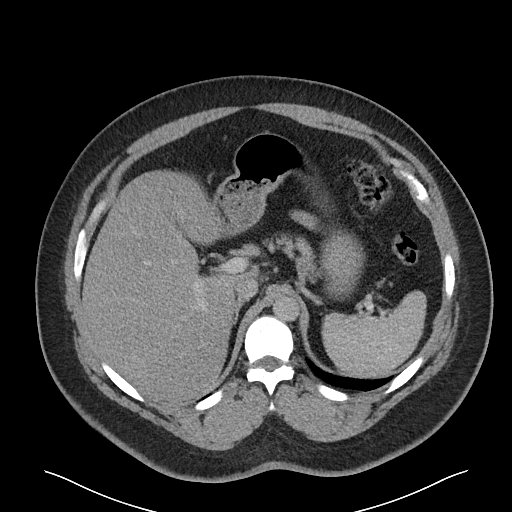
[im 96/104  soft-tissue]
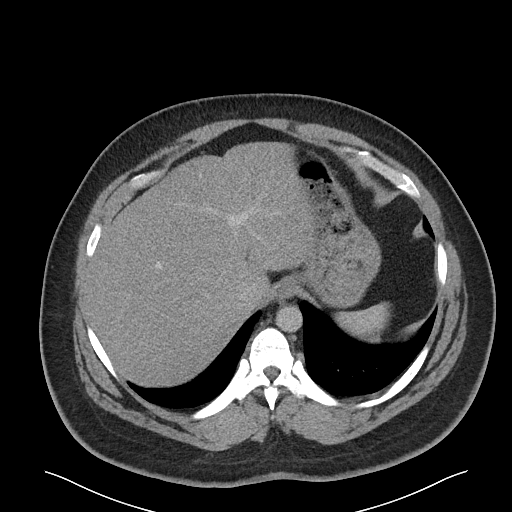

[Series 5: lung · axial · 0.88mm/px · z∈[+1277,+1291]mm · 2 of 75 slices shown]
[im 8/75  bone]
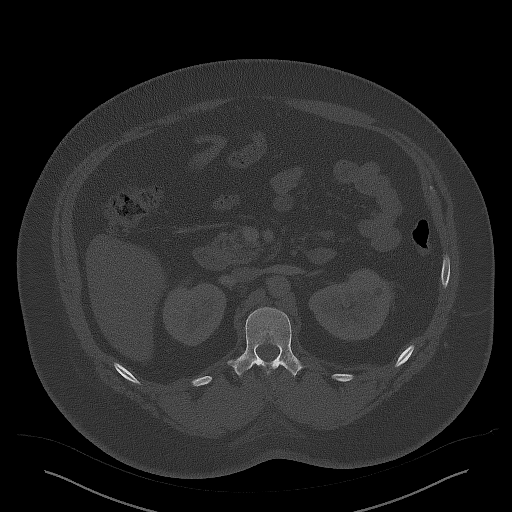
[im 15/75  bone]
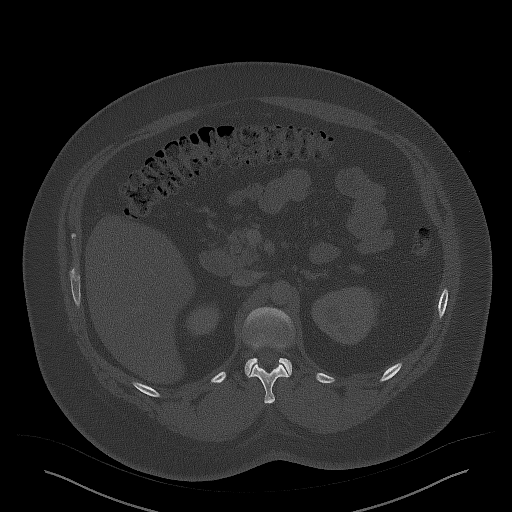

[Series 6: abdomen 3.0 mpr cor · coronal · 1.00mm/px · 3 of 128 slices shown]
[im 43/128  soft-tissue]
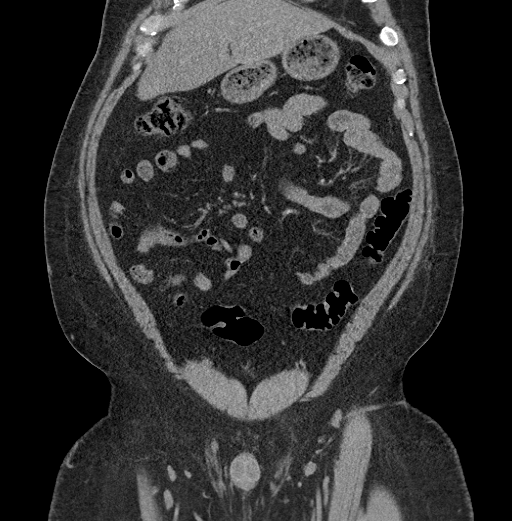
[im 57/128  soft-tissue]
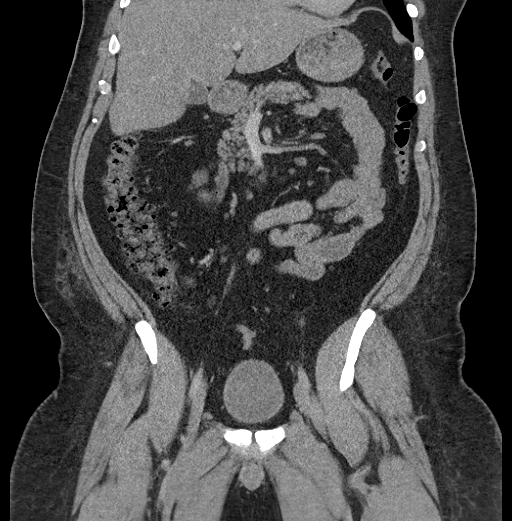
[im 71/128  soft-tissue]
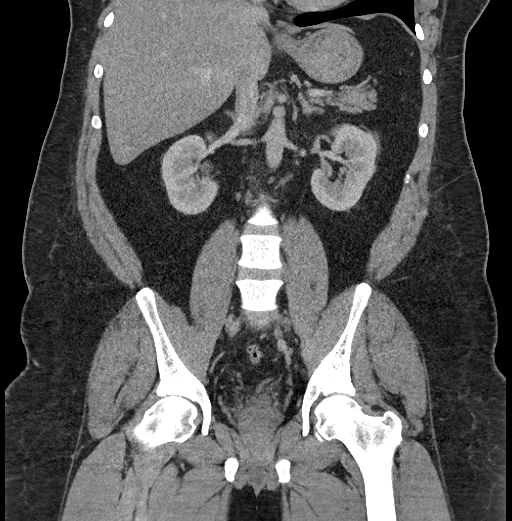

[17 of 46 positions shown; findings below may reference images not displayed]

FINDINGS: Lower chest: Top-normal size heart. No pneumonic consolidation,
effusion or pneumothorax. No dominant mass.

Hepatobiliary: Mild hepatic steatosis. No enhancing mass lesions or
biliary dilatation. Normal gallbladder.

Pancreas: Normal

Spleen: Normal

Adrenals/Urinary Tract: Normal bilateral adrenal glands. Tiny too
small to characterize cyst in the upper pole of the left kidney
measuring approximately 5 mm. No obstructive uropathy. Physiologic
distention of the urinary bladder without acute abnormality.

Stomach/Bowel: Normal appendix. Moderate stool burden along the
ascending through transverse colon. No small bowel dilatation or
inflammation. Stomach is physiologically distended.

Vascular/Lymphatic: No significant vascular findings are present. No
enlarged abdominal or pelvic lymph nodes.

Reproductive: Prostate is unremarkable.

Other: Small fat containing periumbilical hernia. No free air nor
free fluid.

Musculoskeletal: No acute or significant osseous findings.
IMPRESSION: 1. No acute bowel obstruction or inflammation. Normal appendix is
visualized.
2. Moderate fecal retention within the ascending and transverse
colon query constipation.
3. Mild hepatic steatosis.
4. Too small to further characterize 5 mm cyst in the left kidney.

## 2017-05-25 MED ORDER — DOCUSATE SODIUM 100 MG PO CAPS: 100.0000 mg | ORAL_CAPSULE | Freq: Two times a day (BID) | ORAL | 0 refills | Status: DC

## 2017-06-01 ENCOUNTER — Other Ambulatory Visit: Payer: Self-pay | Admitting: Nurse Practitioner

## 2017-06-01 DIAGNOSIS — R109 Unspecified abdominal pain: Secondary | ICD-10-CM

## 2017-09-21 DIAGNOSIS — R21 Rash and other nonspecific skin eruption: Secondary | ICD-10-CM | POA: Diagnosis not present

## 2017-09-21 DIAGNOSIS — M79672 Pain in left foot: Secondary | ICD-10-CM | POA: Diagnosis not present

## 2017-09-21 DIAGNOSIS — I10 Essential (primary) hypertension: Secondary | ICD-10-CM | POA: Diagnosis not present

## 2017-10-10 ENCOUNTER — Other Ambulatory Visit: Payer: Self-pay | Admitting: Podiatry

## 2017-10-10 ENCOUNTER — Ambulatory Visit (INDEPENDENT_AMBULATORY_CARE_PROVIDER_SITE_OTHER): Payer: BLUE CROSS/BLUE SHIELD

## 2017-10-10 ENCOUNTER — Ambulatory Visit: Payer: BLUE CROSS/BLUE SHIELD | Admitting: Podiatry

## 2017-10-10 ENCOUNTER — Encounter: Payer: Self-pay | Admitting: Podiatry

## 2017-10-10 VITALS — BP 141/91 | HR 82

## 2017-10-10 DIAGNOSIS — D492 Neoplasm of unspecified behavior of bone, soft tissue, and skin: Secondary | ICD-10-CM

## 2017-10-10 DIAGNOSIS — M79672 Pain in left foot: Secondary | ICD-10-CM

## 2017-10-10 MED ORDER — TRIAMCINOLONE ACETONIDE 10 MG/ML IJ SUSP
10.0000 mg | Freq: Once | INTRAMUSCULAR | Status: AC
  Administered 2017-10-10: 10 mg

## 2017-10-12 NOTE — Progress Notes (Signed)
Subjective:   Patient ID: Luis Lopez, male   DOB: 42 y.o.   MRN: [id number]   HPI Patient presents stating that has been starting to get some discomfort with a chronic lesion is had on the plantar aspect of his left foot and it feels like there is inflammation.  States that it is been there for years and years and is not changed in shape or size but is just started to become moderately painful around the area over the last couple months.  Patient does not smoke and likes to be active   Review of Systems  All other systems reviewed and are negative.       Objective:  Physical Exam  Constitutional: He appears well-developed and well-nourished.  Cardiovascular: Intact distal pulses.  Pulmonary/Chest: Effort normal.  Musculoskeletal: Normal range of motion.  Neurological: He is alert.  Skin: Skin is warm.  Nursing note and vitals reviewed.   Neurovascular status found to be intact muscle strength is found to be adequate range of motion within normal limits.  Patient is found to have flat-like lesion on the plantar aspect of the left lateral foot measuring about 2 cm x 2 cm that does not have any indication of proximal extension and it does not have any discoloration associated with it.  There is quite a bit of discomfort in the plantar fascia of the lateral tendon which may be a secondary finding     Assessment:  Chronic soft tissue lesion which appears to be benign given its long-term history and no change in color with inflammation present     Plan:  H&P condition reviewed and I recommended careful injection of the plantar fascia and we advised this would be difficult to remove due to the nature of this and as long as it does not change in color shape or size I think it can be watched and left alone.  I did offer him biopsy and he denies wanting this at this time and I did inject the plantar fascia 3 mg Kenalog 5 Milgram Xylocaine to reduce inflammation  X-ray was negative for signs  of calcification is associated with this with moderate flatfoot deformity and spur formation

## 2017-10-27 ENCOUNTER — Emergency Department (HOSPITAL_COMMUNITY): Payer: BLUE CROSS/BLUE SHIELD

## 2017-10-27 ENCOUNTER — Encounter (HOSPITAL_COMMUNITY): Payer: Self-pay

## 2017-10-27 ENCOUNTER — Emergency Department (HOSPITAL_COMMUNITY)
Admission: EM | Admit: 2017-10-27 | Discharge: 2017-10-27 | Disposition: A | Discharge status: Home / Self Care | Payer: BLUE CROSS/BLUE SHIELD | Attending: Emergency Medicine | Admitting: Emergency Medicine

## 2017-10-27 ENCOUNTER — Other Ambulatory Visit: Payer: Self-pay

## 2017-10-27 DIAGNOSIS — K429 Umbilical hernia without obstruction or gangrene: Secondary | ICD-10-CM | POA: Diagnosis not present

## 2017-10-27 DIAGNOSIS — R1031 Right lower quadrant pain: Secondary | ICD-10-CM | POA: Diagnosis not present

## 2017-10-27 DIAGNOSIS — R110 Nausea: Secondary | ICD-10-CM | POA: Insufficient documentation

## 2017-10-27 DIAGNOSIS — R1033 Periumbilical pain: Secondary | ICD-10-CM | POA: Diagnosis not present

## 2017-10-27 DIAGNOSIS — R197 Diarrhea, unspecified: Secondary | ICD-10-CM | POA: Insufficient documentation

## 2017-10-27 DIAGNOSIS — Z87891 Personal history of nicotine dependence: Secondary | ICD-10-CM | POA: Insufficient documentation

## 2017-10-27 DIAGNOSIS — R109 Unspecified abdominal pain: Secondary | ICD-10-CM | POA: Diagnosis not present

## 2017-10-27 DIAGNOSIS — Z79899 Other long term (current) drug therapy: Secondary | ICD-10-CM | POA: Diagnosis not present

## 2017-10-27 LAB — COMPREHENSIVE METABOLIC PANEL
ALT: 39 U/L (ref 0–44)
AST: 25 U/L (ref 15–41)
Albumin: 3.8 g/dL (ref 3.5–5.0)
Alkaline Phosphatase: 112 U/L (ref 38–126)
Anion gap: 9 (ref 5–15)
BUN: 16 mg/dL (ref 6–20)
CO2: 28 mmol/L (ref 22–32)
Calcium: 9 mg/dL (ref 8.9–10.3)
Chloride: 105 mmol/L (ref 98–111)
Creatinine, Ser: 0.92 mg/dL (ref 0.61–1.24)
GFR calc non Af Amer: >60 mL/min (ref >60)
Glucose, Bld: 118 mg/dL — ABNORMAL HIGH (ref 70–99)
POTASSIUM: 3.8 mmol/L (ref 3.5–5.1)
SODIUM: 142 mmol/L (ref 135–145)
Total Bilirubin: 0.7 mg/dL (ref 0.3–1.2)
Total Protein: 7.8 g/dL (ref 6.5–8.1)

## 2017-10-27 LAB — URINALYSIS, ROUTINE W REFLEX MICROSCOPIC
BACTERIA UA: NONE SEEN
BILIRUBIN URINE: NEGATIVE
Glucose, UA: >=500 mg/dL — AB
Hgb urine dipstick: NEGATIVE
KETONES UR: NEGATIVE mg/dL
LEUKOCYTES UA: NEGATIVE
Nitrite: NEGATIVE
PROTEIN: NEGATIVE mg/dL
Specific Gravity, Urine: 1.013 (ref 1.005–1.030)
pH: 6 (ref 5.0–8.0)

## 2017-10-27 LAB — CBC
HEMATOCRIT: 39.6 % (ref 39.0–52.0)
Hemoglobin: 12.8 g/dL — ABNORMAL LOW (ref 13.0–17.0)
MCH: 27.1 pg (ref 26.0–34.0)
MCHC: 32.3 g/dL (ref 30.0–36.0)
MCV: 83.9 fL (ref 78.0–100.0)
Platelets: 219 10*3/uL (ref 150–400)
RBC: 4.72 MIL/uL (ref 4.22–5.81)
RDW: 12.6 % (ref 11.5–15.5)
WBC: 9.2 10*3/uL (ref 4.0–10.5)

## 2017-10-27 LAB — LIPASE, BLOOD: LIPASE: 28 U/L (ref 11–51)

## 2017-10-27 IMAGING — CT CT ABD-PELV W/ CM
2 of 5 series · 16 of 46 positions shown, 18 images · IV contrast (ISOVUE)
Comparison: None.

CLINICAL DATA: Umbilical hernia pain.

EXAM:
CT ABDOMEN AND PELVIS WITH CONTRAST
TECHNIQUE: Multidetector CT imaging of the abdomen and pelvis was performed
using the standard protocol following bolus administration of
intravenous contrast.
CONTRAST:  100mL [SW] IOPAMIDOL ([SW]) INJECTION 61%

[Series 2: axial st · axial · 0.86mm/px · z∈[+1129,+1614]mm · 13 of 115 slices shown, 15 images]
[im 9/115  soft-tissue]
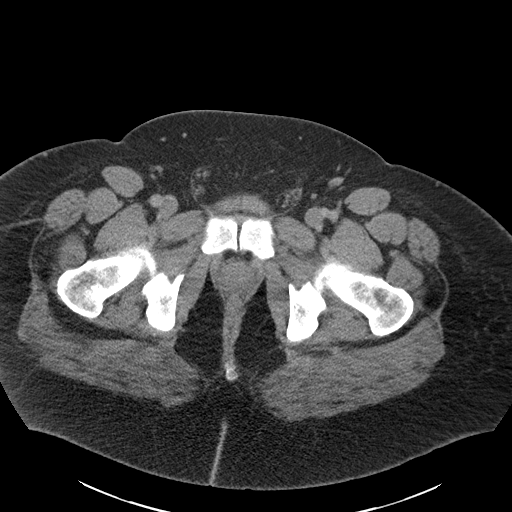
[im 9/115  bone]
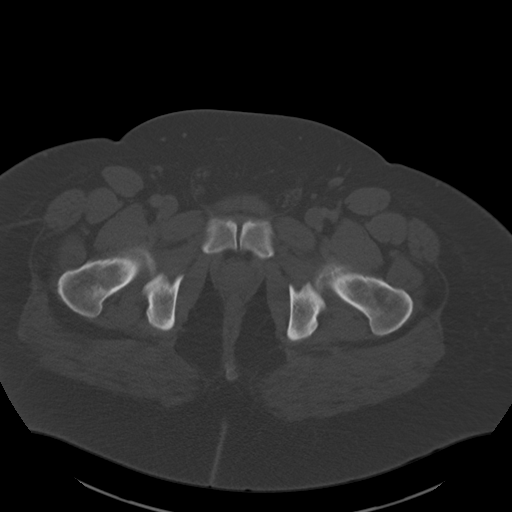
[im 17/115  soft-tissue]
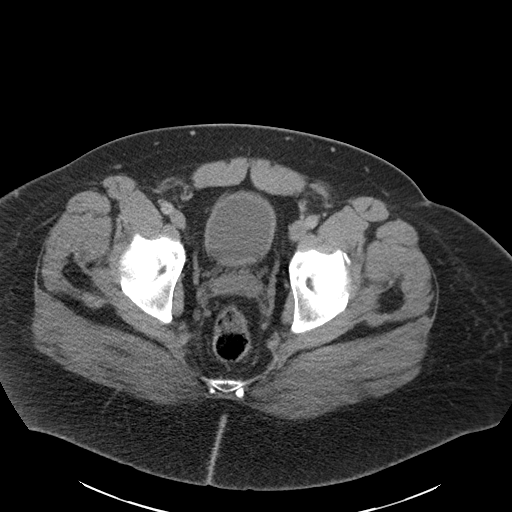
[im 25/115  soft-tissue]
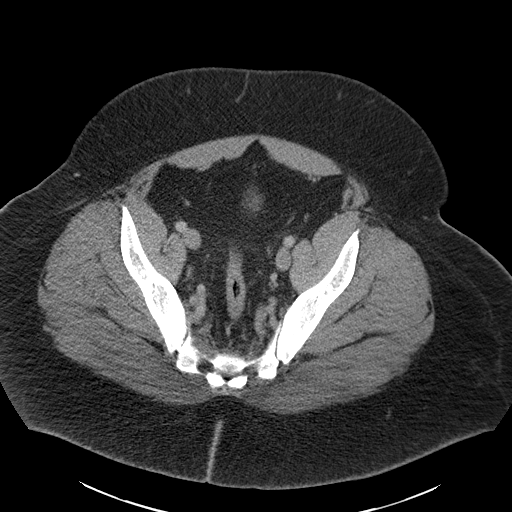
[im 33/115  soft-tissue]
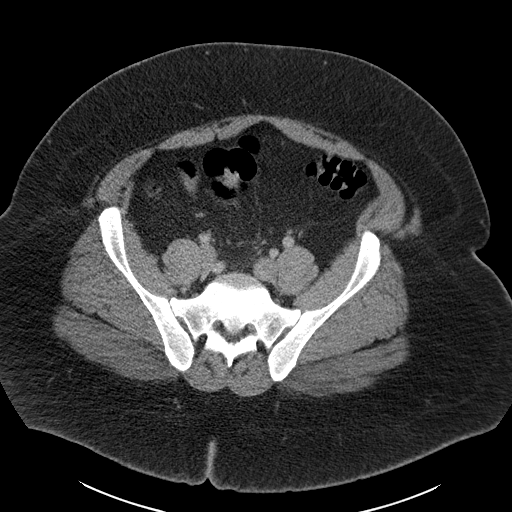
[im 41/115  soft-tissue]
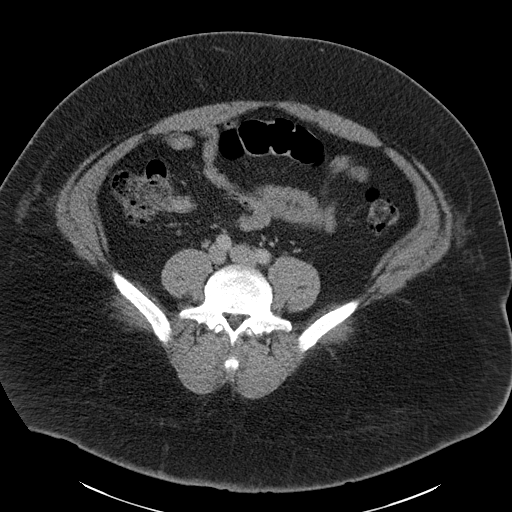
[im 49/115  soft-tissue]
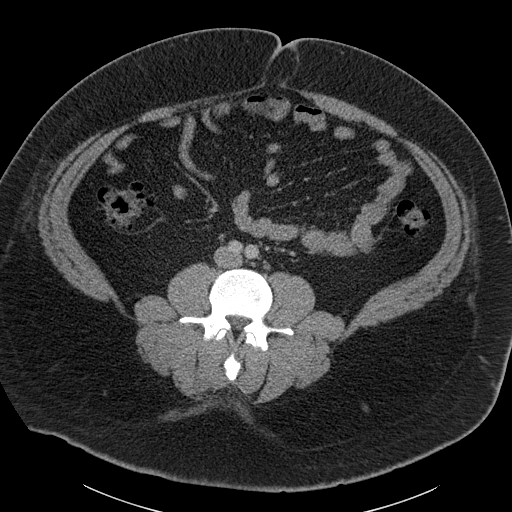
[im 58/115  soft-tissue]
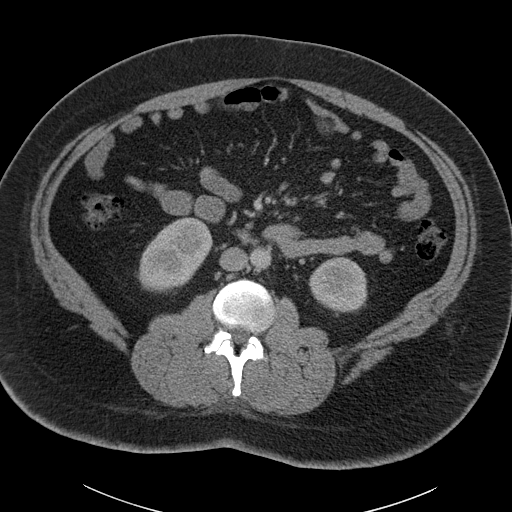
[im 66/115  soft-tissue]
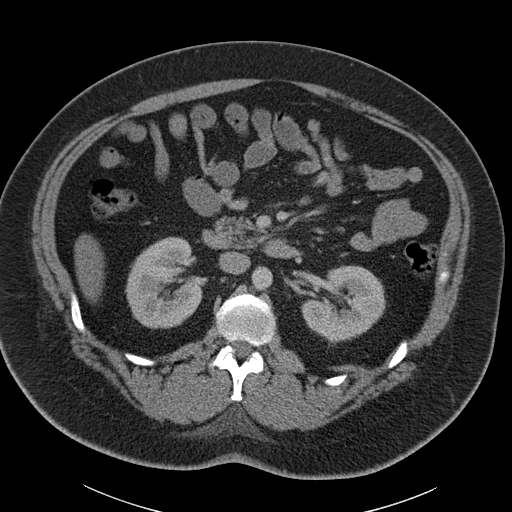
[im 74/115  soft-tissue]
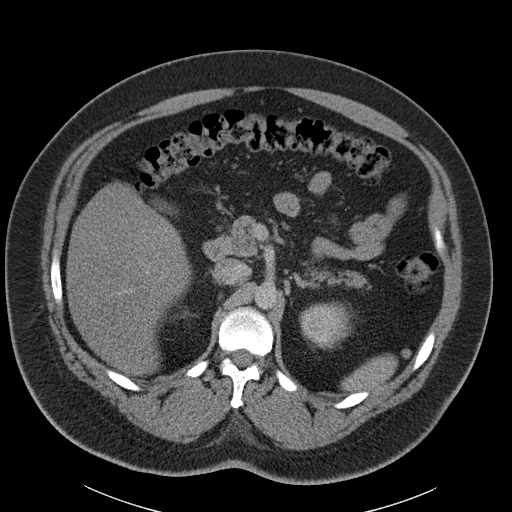
[im 74/115  bone]
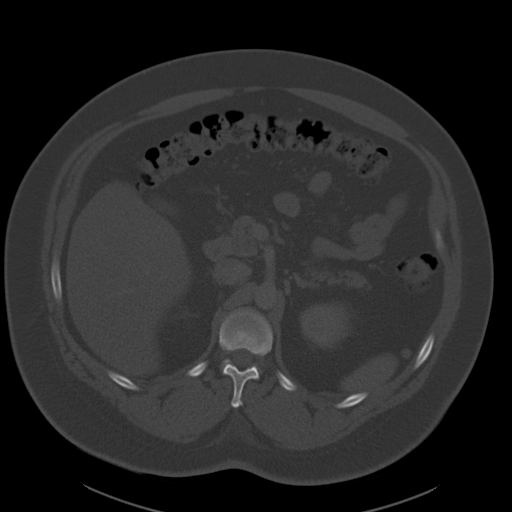
[im 82/115  soft-tissue]
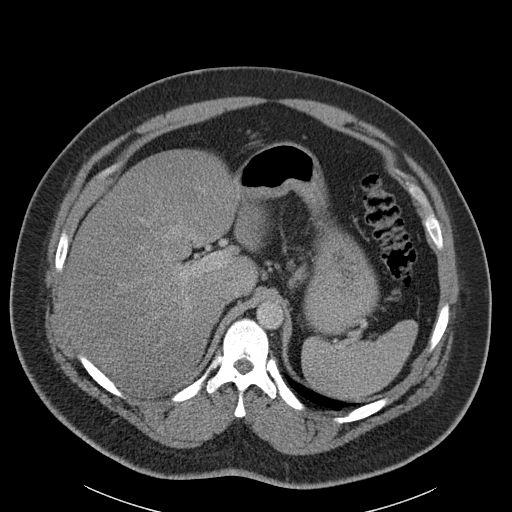
[im 90/115  soft-tissue]
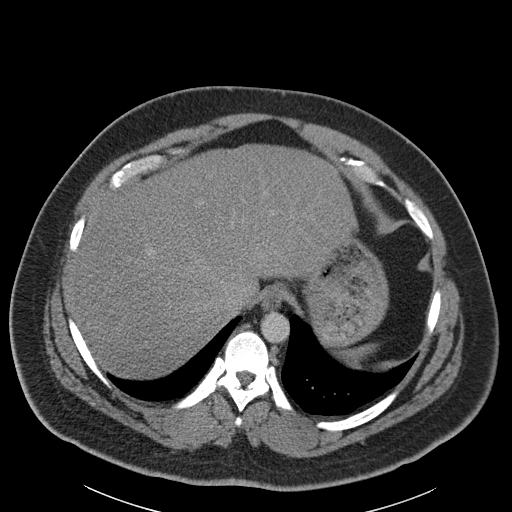
[im 98/115  soft-tissue]
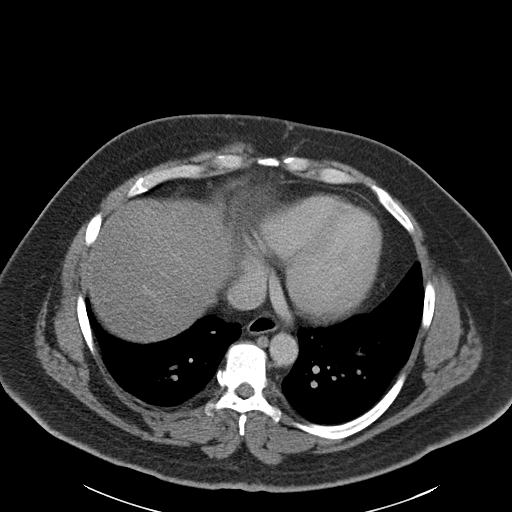
[im 106/115  soft-tissue]
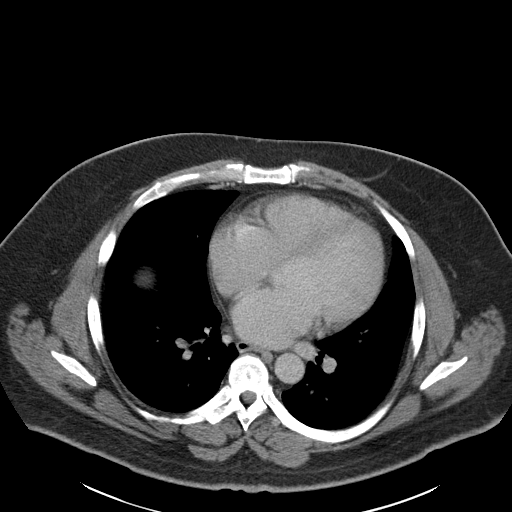

[Series 5: coronal st · coronal · 0.79mm/px · 3 of 111 slices shown]
[im 37/111  soft-tissue]
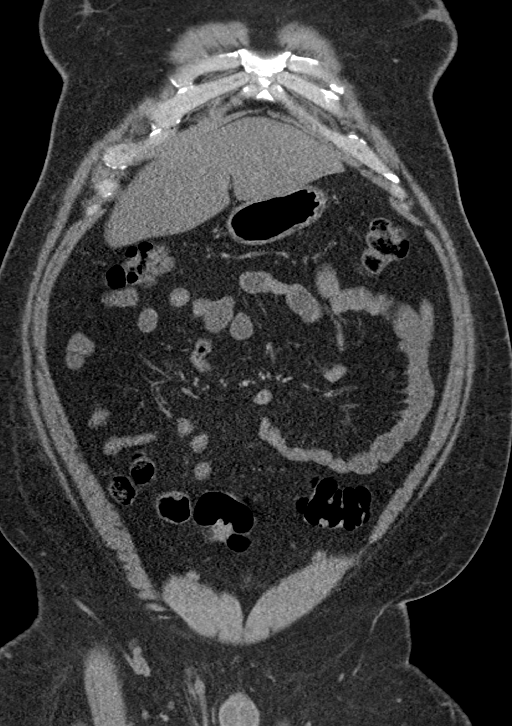
[im 49/111  soft-tissue]
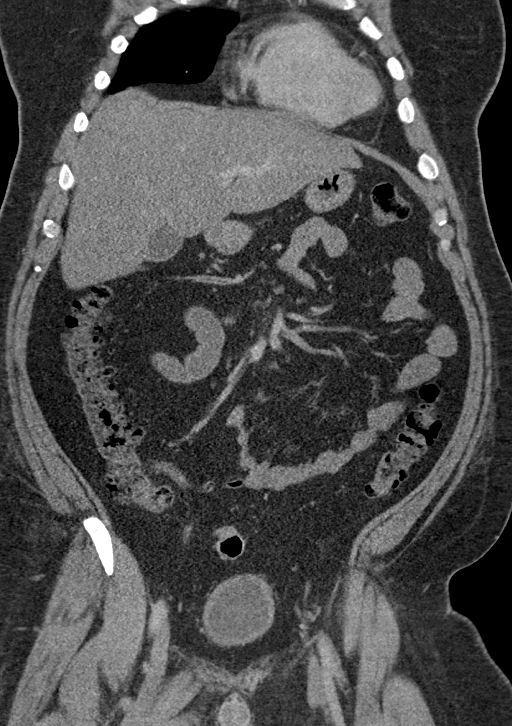
[im 62/111  soft-tissue]
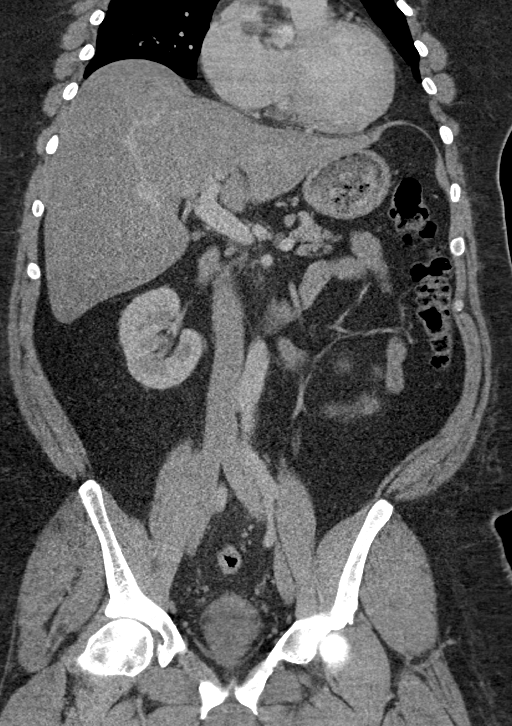

[16 of 46 positions shown; findings below may reference images not displayed]

FINDINGS: Lower chest: Normal heart size.  Dependent bibasilar atelectasis.

Hepatobiliary: Mild hepatic steatosis without mass. Normal
gallbladder.

Pancreas: Normal

Spleen: Normal

Adrenals/Urinary Tract: Normal bilateral adrenal glands. Stable tiny
cyst off the lateral aspect of the right kidney measuring 7 mm. No
obstructive uropathy or nephrolithiasis. The urinary bladder is
unremarkable for the degree of distention.

Stomach/Bowel: Stomach is within normal limits. Appendix appears
normal. No evidence of bowel wall thickening, distention, or
inflammatory changes.

Vascular/Lymphatic: No significant vascular findings are present. No
enlarged abdominal or pelvic lymph nodes.

Reproductive: Prostate and seminal vesicles are normal.

Other: No free air nor free fluid. Redemonstration of periumbilical
fat containing hernia measuring 3.2 x 2.4 x 3.3 cm with mild of
hernia measuring 1.5 cm transverse by 1 cm craniocaudad. No
induration of the herniated fat to suggest incarceration.

Musculoskeletal: No acute or significant osseous findings.
IMPRESSION: 1. Redemonstration of stable fat containing umbilical hernia with
the hernia sac measuring 3.2 x 2.4 x 3.3 cm. No CT evidence of
incarcerated fat. No induration of the herniated fat is seen nor
fluid identified within the sac.
2. Stable hepatic steatosis.
3. Stable left lower pole renal cyst measuring 7 mm.

## 2017-10-27 MED ORDER — IOPAMIDOL (ISOVUE-300) INJECTION 61%
INTRAVENOUS | Status: AC
  Filled 2017-10-27: qty 100

## 2017-10-27 MED ORDER — ONDANSETRON HCL 4 MG/2ML IJ SOLN
4.0000 mg | Freq: Once | INTRAMUSCULAR | Status: AC
  Administered 2017-10-27: 4 mg via INTRAVENOUS
  Filled 2017-10-27: qty 2

## 2017-10-27 MED ORDER — ONDANSETRON 4 MG PO TBDP: 4.0000 mg | ORAL_TABLET | Freq: Three times a day (TID) | ORAL | 0 refills | Status: DC | PRN

## 2017-10-27 MED ORDER — HYDROMORPHONE HCL 1 MG/ML IJ SOLN
1.0000 mg | Freq: Once | INTRAMUSCULAR | Status: AC
  Administered 2017-10-27: 1 mg via INTRAVENOUS
  Filled 2017-10-27: qty 1

## 2017-10-27 MED ORDER — NAPROXEN 375 MG PO TABS: 375.0000 mg | ORAL_TABLET | Freq: Two times a day (BID) | ORAL | 0 refills | Status: DC

## 2017-10-27 MED ORDER — IOPAMIDOL (ISOVUE-300) INJECTION 61%
100.0000 mL | Freq: Once | INTRAVENOUS | Status: AC | PRN
  Administered 2017-10-27: 100 mL via INTRAVENOUS

## 2017-10-27 MED ORDER — SODIUM CHLORIDE 0.9 % IV BOLUS
1000.0000 mL | Freq: Once | INTRAVENOUS | Status: AC
  Administered 2017-10-27: 1000 mL via INTRAVENOUS

## 2017-10-27 NOTE — Discharge Instructions (Addendum)
If your abdominal pain worsens, you develop vomiting, fever, or any other new/concerning symptoms and return to the ER for evaluation.

## 2017-10-27 NOTE — ED Provider Notes (Signed)
[facility] DEPT Provider Note   CSN: [id number] Arrival date & time: 10/27/17  1150     History   Chief Complaint Chief Complaint  Patient presents with  . Hernia    HPI Luis Lopez is a 42 y.o. male.     42 year old male presents with abdominal pain.  He is been having abdominal pain for about a week and it has been worsening.  It is mostly positional.  It is in a C-shaped along the right side and midline around his umbilicus.  He has a known umbilical hernia but he states that this pain is more severe than these had before.  No vomiting but has been nauseated and having decreased appetite along with diarrhea for the last 2 or 3 days.  No blood in the stool.  No fevers.  No urinary symptoms.  The patient states that he has not taken anything for the pain.  Is about a 7 out of 10.  He has noticed some bulge at his umbilicus that seems to come and go.  However he is also having a lot of pain to the right side of his umbilicus which he has not had before.  Past Medical History:  Diagnosis Date  . Hypertension    borderline  . Sleep apnea   . Snoring     Patient Active Problem List   Diagnosis Date Noted  . Morbid obesity due to excess calories ([place]) 11/06/2015  . Nocturia more than twice per night 11/06/2015  . Sleep deprivation 11/06/2015  . Hypersomnia with sleep apnea 11/06/2015  . OSA (obstructive sleep apnea) 11/06/2015    History reviewed. No pertinent surgical history.      Home Medications    Prior to Admission medications   Medication Sig Start Date End Date Taking? Authorizing Provider  hydrochlorothiazide (HYDRODIURIL) 25 MG tablet Take 1 tablet (25 mg total) by mouth daily. 06/14/16  Yes West, Emily, PA-C  ibuprofen (ADVIL,MOTRIN) 800 MG tablet Take 800 mg by mouth every 6 (six) hours as needed. for pain 10/14/17  Yes [provider]  docusate sodium (COLACE) 100 MG capsule Take 1 capsule (100 mg total) by mouth every  12 (twelve) hours. Patient not taking: Reported on 10/27/2017 05/25/17   Ripley Fraise, MD  naproxen (NAPROSYN) 375 MG tablet Take 1 tablet (375 mg total) by mouth 2 (two) times daily with a meal. 10/27/17   Sherwood Gambler, MD  ondansetron (ZOFRAN ODT) 4 MG disintegrating tablet Take 1 tablet (4 mg total) by mouth every 8 (eight) hours as needed for nausea or vomiting. 10/27/17   Sherwood Gambler, MD  UNABLE TO FIND CPAP: At bedtime    [provider]    Family History Family History  Problem Relation Age of Onset  . Diabetes Father   . Diabetes Brother     Social History Social History   Tobacco Use  . Smoking status: Former Smoker    Last attempt to quit: 03/22/2008    Years since quitting: 9.6  . Smokeless tobacco: Never Used  Substance Use Topics  . Alcohol use: No  . Drug use: No     Allergies   Patient has no known allergies.   Review of Systems Review of Systems  Constitutional: Positive for appetite change and fatigue. Negative for fever.  Gastrointestinal: Positive for abdominal pain, diarrhea and nausea. Negative for vomiting.  Genitourinary: Negative for dysuria and hematuria.  All other systems reviewed and are negative.    Physical  Exam Updated Vital Signs BP (!) 145/83 (BP Location: Right Arm)   Pulse 68   Temp 98.2 F (36.8 C) (Oral)   Resp 15   Ht 6\' 3"  (1.905 m)   Wt (!) 154.2 kg   SpO2 98%   BMI 42.50 kg/m   Physical Exam  Constitutional: He is oriented to person, place, and time. He appears well-developed and well-nourished. No distress.  Morbidly obese  HENT:  Head: Normocephalic and atraumatic.  Right Ear: External ear normal.  Left Ear: External ear normal.  Nose: Nose normal.  Eyes: Right eye exhibits no discharge. Left eye exhibits no discharge.  Neck: Neck supple.  Cardiovascular: Normal rate, regular rhythm and normal heart sounds.  Pulmonary/Chest: Effort normal and breath sounds normal.  Abdominal: Soft. There is  tenderness in the right lower quadrant and periumbilical area. A hernia is present. Hernia confirmed positive in the ventral area (small, no incarceration).    It feels like there is a mass/hernia to the right of his umbilicus. This is tender. Exam is limited due to the obesity  Musculoskeletal: He exhibits no edema.  Neurological: He is alert and oriented to person, place, and time.  Skin: Skin is warm and dry. He is not diaphoretic.  Nursing note and vitals reviewed.    ED Treatments / Results  Labs (all labs ordered are listed, but only abnormal results are displayed) Labs Reviewed  COMPREHENSIVE METABOLIC PANEL - Abnormal; Notable for the following components:      Result Value   Glucose, Bld 118 (*)    All other components within normal limits  CBC - Abnormal; Notable for the following components:   Hemoglobin 12.8 (*)    All other components within normal limits  URINALYSIS, ROUTINE W REFLEX MICROSCOPIC - Abnormal; Notable for the following components:   Glucose, UA >=500 (*)    All other components within normal limits  LIPASE, BLOOD    EKG None  Radiology Ct Abdomen Pelvis W Contrast  Result Date: 10/27/2017 CLINICAL DATA:  Umbilical hernia pain. EXAM: CT ABDOMEN AND PELVIS WITH CONTRAST TECHNIQUE: Multidetector CT imaging of the abdomen and pelvis was performed using the standard protocol following bolus administration of intravenous contrast. CONTRAST:  185mL ISOVUE-300 IOPAMIDOL (ISOVUE-300) INJECTION 61% COMPARISON:  None. FINDINGS: Lower chest: Normal heart size.  Dependent bibasilar atelectasis. Hepatobiliary: Mild hepatic steatosis without mass. Normal gallbladder. Pancreas: Normal Spleen: Normal Adrenals/Urinary Tract: Normal bilateral adrenal glands. Stable tiny cyst off the lateral aspect of the right kidney measuring 7 mm. No obstructive uropathy or nephrolithiasis. The urinary bladder is unremarkable for the degree of distention. Stomach/Bowel: Stomach is within  normal limits. Appendix appears normal. No evidence of bowel wall thickening, distention, or inflammatory changes. Vascular/Lymphatic: No significant vascular findings are present. No enlarged abdominal or pelvic lymph nodes. Reproductive: Prostate and seminal vesicles are normal. Other: No free air nor free fluid. Redemonstration of periumbilical fat containing hernia measuring 3.2 x 2.4 x 3.3 cm with mild of hernia measuring 1.5 cm transverse by 1 cm craniocaudad. No induration of the herniated fat to suggest incarceration. Musculoskeletal: No acute or significant osseous findings. IMPRESSION: 1. Redemonstration of stable fat containing umbilical hernia with the hernia sac measuring 3.2 x 2.4 x 3.3 cm. No CT evidence of incarcerated fat. No induration of the herniated fat is seen nor fluid identified within the sac. 2. Stable hepatic steatosis. 3. Stable left lower pole renal cyst measuring 7 mm. Electronically Signed   By: Meredith Leeds.D.  On: 10/27/2017 18:02    Procedures Procedures (including critical care time)  Medications Ordered in ED Medications  sodium chloride 0.9 % bolus 1,000 mL (0 mLs Intravenous Stopped 10/27/17 1807)  HYDROmorphone (DILAUDID) injection 1 mg (1 mg Intravenous Given 10/27/17 1707)  ondansetron (ZOFRAN) injection 4 mg (4 mg Intravenous Given 10/27/17 1707)  iopamidol (ISOVUE-300) 61 % injection 100 mL (100 mLs Intravenous Contrast Given 10/27/17 1732)     Initial Impression / Assessment and Plan / ED Course  I have reviewed the triage vital signs and the nursing notes.  Pertinent labs & imaging results that were available during my care of the patient were reviewed by me and considered in my medical decision making (see chart for details).     It is unclear why the patient is having such right sided discomfort and tenderness as there is no obvious abnormality such as appendicitis or an abdominal wall hernia besides the umbilical hernia.  Otherwise this does not appear  incarcerated.  Thus he will be treated symptomatically for the pain and referred to general surgery for outpatient follow-up.  Abdominal pain could just be related to any GI illness given his diarrhea and nausea.  Discussed return precautions.  Final Clinical Impressions(s) / ED Diagnoses   Final diagnoses:  Umbilical hernia without obstruction and without gangrene  Right sided abdominal pain    ED Discharge Orders         Ordered    naproxen (NAPROSYN) 375 MG tablet  2 times daily with meals     10/27/17 1833    ondansetron (ZOFRAN ODT) 4 MG disintegrating tablet  Every 8 hours PRN     10/27/17 1833           Sherwood Gambler, MD 10/27/17 2337

## 2017-10-27 NOTE — ED Triage Notes (Signed)
Pt states he has a hernia near his naval area which has been hurting him. Pt states that he has been nauseated, and broke out in sweat.

## 2017-10-31 ENCOUNTER — Ambulatory Visit: Payer: Self-pay | Admitting: Surgery

## 2017-10-31 DIAGNOSIS — K429 Umbilical hernia without obstruction or gangrene: Secondary | ICD-10-CM | POA: Diagnosis not present

## 2017-10-31 NOTE — H&P (View-Only) (Signed)
oel Carlota Raspberry Documented: 10/31/2017 9:48 AM Location: [facility] Patient #: [id number] DOB: April 06, 1975 Married / Language: English / Race: Black or African American Male  History of Present Illness Marcello Moores A. Claudetta Sallie MD; 10/31/2017 10:18 AM) Patient words: Pt self refered from ED for umbilical hernia. It has been present for 1 year. It is getting larger. Pain is a 5 / 10 and made wose with lifting pushing or coughing. CT done in ED which shows umbilical hernia 1 .5 cm defect. No nausea or vomiting pain is burning sorness without radiation.  The patient is a 42 year old male.   Past Surgical History Sabino Gasser; 10/31/2017 9:54 AM) No pertinent past surgical history  Allergies Sabino Gasser; 10/31/2017 9:54 AM) No Known Drug Allergies [10/31/2017]: Allergies Reconciled  Medication History Sabino Gasser; 10/31/2017 9:55 AM) HydroCHLOROthiazide (50MG  Tablet, Oral) Active. Medications Reconciled  Social History Sabino Gasser; 10/31/2017 9:54 AM) Alcohol use Remotely quit alcohol use. Caffeine use Coffee. No drug use Tobacco use Former smoker.  Family History Sabino Gasser; 10/31/2017 9:54 AM) Alcohol Abuse Father. Diabetes Mellitus Father.  Other Problems Sabino Gasser; 10/31/2017 9:54 AM) Other disease, cancer, significant illness     Review of Systems (Kalisi Bevill A. Celica Kotowski MD; 10/31/2017 10:18 AM) General Present- Appetite Loss. Not Present- Chills, Fatigue, Fever, Night Sweats, Weight Gain and Weight Loss. Skin Not Present- Change in Wart/Mole, Dryness, Hives, Jaundice, New Lesions, Non-Healing Wounds, Rash and Ulcer. Respiratory Not Present- Bloody sputum, Chronic Cough, Difficulty Breathing, Snoring and Wheezing. Breast Not Present- Breast Mass, Breast Pain, Nipple Discharge and Skin Changes. Cardiovascular Not Present- Chest Pain, Difficulty Breathing Lying Down, Leg Cramps, Palpitations, Rapid Heart Rate, Shortness of Breath and Swelling of  Extremities. Gastrointestinal Not Present- Abdominal Pain, Bloating, Bloody Stool, Change in Bowel Habits, Chronic diarrhea, Constipation, Difficulty Swallowing, Excessive gas, Gets full quickly at meals, Hemorrhoids, Indigestion, Nausea, Rectal Pain and Vomiting. Male Genitourinary Not Present- Blood in Urine, Change in Urinary Stream, Frequency, Impotence, Nocturia, Painful Urination, Urgency and Urine Leakage. All other systems negative  Vitals Sabino Gasser; 10/31/2017 9:56 AM) 10/31/2017 9:55 AM Weight: 351.25 lb Height: 75in Body Surface Area: 2.79 m Body Mass Index: 43.9 kg/m  Temp.: 98.27F(Oral)  Pulse: 91 (Regular)  BP: 130/82 (Sitting, Left Arm, Standard)      Physical Exam (Mana Morison A. Taryn Nave MD; 10/31/2017 10:18 AM)  General Mental Status-Alert. General Appearance-Consistent with stated age. Hydration-Well hydrated. Voice-Normal.  Head and Neck Head-normocephalic, atraumatic with no lesions or palpable masses. Trachea-midline. Thyroid Gland Characteristics - normal size and consistency.  Eye Eyeball - Bilateral-Extraocular movements intact. Sclera/Conjunctiva - Bilateral-No scleral icterus.  Chest and Lung Exam Chest and lung exam reveals -quiet, even and easy respiratory effort with no use of accessory muscles and on auscultation, normal breath sounds, no adventitious sounds and normal vocal resonance. Inspection Chest Wall - Normal. Back - normal.  Cardiovascular Cardiovascular examination reveals -normal heart sounds, regular rate and rhythm with no murmurs and normal pedal pulses bilaterally.  Abdomen Note: reducible umbilical hernia moderate size  Neurologic Neurologic evaluation reveals -alert and oriented x 3 with no impairment of recent or remote memory. Mental Status-Normal.  Musculoskeletal Normal Exam - Left-Upper Extremity Strength Normal and Lower Extremity Strength Normal. Normal Exam - Right-Upper  Extremity Strength Normal and Lower Extremity Strength Normal.    Assessment & Plan (Renise Gillies A. Mychele Seyller MD; 0/45/4098 11:91 AM)  UMBILICAL HERNIA WITHOUT OBSTRUCTION AND WITHOUT GANGRENE (K42.9) Impression: recommend repair of umbilical hernia with mesh The risk of hernia repair include bleeding,  infection, organ injury, bowel injury, bladder injury, nerve injury recurrent hernia, blood clots, worsening of underlying condition, chronic pain, mesh use, open surgery, death, and the need for other operattions. Pt agrees to proceed  Current Plans You are being scheduled for surgery- Our schedulers will call you.  You should hear from our office's scheduling department within 5 working days about the location, date, and time of surgery. We try to make accommodations for patient's preferences in scheduling surgery, but sometimes the OR schedule or the surgeon's schedule prevents [place] from making those accommodations.  If you have not heard from our office [phone]) in 5 working days, call the office and ask for your surgeon's nurse.  If you have other questions about your diagnosis, plan, or surgery, call the office and ask for your surgeon's nurse.  Pt Education - Pamphlet Given - Hernia Surgery: discussed with patient and provided information. The anatomy & physiology of the abdominal wall was discussed. The pathophysiology of hernias was discussed. Natural history risks without surgery including progeressive enlargement, pain, incarceration, & strangulation was discussed. Contributors to complications such as smoking, obesity, diabetes, prior surgery, etc were discussed.  I feel the risks of no intervention will lead to serious problems that outweigh the operative risks; therefore, I recommended surgery to reduce and repair the hernia. I explained techniques with need for an open approach. I noted the probable use of mesh to patch and/or buttress the hernia repair  Risks such as  bleeding, infection, abscess, need for further treatment, heart attack, death, and other risks were discussed. I noted a good likelihood this will help address the problem. Goals of post-operative recovery were discussed as well. Possibility that this will not correct all symptoms was explained. I stressed the importance of low-impact activity, aggressive pain control, avoiding constipation, & not pushing through pain to minimize risk of post-operative chronic pain or injury. Possibility of reherniation especially with smoking, obesity, diabetes, immunosuppression, and other health conditions was discussed. We will work to minimize complications.  An educational handout further explaining the pathology & treatment options was given as well. Questions were answered. The patient expresses understanding & wishes to proceed with surgery.  Pt Education - CCS Mesh education: discussed with patient and

## 2017-10-31 NOTE — H&P (Signed)
oel Carlota Raspberry Documented: 10/31/2017 9:48 AM Location: [facility] Patient #: [id number] DOB: 1975/11/19 Married / Language: English / Race: Black or African American Male  History of Present Illness Marcello Moores A. Ronnesha Mester MD; 10/31/2017 10:18 AM) Patient words: Pt self refered from ED for umbilical hernia. It has been present for 1 year. It is getting larger. Pain is a 5 / 10 and made wose with lifting pushing or coughing. CT done in ED which shows umbilical hernia 1 .5 cm defect. No nausea or vomiting pain is burning sorness without radiation.  The patient is a 42 year old male.   Past Surgical History Sabino Gasser; 10/31/2017 9:54 AM) No pertinent past surgical history  Allergies Sabino Gasser; 10/31/2017 9:54 AM) No Known Drug Allergies [10/31/2017]: Allergies Reconciled  Medication History Sabino Gasser; 10/31/2017 9:55 AM) HydroCHLOROthiazide (50MG  Tablet, Oral) Active. Medications Reconciled  Social History Sabino Gasser; 10/31/2017 9:54 AM) Alcohol use Remotely quit alcohol use. Caffeine use Coffee. No drug use Tobacco use Former smoker.  Family History Sabino Gasser; 10/31/2017 9:54 AM) Alcohol Abuse Father. Diabetes Mellitus Father.  Other Problems Sabino Gasser; 10/31/2017 9:54 AM) Other disease, cancer, significant illness     Review of Systems (Mikisha Roseland A. Rhys Anchondo MD; 10/31/2017 10:18 AM) General Present- Appetite Loss. Not Present- Chills, Fatigue, Fever, Night Sweats, Weight Gain and Weight Loss. Skin Not Present- Change in Wart/Mole, Dryness, Hives, Jaundice, New Lesions, Non-Healing Wounds, Rash and Ulcer. Respiratory Not Present- Bloody sputum, Chronic Cough, Difficulty Breathing, Snoring and Wheezing. Breast Not Present- Breast Mass, Breast Pain, Nipple Discharge and Skin Changes. Cardiovascular Not Present- Chest Pain, Difficulty Breathing Lying Down, Leg Cramps, Palpitations, Rapid Heart Rate, Shortness of Breath and Swelling of  Extremities. Gastrointestinal Not Present- Abdominal Pain, Bloating, Bloody Stool, Change in Bowel Habits, Chronic diarrhea, Constipation, Difficulty Swallowing, Excessive gas, Gets full quickly at meals, Hemorrhoids, Indigestion, Nausea, Rectal Pain and Vomiting. Male Genitourinary Not Present- Blood in Urine, Change in Urinary Stream, Frequency, Impotence, Nocturia, Painful Urination, Urgency and Urine Leakage. All other systems negative  Vitals Sabino Gasser; 10/31/2017 9:56 AM) 10/31/2017 9:55 AM Weight: 351.25 lb Height: 75in Body Surface Area: 2.79 m Body Mass Index: 43.9 kg/m  Temp.: 98.39F(Oral)  Pulse: 91 (Regular)  BP: 130/82 (Sitting, Left Arm, Standard)      Physical Exam (Anuar Walgren A. Nissi Doffing MD; 10/31/2017 10:18 AM)  General Mental Status-Alert. General Appearance-Consistent with stated age. Hydration-Well hydrated. Voice-Normal.  Head and Neck Head-normocephalic, atraumatic with no lesions or palpable masses. Trachea-midline. Thyroid Gland Characteristics - normal size and consistency.  Eye Eyeball - Bilateral-Extraocular movements intact. Sclera/Conjunctiva - Bilateral-No scleral icterus.  Chest and Lung Exam Chest and lung exam reveals -quiet, even and easy respiratory effort with no use of accessory muscles and on auscultation, normal breath sounds, no adventitious sounds and normal vocal resonance. Inspection Chest Wall - Normal. Back - normal.  Cardiovascular Cardiovascular examination reveals -normal heart sounds, regular rate and rhythm with no murmurs and normal pedal pulses bilaterally.  Abdomen Note: reducible umbilical hernia moderate size  Neurologic Neurologic evaluation reveals -alert and oriented x 3 with no impairment of recent or remote memory. Mental Status-Normal.  Musculoskeletal Normal Exam - Left-Upper Extremity Strength Normal and Lower Extremity Strength Normal. Normal Exam - Right-Upper  Extremity Strength Normal and Lower Extremity Strength Normal.    Assessment & Plan (Kahliya Fraleigh A. Ayanni Tun MD; 9/70/2637 85:88 AM)  UMBILICAL HERNIA WITHOUT OBSTRUCTION AND WITHOUT GANGRENE (K42.9) Impression: recommend repair of umbilical hernia with mesh The risk of hernia repair include bleeding,  infection, organ injury, bowel injury, bladder injury, nerve injury recurrent hernia, blood clots, worsening of underlying condition, chronic pain, mesh use, open surgery, death, and the need for other operattions. Pt agrees to proceed  Current Plans You are being scheduled for surgery- Our schedulers will call you.  You should hear from our office's scheduling department within 5 working days about the location, date, and time of surgery. We try to make accommodations for patient's preferences in scheduling surgery, but sometimes the OR schedule or the surgeon's schedule prevents [place] from making those accommodations.  If you have not heard from our office [phone]) in 5 working days, call the office and ask for your surgeon's nurse.  If you have other questions about your diagnosis, plan, or surgery, call the office and ask for your surgeon's nurse.  Pt Education - Pamphlet Given - Hernia Surgery: discussed with patient and provided information. The anatomy & physiology of the abdominal wall was discussed. The pathophysiology of hernias was discussed. Natural history risks without surgery including progeressive enlargement, pain, incarceration, & strangulation was discussed. Contributors to complications such as smoking, obesity, diabetes, prior surgery, etc were discussed.  I feel the risks of no intervention will lead to serious problems that outweigh the operative risks; therefore, I recommended surgery to reduce and repair the hernia. I explained techniques with need for an open approach. I noted the probable use of mesh to patch and/or buttress the hernia repair  Risks such as  bleeding, infection, abscess, need for further treatment, heart attack, death, and other risks were discussed. I noted a good likelihood this will help address the problem. Goals of post-operative recovery were discussed as well. Possibility that this will not correct all symptoms was explained. I stressed the importance of low-impact activity, aggressive pain control, avoiding constipation, & not pushing through pain to minimize risk of post-operative chronic pain or injury. Possibility of reherniation especially with smoking, obesity, diabetes, immunosuppression, and other health conditions was discussed. We will work to minimize complications.  An educational handout further explaining the pathology & treatment options was given as well. Questions were answered. The patient expresses understanding & wishes to proceed with surgery.  Pt Education - CCS Mesh education: discussed with patient and

## 2017-11-02 ENCOUNTER — Encounter (HOSPITAL_COMMUNITY): Payer: Self-pay | Admitting: *Deleted

## 2017-11-02 ENCOUNTER — Other Ambulatory Visit: Payer: Self-pay

## 2017-11-02 MED ORDER — DEXTROSE 5 % IV SOLN
3.0000 g | INTRAVENOUS | Status: AC
  Administered 2017-11-03: 3 g via INTRAVENOUS
  Filled 2017-11-02: qty 3

## 2017-11-02 NOTE — Progress Notes (Signed)
Spoke with pt for pre-op call. Pt has hx of HTN, but denies cardiac history, chest pain or sob. When pt was in the ED earlier this month, he had an UA done and it showed >500 Glucose. Pt states he is not diabetic and no one mentioned this result to him at the time. He does have diabetes in his family.

## 2017-11-03 ENCOUNTER — Other Ambulatory Visit: Payer: Self-pay

## 2017-11-03 ENCOUNTER — Ambulatory Visit (HOSPITAL_COMMUNITY): Payer: BLUE CROSS/BLUE SHIELD | Admitting: Physician Assistant

## 2017-11-03 ENCOUNTER — Observation Stay (HOSPITAL_COMMUNITY)
Admission: RE | Admit: 2017-11-03 | Discharge: 2017-11-04 | Disposition: A | Discharge status: Home / Self Care | Payer: BLUE CROSS/BLUE SHIELD | Source: Ambulatory Visit | Attending: Surgery | Admitting: Surgery

## 2017-11-03 ENCOUNTER — Encounter (HOSPITAL_COMMUNITY): Payer: Self-pay | Admitting: *Deleted

## 2017-11-03 ENCOUNTER — Encounter (HOSPITAL_COMMUNITY): Admission: RE | Disposition: A | Discharge status: Home / Self Care | Payer: Self-pay | Source: Ambulatory Visit

## 2017-11-03 DIAGNOSIS — K420 Umbilical hernia with obstruction, without gangrene: Principal | ICD-10-CM | POA: Insufficient documentation

## 2017-11-03 DIAGNOSIS — I10 Essential (primary) hypertension: Secondary | ICD-10-CM | POA: Diagnosis not present

## 2017-11-03 DIAGNOSIS — K429 Umbilical hernia without obstruction or gangrene: Secondary | ICD-10-CM | POA: Diagnosis present

## 2017-11-03 DIAGNOSIS — Z6841 Body Mass Index (BMI) 40.0 and over, adult: Secondary | ICD-10-CM | POA: Insufficient documentation

## 2017-11-03 DIAGNOSIS — Z87891 Personal history of nicotine dependence: Secondary | ICD-10-CM | POA: Diagnosis not present

## 2017-11-03 DIAGNOSIS — G4730 Sleep apnea, unspecified: Secondary | ICD-10-CM | POA: Insufficient documentation

## 2017-11-03 DIAGNOSIS — Z9989 Dependence on other enabling machines and devices: Secondary | ICD-10-CM | POA: Diagnosis not present

## 2017-11-03 HISTORY — PX: INSERTION OF MESH: SHX5868

## 2017-11-03 HISTORY — PX: UMBILICAL HERNIA REPAIR: SHX196

## 2017-11-03 LAB — CBC
HEMATOCRIT: 39.3 % (ref 39.0–52.0)
Hemoglobin: 11.8 g/dL — ABNORMAL LOW (ref 13.0–17.0)
MCH: 26.2 pg (ref 26.0–34.0)
MCHC: 30 g/dL (ref 30.0–36.0)
MCV: 87.3 fL (ref 78.0–100.0)
Platelets: 173 10*3/uL (ref 150–400)
RBC: 4.5 MIL/uL (ref 4.22–5.81)
RDW: 12.3 % (ref 11.5–15.5)
WBC: 7.3 10*3/uL (ref 4.0–10.5)

## 2017-11-03 LAB — BASIC METABOLIC PANEL
Anion gap: 8 (ref 5–15)
BUN: 15 mg/dL (ref 6–20)
CALCIUM: 8.8 mg/dL — AB (ref 8.9–10.3)
CHLORIDE: 110 mmol/L (ref 98–111)
CO2: 26 mmol/L (ref 22–32)
CREATININE: 0.92 mg/dL (ref 0.61–1.24)
GFR calc Af Amer: >60 mL/min (ref >60)
GFR calc non Af Amer: >60 mL/min (ref >60)
GLUCOSE: 113 mg/dL — AB (ref 70–99)
Potassium: 3.6 mmol/L (ref 3.5–5.1)
Sodium: 144 mmol/L (ref 135–145)

## 2017-11-03 SURGERY — REPAIR, HERNIA, UMBILICAL, ADULT
Anesthesia: General

## 2017-11-03 MED ORDER — SIMETHICONE 80 MG PO CHEW: 40.0000 mg | CHEWABLE_TABLET | Freq: Four times a day (QID) | ORAL | Status: DC | PRN

## 2017-11-03 MED ORDER — IBUPROFEN 800 MG PO TABS: 800.0000 mg | ORAL_TABLET | Freq: Three times a day (TID) | ORAL | 0 refills | Status: DC | PRN

## 2017-11-03 MED ORDER — HYDROCHLOROTHIAZIDE 25 MG PO TABS
25.0000 mg | ORAL_TABLET | Freq: Every day | ORAL | Status: DC
  Administered 2017-11-03 – 2017-11-04 (×2): 25 mg via ORAL
  Filled 2017-11-03 (×2): qty 1

## 2017-11-03 MED ORDER — OXYCODONE HCL 5 MG PO TABS: 5.0000 mg | ORAL_TABLET | Freq: Once | ORAL | Status: DC | PRN

## 2017-11-03 MED ORDER — ONDANSETRON HCL 4 MG/2ML IJ SOLN
INTRAMUSCULAR | Status: AC
  Filled 2017-11-03: qty 2

## 2017-11-03 MED ORDER — OXYCODONE HCL 5 MG PO TABS
5.0000 mg | ORAL_TABLET | ORAL | Status: DC | PRN
  Administered 2017-11-04: 5 mg via ORAL
  Filled 2017-11-03: qty 1

## 2017-11-03 MED ORDER — ROCURONIUM BROMIDE 50 MG/5ML IV SOSY
PREFILLED_SYRINGE | INTRAVENOUS | Status: AC
  Filled 2017-11-03: qty 5

## 2017-11-03 MED ORDER — MORPHINE SULFATE (PF) 2 MG/ML IV SOLN
1.0000 mg | INTRAVENOUS | Status: DC | PRN
  Administered 2017-11-03 (×2): 1 mg via INTRAVENOUS
  Filled 2017-11-03 (×2): qty 1

## 2017-11-03 MED ORDER — ENOXAPARIN SODIUM 40 MG/0.4ML ~~LOC~~ SOLN
40.0000 mg | SUBCUTANEOUS | Status: DC
  Administered 2017-11-03: 40 mg via SUBCUTANEOUS
  Filled 2017-11-03: qty 0.4

## 2017-11-03 MED ORDER — ONDANSETRON HCL 4 MG/2ML IJ SOLN
INTRAMUSCULAR | Status: DC | PRN
  Administered 2017-11-03: 4 mg via INTRAVENOUS

## 2017-11-03 MED ORDER — FENTANYL CITRATE (PF) 100 MCG/2ML IJ SOLN
INTRAMUSCULAR | Status: AC
  Administered 2017-11-03: 50 ug via INTRAVENOUS
  Filled 2017-11-03: qty 2

## 2017-11-03 MED ORDER — FENTANYL CITRATE (PF) 250 MCG/5ML IJ SOLN
INTRAMUSCULAR | Status: AC
  Filled 2017-11-03: qty 5

## 2017-11-03 MED ORDER — PROPOFOL 10 MG/ML IV BOLUS
INTRAVENOUS | Status: AC
  Filled 2017-11-03: qty 20

## 2017-11-03 MED ORDER — CELECOXIB 200 MG PO CAPS
200.0000 mg | ORAL_CAPSULE | ORAL | Status: AC
  Administered 2017-11-03: 200 mg via ORAL
  Filled 2017-11-03: qty 1

## 2017-11-03 MED ORDER — ACETAMINOPHEN 500 MG PO TABS
1000.0000 mg | ORAL_TABLET | ORAL | Status: AC
  Administered 2017-11-03: 1000 mg via ORAL
  Filled 2017-11-03: qty 2

## 2017-11-03 MED ORDER — ROCURONIUM BROMIDE 10 MG/ML (PF) SYRINGE
PREFILLED_SYRINGE | INTRAVENOUS | Status: DC | PRN
  Administered 2017-11-03: 50 mg via INTRAVENOUS
  Administered 2017-11-03: 20 mg via INTRAVENOUS
  Administered 2017-11-03: 30 mg via INTRAVENOUS

## 2017-11-03 MED ORDER — ACETAMINOPHEN 500 MG PO TABS
1000.0000 mg | ORAL_TABLET | Freq: Four times a day (QID) | ORAL | Status: DC
  Administered 2017-11-03 – 2017-11-04 (×3): 1000 mg via ORAL
  Filled 2017-11-03 (×3): qty 2

## 2017-11-03 MED ORDER — ONDANSETRON 4 MG PO TBDP: 4.0000 mg | ORAL_TABLET | Freq: Four times a day (QID) | ORAL | Status: DC | PRN

## 2017-11-03 MED ORDER — FENTANYL CITRATE (PF) 250 MCG/5ML IJ SOLN
INTRAMUSCULAR | Status: DC | PRN
  Administered 2017-11-03: 100 ug via INTRAVENOUS

## 2017-11-03 MED ORDER — OXYCODONE HCL 5 MG PO TABS: 5.0000 mg | ORAL_TABLET | Freq: Four times a day (QID) | ORAL | 0 refills | Status: DC | PRN

## 2017-11-03 MED ORDER — DEXAMETHASONE SODIUM PHOSPHATE 10 MG/ML IJ SOLN
INTRAMUSCULAR | Status: DC | PRN
  Administered 2017-11-03: 10 mg via INTRAVENOUS

## 2017-11-03 MED ORDER — 0.9 % SODIUM CHLORIDE (POUR BTL) OPTIME
TOPICAL | Status: DC | PRN
  Administered 2017-11-03: 1000 mL

## 2017-11-03 MED ORDER — BUPIVACAINE-EPINEPHRINE 0.25% -1:200000 IJ SOLN
INTRAMUSCULAR | Status: DC | PRN
  Administered 2017-11-03: 10 mL

## 2017-11-03 MED ORDER — SUGAMMADEX SODIUM 500 MG/5ML IV SOLN
INTRAVENOUS | Status: DC | PRN
  Administered 2017-11-03: 400 mg via INTRAVENOUS

## 2017-11-03 MED ORDER — SUCCINYLCHOLINE CHLORIDE 20 MG/ML IJ SOLN
INTRAMUSCULAR | Status: DC | PRN
  Administered 2017-11-03: 100 mg via INTRAVENOUS

## 2017-11-03 MED ORDER — MIDAZOLAM HCL 2 MG/2ML IJ SOLN
INTRAMUSCULAR | Status: AC
  Filled 2017-11-03: qty 2

## 2017-11-03 MED ORDER — MIDAZOLAM HCL 2 MG/2ML IJ SOLN
INTRAMUSCULAR | Status: DC | PRN
  Administered 2017-11-03: 2 mg via INTRAVENOUS

## 2017-11-03 MED ORDER — IBUPROFEN 800 MG PO TABS: 800.0000 mg | ORAL_TABLET | Freq: Three times a day (TID) | ORAL | Status: DC | PRN

## 2017-11-03 MED ORDER — CHLORHEXIDINE GLUCONATE CLOTH 2 % EX PADS
6.0000 | MEDICATED_PAD | Freq: Once | CUTANEOUS | Status: DC
  Administered 2017-11-03: 6 via TOPICAL

## 2017-11-03 MED ORDER — DEXAMETHASONE SODIUM PHOSPHATE 10 MG/ML IJ SOLN
INTRAMUSCULAR | Status: AC
  Filled 2017-11-03: qty 1

## 2017-11-03 MED ORDER — SUGAMMADEX SODIUM 500 MG/5ML IV SOLN
INTRAVENOUS | Status: AC
  Filled 2017-11-03: qty 5

## 2017-11-03 MED ORDER — METHOCARBAMOL 500 MG PO TABS: 500.0000 mg | ORAL_TABLET | Freq: Four times a day (QID) | ORAL | Status: DC | PRN

## 2017-11-03 MED ORDER — FENTANYL CITRATE (PF) 100 MCG/2ML IJ SOLN
25.0000 ug | INTRAMUSCULAR | Status: DC | PRN
  Administered 2017-11-03 (×2): 50 ug via INTRAVENOUS

## 2017-11-03 MED ORDER — PROPOFOL 10 MG/ML IV BOLUS
INTRAVENOUS | Status: DC | PRN
Start: 2017-11-03 — End: 2017-11-03
  Administered 2017-11-03: 200 mg via INTRAVENOUS

## 2017-11-03 MED ORDER — PROMETHAZINE HCL 25 MG/ML IJ SOLN: 6.2500 mg | INTRAMUSCULAR | Status: DC | PRN

## 2017-11-03 MED ORDER — SODIUM CHLORIDE 0.9 % IV SOLN
INTRAVENOUS | Status: DC
  Administered 2017-11-03: 18:00:00 via INTRAVENOUS

## 2017-11-03 MED ORDER — LIDOCAINE 2% (20 MG/ML) 5 ML SYRINGE
INTRAMUSCULAR | Status: DC | PRN
Start: 2017-11-03 — End: 2017-11-03
  Administered 2017-11-03: 60 mg via INTRAVENOUS

## 2017-11-03 MED ORDER — LACTATED RINGERS IV SOLN
INTRAVENOUS | Status: DC
  Administered 2017-11-03 (×2): via INTRAVENOUS

## 2017-11-03 MED ORDER — OXYCODONE HCL 5 MG/5ML PO SOLN: 5.0000 mg | Freq: Once | ORAL | Status: DC | PRN

## 2017-11-03 MED ORDER — BUPIVACAINE-EPINEPHRINE (PF) 0.25% -1:200000 IJ SOLN
INTRAMUSCULAR | Status: AC
  Filled 2017-11-03: qty 30

## 2017-11-03 MED ORDER — ONDANSETRON HCL 4 MG/2ML IJ SOLN: 4.0000 mg | Freq: Four times a day (QID) | INTRAMUSCULAR | Status: DC | PRN

## 2017-11-03 MED ORDER — GABAPENTIN 300 MG PO CAPS
300.0000 mg | ORAL_CAPSULE | ORAL | Status: AC
  Administered 2017-11-03: 300 mg via ORAL
  Filled 2017-11-03: qty 1

## 2017-11-03 SURGICAL SUPPLY — 38 items
CANISTER SUCT 3000ML PPV (MISCELLANEOUS) IMPLANT
CHLORAPREP W/TINT 26ML (MISCELLANEOUS) ×2 IMPLANT
COVER SURGICAL LIGHT HANDLE (MISCELLANEOUS) ×2 IMPLANT
DERMABOND ADVANCED (GAUZE/BANDAGES/DRESSINGS) ×1
DERMABOND ADVANCED .7 DNX12 (GAUZE/BANDAGES/DRESSINGS) ×1 IMPLANT
DRAPE LAPAROTOMY 100X72 PEDS (DRAPES) ×2 IMPLANT
DRAPE UTILITY XL STRL (DRAPES) ×2 IMPLANT
ELECT CAUTERY BLADE 6.4 (BLADE) ×2 IMPLANT
ELECT REM PT RETURN 9FT ADLT (ELECTROSURGICAL) ×2
ELECTRODE REM PT RTRN 9FT ADLT (ELECTROSURGICAL) ×1 IMPLANT
GAUZE SPONGE 4X4 12PLY STRL (GAUZE/BANDAGES/DRESSINGS) IMPLANT
GLOVE BIO SURGEON STRL SZ8 (GLOVE) ×2 IMPLANT
GLOVE BIOGEL PI IND STRL 8 (GLOVE) ×2 IMPLANT
GLOVE BIOGEL PI INDICATOR 8 (GLOVE) ×2
GLOVE ECLIPSE 8.0 STRL XLNG CF (GLOVE) ×2 IMPLANT
GOWN STRL REUS W/ TWL LRG LVL3 (GOWN DISPOSABLE) ×1 IMPLANT
GOWN STRL REUS W/ TWL XL LVL3 (GOWN DISPOSABLE) ×1 IMPLANT
GOWN STRL REUS W/TWL LRG LVL3 (GOWN DISPOSABLE) ×1
GOWN STRL REUS W/TWL XL LVL3 (GOWN DISPOSABLE) ×1
KIT BASIN OR (CUSTOM PROCEDURE TRAY) ×2 IMPLANT
KIT TURNOVER KIT B (KITS) ×2 IMPLANT
MESH VENTRALEX ST 1-7/10 CRC S (Mesh General) ×2 IMPLANT
NEEDLE HYPO 25GX1X1/2 BEV (NEEDLE) ×2 IMPLANT
NS IRRIG 1000ML POUR BTL (IV SOLUTION) ×2 IMPLANT
PACK SURGICAL SETUP 50X90 (CUSTOM PROCEDURE TRAY) ×2 IMPLANT
PAD ARMBOARD 7.5X6 YLW CONV (MISCELLANEOUS) ×2 IMPLANT
PENCIL BUTTON HOLSTER BLD 10FT (ELECTRODE) ×2 IMPLANT
SPONGE LAP 18X18 X RAY DECT (DISPOSABLE) ×2 IMPLANT
SUT MON AB 4-0 PC3 18 (SUTURE) ×2 IMPLANT
SUT NOVA NAB DX-16 0-1 5-0 T12 (SUTURE) ×4 IMPLANT
SUT VIC AB 3-0 SH 27 (SUTURE) ×1
SUT VIC AB 3-0 SH 27X BRD (SUTURE) ×1 IMPLANT
SYR BULB IRRIGATION 50ML (SYRINGE) ×2 IMPLANT
SYR CONTROL 10ML LL (SYRINGE) ×2 IMPLANT
TOWEL OR 17X24 6PK STRL BLUE (TOWEL DISPOSABLE) ×2 IMPLANT
TOWEL OR 17X26 10 PK STRL BLUE (TOWEL DISPOSABLE) ×2 IMPLANT
TUBE CONNECTING 12X1/4 (SUCTIONS) IMPLANT
YANKAUER SUCT BULB TIP NO VENT (SUCTIONS) IMPLANT

## 2017-11-03 NOTE — Discharge Instructions (Signed)
[facility] _______[facility]  UMBILICAL OR INGUINAL HERNIA REPAIR: POST OP INSTRUCTIONS     Always review your discharge instruction sheet given to you by the facility where your surgery was performed. IF YOU HAVE DISABILITY OR FAMILY LEAVE FORMS, YOU MUST BRING THEM TO THE OFFICE FOR PROCESSING.   DO NOT GIVE THEM TO YOUR DOCTOR.  1. A  prescription for pain medication may be given to you upon discharge.  Take your pain medication as prescribed, if needed.  If narcotic pain medicine is not needed, then you may take acetaminophen (Tylenol) or ibuprofen (Advil) as needed. 2. Take your usually prescribed medications unless otherwise directed. If you need a refill on your pain medication, please contact your pharmacy.  They will contact our office to request authorization. Prescriptions will not be filled after 5 pm or on week-ends. 3. You should follow a light diet the first 24 hours after arrival home, such as soup and crackers, etc.  Be sure to include lots of fluids daily.  Resume your normal diet the day after surgery. 4.Most patients will experience some swelling and bruising around the umbilicus or in the groin and scrotum.  Ice packs and reclining will help.  Swelling and bruising can take several days to resolve.  6. It is common to experience some constipation if taking pain medication after surgery.  Increasing fluid intake and taking a stool softener (such as Colace) will usually help or prevent this problem from occurring.  A mild laxative (Milk of Magnesia or Miralax) should be taken according to package directions if there are no bowel movements after 48 hours. 7. Unless discharge instructions indicate otherwise, you may remove your bandages 24-48 hours after surgery, and you may shower at that time.  You may have steri-strips (small skin tapes) in place directly over the incision.  These strips should be left on the skin for 7-10 days.  If your surgeon used skin glue on the  incision, you may shower in 24 hours.  The glue will flake off over the next 2-3 weeks.  Any sutures or staples will be removed at the office during your follow-up visit. 8. ACTIVITIES:  You may resume regular (light) daily activities beginning the next day--such as daily self-care, walking, climbing stairs--gradually increasing activities as tolerated.  You may have sexual intercourse when it is comfortable.  Refrain from any heavy lifting or straining until approved by your doctor.  a.You may drive when you are no longer taking prescription pain medication, you can comfortably wear a seatbelt, and you can safely maneuver your car and apply brakes. b.RETURN TO WORK:   _________out of work 4 weeks  ____________________________________  9.You should see your doctor in the office for a follow-up appointment approximately 2-3 weeks after your surgery.  Make sure that you call for this appointment within a day or two after you arrive home to insure a convenient appointment time. 10.OTHER INSTRUCTIONS: _________________________    _______________________can lift up to 10 lbs ______________  WHEN TO CALL YOUR DOCTOR: 1. Fever over 101.0 2. Inability to urinate 3. Nausea and/or vomiting 4. Extreme swelling or bruising 5. Continued bleeding from incision. 6. Increased pain, redness, or drainage from the incision  The clinic staff is available to answer your questions during regular business hours.  Please dont hesitate to call and ask to speak to one of the nurses for clinical concerns.  If you have a medical emergency, go to the nearest emergency room or call 911.  A surgeon from [facility] is always on call at the hospital   [address] [place], [place], [place]  [address] ?  [address], [place], [place]   [address] [phone] ? [phone] ? FAX [phone] Web site: [email or web address]

## 2017-11-03 NOTE — Progress Notes (Addendum)
Patient set up on CPAP auto mode with 2L 02 bleed in. Patient tolerating well, no issues at this time. Sats 97% with Hr at 86 and rr at 20

## 2017-11-03 NOTE — Transfer of Care (Signed)
Immediate Anesthesia Transfer of Care Note  Patient: Luis Lopez  Procedure(s) Performed: UMBILICAL HERNIA REPAIR WITH MESH (N/A ) INSERTION OF MESH (N/A )  Patient Location: PACU  Anesthesia Type:General  Level of Consciousness: drowsy  Airway & Oxygen Therapy: Patient Spontanous Breathing and Patient connected to face mask oxygen  Post-op Assessment: Report given to RN and Post -op Vital signs reviewed and stable  Post vital signs: Reviewed and stable  Last Vitals:  Vitals Value Taken Time  BP 149/91 11/03/2017  3:29 PM  Temp    Pulse 84 11/03/2017  3:33 PM  Resp 16 11/03/2017  3:33 PM  SpO2 99 % 11/03/2017  3:33 PM  Vitals shown include unvalidated device data.  Last Pain:  Vitals:   11/03/17 1141  TempSrc:   PainSc: 5          Complications: No apparent anesthesia complications

## 2017-11-03 NOTE — Anesthesia Procedure Notes (Addendum)
Procedure Name: Intubation Date/Time: 11/03/2017 2:18 PM Performed by: Bryson Corona, CRNA Pre-anesthesia Checklist: Patient identified, Emergency Drugs available, Suction available and Patient being monitored Patient Re-evaluated:Patient Re-evaluated prior to induction Oxygen Delivery Method: Circle System Utilized Preoxygenation: Pre-oxygenation with 100% oxygen Induction Type: IV induction Ventilation: Oral airway inserted - appropriate to patient size, Two handed mask ventilation required and Mask ventilation without difficulty Laryngoscope Size: Glidescope and 4 Grade View: Grade I Tube type: Oral Tube size: 7.5 mm Number of attempts: 1 Airway Equipment and Method: Stylet and Oral airway Placement Confirmation: ETT inserted through vocal cords under direct vision,  positive ETCO2 and breath sounds checked- equal and bilateral Secured at: 23 cm Tube secured with: Tape Dental Injury: Teeth and Oropharynx as per pre-operative assessment  Comments: SRNA attempted intubation with Miller 3, but poor view. SRNA then used Glidescope which provided Grade I view. Pt has large tonsillar tissue.

## 2017-11-03 NOTE — Op Note (Signed)
Luis Lopez 07/26/1975 [id number] 11/03/2017  Preoperative diagnosis: umbilical hernia  Incarcerated   Postoperative diagnosis: same   Procedure: Umbilical Hernia Repair with mesh   Surgeon: Turner Daniels, MD, FACS  Anesthesia: General and local     EBL: 10 CC  IVF:  Per anesthesia record.     Clinical History and Indications: Pt presents with  Long standing umblical hernia diagnosed by ED  And CT .  He had omentum incarcerated in the hernia.  He presents for repair.The risk of hernia repair include bleeding,  Infection,   Recurrence of the hernia,  Mesh use, chronic pain,  Organ injury,  Bowel injury,  Bladder injury,   nerve injury with numbness around the incision,  Death,  and worsening of preexisting  medical problems.  The alternatives to surgery have been discussed as well..  Long term expectations of both operative and non operative treatments have been discussed.   The patient agrees to proceed.  Procedure: The patient was seen in the preoperative area and the plans for the procedure reviewed again. He  had no further questions. I marked the area of the umbilicus as the operative site. He wishes to prodeed.  The patient was taken to the operating room and after satisfactory general  anesthesia had been obtained the area was clipped as needed, prepped and draped. The timeout was performed.  I used some 0.25 % bupivicaine with epinephrine  anesthesia to help with postoperative pain management. This was infiltrated around the umbilical area and additional infiltrated as I worked.  A curvilinear incision was made on the inferior aspect of the umbilicus. The umbilical skin was elevated off of the hernia sac. The hernia sac was dissected free of the subcutaneous tissues.  1 cm defect noted.  Incarcerated omentum noted and reduced.  4.3 cm mesh placed and secured with 1 - 0 novafil in subfascial position. FASCIA CLOSED WITH 1 - O NOVAFIL.   Once the repair was complete the incision  was closed by using some 3-0 Vicryl subcutaneous and 4-0 Monocryl subcuticular sutures.  The patient tolerated the procedure well. There were no operative complications. There was minimal blood loss. All counts were correct. He was taken to the PACU in satisfactory condition.  Turner Daniels , MD, FACS 11/03/2017 3:18 PM

## 2017-11-03 NOTE — Progress Notes (Signed)
Orthopedic Tech Progress Note Patient Details:  Luis Lopez Sep 07, 1975 [id number]  Ortho Devices Type of Ortho Device: Abdominal binder Ortho Device/Splint Interventions: Application   Post Interventions Patient Tolerated: Well Instructions Provided: Care of device   [name] 11/03/2017, 4:18 PM

## 2017-11-03 NOTE — Interval H&P Note (Signed)
History and Physical Interval Note:  11/03/2017 2:01 PM  Luis Lopez  has presented today for surgery, with the diagnosis of UMBILICAL HERNIA  The various methods of treatment have been discussed with the patient and family. After consideration of risks, benefits and other options for treatment, the patient has consented to  Procedure(s): UMBILICAL HERNIA REPAIR WITH MESH (N/A) INSERTION OF MESH (N/A) as a surgical intervention .  The patient's history has been reviewed, patient examined, no change in status, stable for surgery.  I have reviewed the patient's chart and labs.  Questions were answered to the patient's satisfaction.     [place]

## 2017-11-03 NOTE — Anesthesia Postprocedure Evaluation (Signed)
Anesthesia Post Note  Patient: Marzell Isakson  Procedure(s) Performed: UMBILICAL HERNIA REPAIR WITH MESH (N/A ) INSERTION OF MESH (N/A )     Patient location during evaluation: PACU Anesthesia Type: General Level of consciousness: awake and alert Pain management: pain level controlled Vital Signs Assessment: post-procedure vital signs reviewed and stable Respiratory status: spontaneous breathing, nonlabored ventilation, respiratory function stable and patient connected to nasal cannula oxygen Cardiovascular status: blood pressure returned to baseline and stable Postop Assessment: no apparent nausea or vomiting Anesthetic complications: no    Last Vitals:  Vitals:   11/03/17 1600 11/03/17 1630  BP: 132/83 (!) 141/85  Pulse: 83 78  Resp: 13 13  Temp:    SpO2: 93% 91%    Last Pain:  Vitals:   11/03/17 1700  TempSrc:   PainSc: 5                  Deitrich Steve,W. EDMOND

## 2017-11-03 NOTE — Anesthesia Preprocedure Evaluation (Addendum)
Anesthesia Evaluation  Patient identified by MRN, date of birth, ID band Patient awake    Reviewed: Allergy & Precautions, H&P , NPO status , Patient's Chart, lab work & pertinent test results  History of Anesthesia Complications Negative for: history of anesthetic complications  Airway Mallampati: III  TM Distance: >3 FB Neck ROM: Full    Dental no notable dental hx. (+) Teeth Intact, Dental Advisory Given   Pulmonary sleep apnea and Continuous Positive Airway Pressure Ventilation , former smoker,    Pulmonary exam normal breath sounds clear to auscultation       Cardiovascular hypertension, Pt. on medications  Rhythm:Regular Rate:Normal     Neuro/Psych negative neurological ROS  negative psych ROS   GI/Hepatic negative GI ROS, Neg liver ROS,   Endo/Other  Morbid obesity  Renal/GU negative Renal ROS  negative genitourinary   Musculoskeletal negative musculoskeletal ROS (+)   Abdominal (+) + obese,   Peds  Hematology negative hematology ROS (+)   Anesthesia Other Findings   Reproductive/Obstetrics negative OB ROS                            Anesthesia Physical Anesthesia Plan  ASA: III  Anesthesia Plan: General   Post-op Pain Management:    Induction: Intravenous  PONV Risk Score and Plan: 3 and Treatment may vary due to age or medical condition, Ondansetron, Dexamethasone and Midazolam  Airway Management Planned: Oral ETT  Additional Equipment: None  Intra-op Plan:   Post-operative Plan: Extubation in OR  Informed Consent: I have reviewed the patients History and Physical, chart, labs and discussed the procedure including the risks, benefits and alternatives for the proposed anesthesia with the patient or authorized representative who has indicated his/her understanding and acceptance.   Dental advisory given  Plan Discussed with: CRNA and Anesthesiologist  Anesthesia  Plan Comments:        Anesthesia Quick Evaluation

## 2017-11-04 ENCOUNTER — Encounter (HOSPITAL_COMMUNITY): Payer: Self-pay | Admitting: Surgery

## 2017-11-04 DIAGNOSIS — G4730 Sleep apnea, unspecified: Secondary | ICD-10-CM | POA: Diagnosis not present

## 2017-11-04 DIAGNOSIS — Z87891 Personal history of nicotine dependence: Secondary | ICD-10-CM | POA: Diagnosis not present

## 2017-11-04 DIAGNOSIS — I10 Essential (primary) hypertension: Secondary | ICD-10-CM | POA: Diagnosis not present

## 2017-11-04 DIAGNOSIS — K420 Umbilical hernia with obstruction, without gangrene: Secondary | ICD-10-CM | POA: Diagnosis not present

## 2017-11-04 DIAGNOSIS — Z9989 Dependence on other enabling machines and devices: Secondary | ICD-10-CM | POA: Diagnosis not present

## 2017-11-04 NOTE — Discharge Summary (Signed)
Physician Discharge Summary  Patient ID: Luis Lopez MRN: [id number] DOB/AGE: 11-02-1975 42 y.o.  Admit date: 11/03/2017 Discharge date: 11/04/2017  Admission Diagnoses: Obesity OSA [facility]  Discharge Diagnoses:  Active Problems:   Umbilical hernia   Discharged Condition: good  Hospital Course: 56 yom s/p primary uh repair who was kept due to respiratory issues. These are better today and will be discharged  Consults: None  Significant Diagnostic Studies: none  Treatments: surgery: uh repair  Discharge Exam: Blood pressure 130/81, pulse 65, temperature 98 F (36.7 C), temperature source Oral, resp. rate 16, height 6\' 3"  (1.905 m), weight (!) 160.8 kg, SpO2 97 %. Resp: clear to auscultation bilaterally Cardio: regular rate and rhythm GI: soft incision clean  Disposition:   Discharge Instructions    Diet - low sodium heart healthy   Complete by:  As directed    Increase activity slowly   Complete by:  As directed         Signed: Rolm Bookbinder 11/04/2017, 7:58 AM

## 2017-11-04 NOTE — Progress Notes (Signed)
Patient discharged to home with instructions. 

## 2017-12-15 ENCOUNTER — Other Ambulatory Visit: Payer: Self-pay | Admitting: Surgery

## 2017-12-15 DIAGNOSIS — R109 Unspecified abdominal pain: Secondary | ICD-10-CM

## 2017-12-16 ENCOUNTER — Ambulatory Visit
Admission: RE | Admit: 2017-12-16 | Discharge: 2017-12-16 | Disposition: A | Discharge status: Home / Self Care | Payer: BLUE CROSS/BLUE SHIELD | Source: Ambulatory Visit | Attending: Surgery | Admitting: Surgery

## 2017-12-16 DIAGNOSIS — R109 Unspecified abdominal pain: Secondary | ICD-10-CM

## 2017-12-16 DIAGNOSIS — K760 Fatty (change of) liver, not elsewhere classified: Secondary | ICD-10-CM | POA: Diagnosis not present

## 2017-12-16 IMAGING — CT CT ABD-PELV W/ CM
2 of 3 series · 12 of 32 positions shown, 18 images · IV contrast (APPLIED)
Comparison: [DATE]

CLINICAL DATA: Periumbilical pain and discomfort

EXAM:
CT ABDOMEN AND PELVIS WITH CONTRAST
TECHNIQUE: Multidetector CT imaging of the abdomen and pelvis was performed
using the standard protocol following bolus administration of
intravenous contrast. Oral contrast was also administered.
CONTRAST:  125 mL [4D] IOPAMIDOL ([4D]) INJECTION 61%

[Series 2: abd/pelvis w/cm · axial · 1.27mm/px · z∈[-508,-28]mm · 10 of 118 slices shown, 16 images]
[im 11/118  soft-tissue]
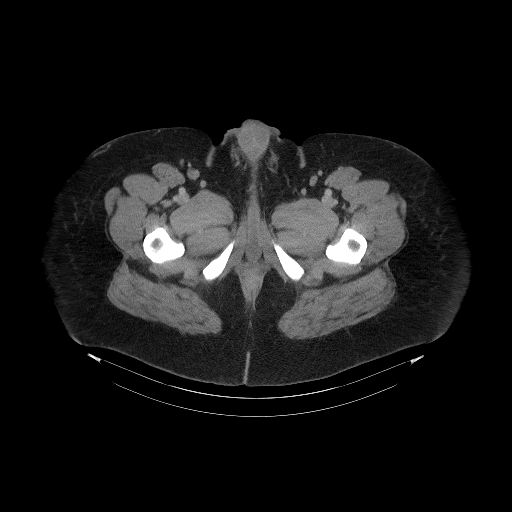
[im 11/118  bone]
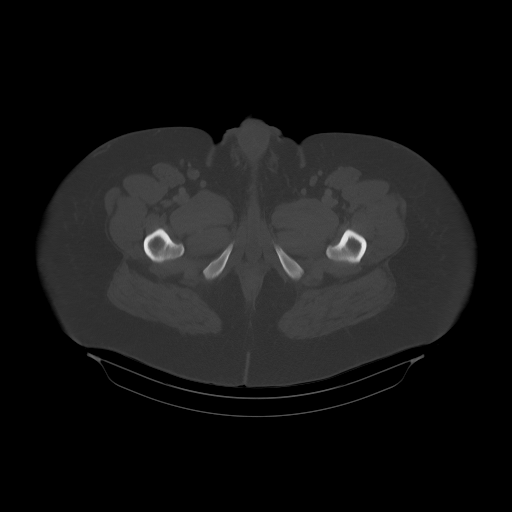
[im 22/118  soft-tissue]
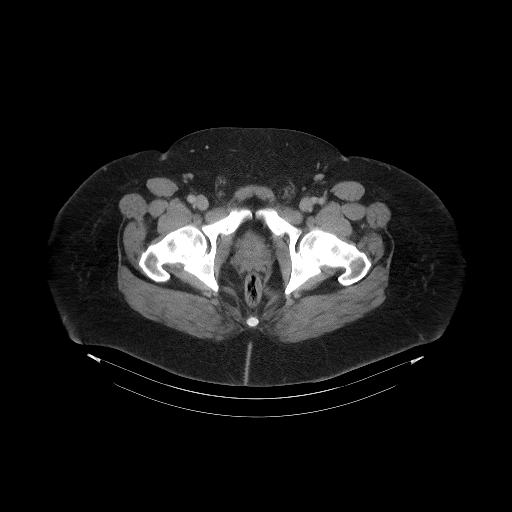
[im 32/118  soft-tissue]
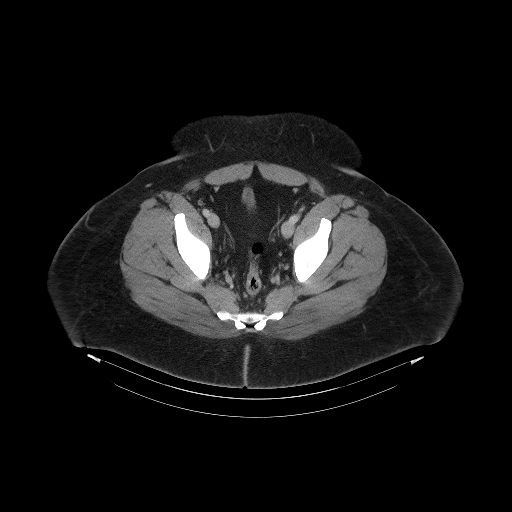
[im 43/118  soft-tissue]
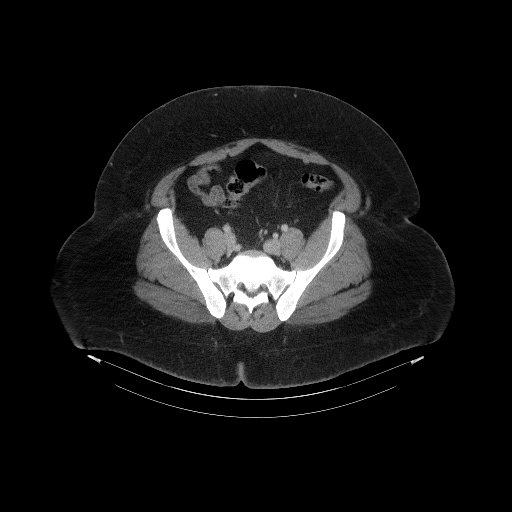
[im 54/118  soft-tissue]
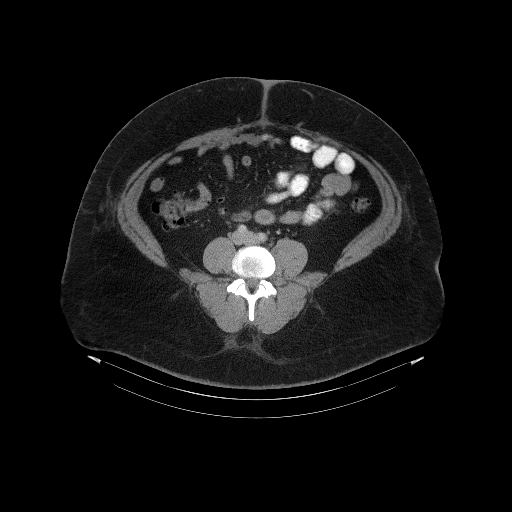
[im 64/118  soft-tissue]
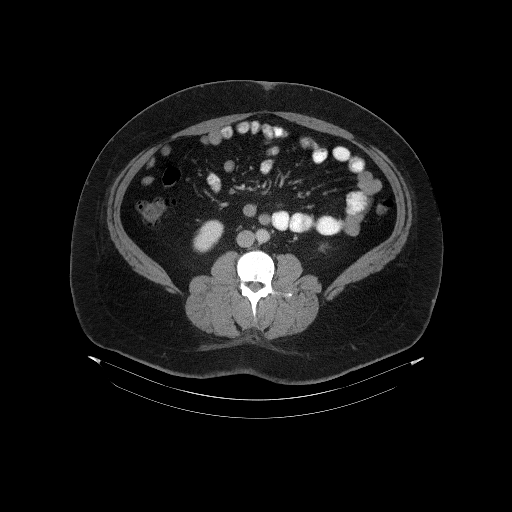
[im 75/118  soft-tissue]
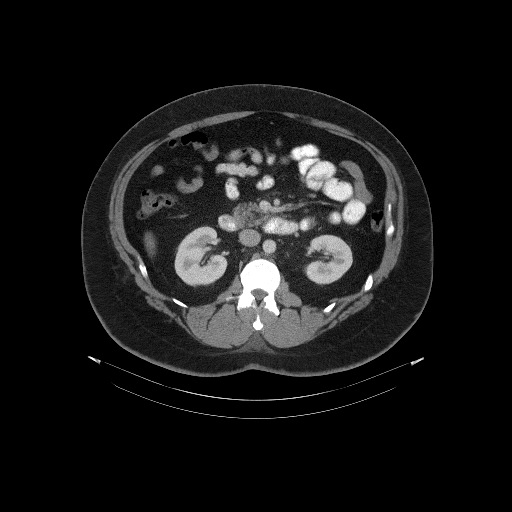
[im 75/118  lung]
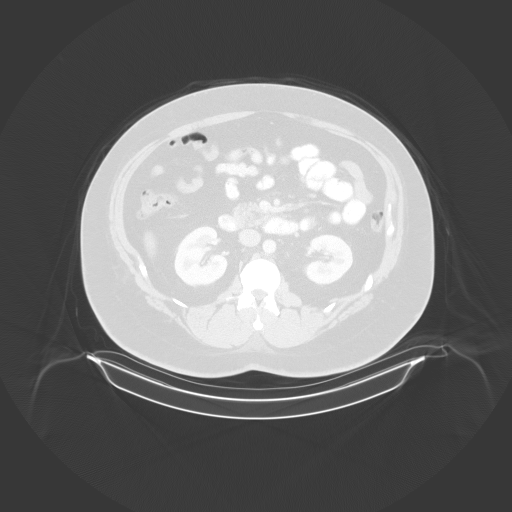
[im 86/118  soft-tissue]
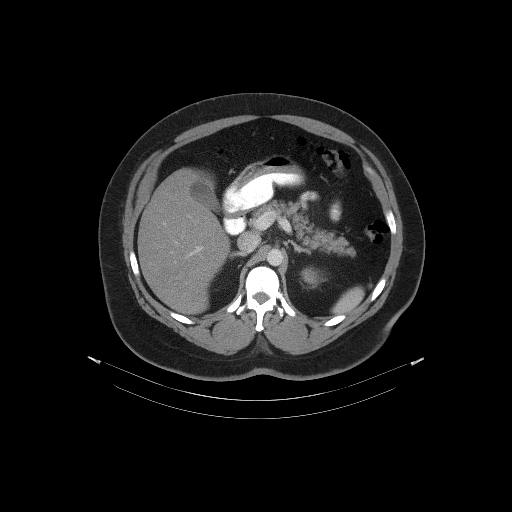
[im 86/118  lung]
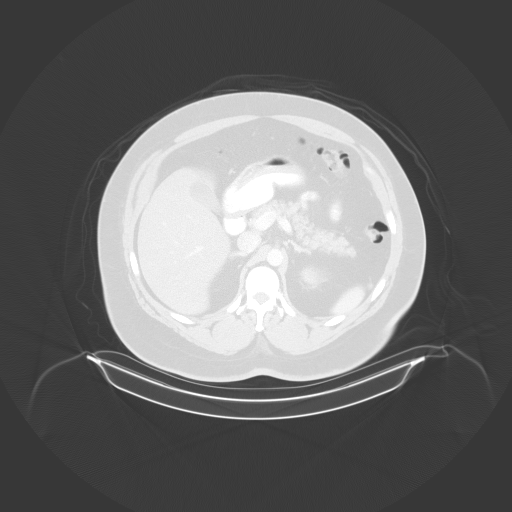
[im 96/118  soft-tissue]
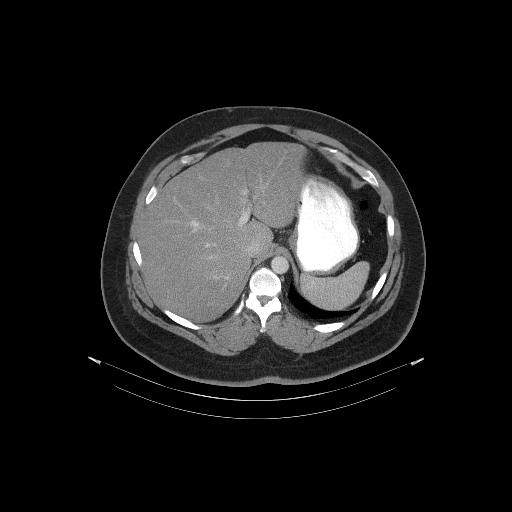
[im 96/118  lung]
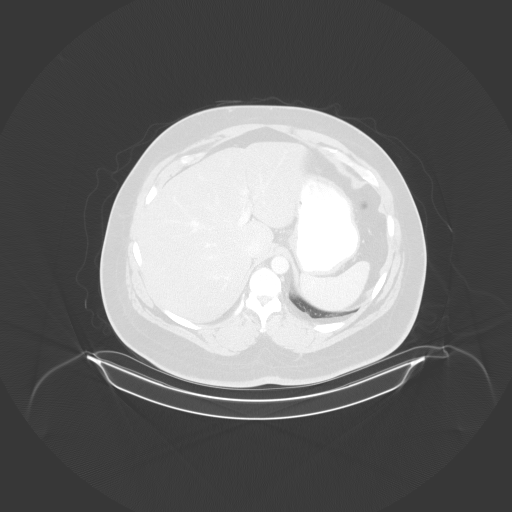
[im 96/118  bone]
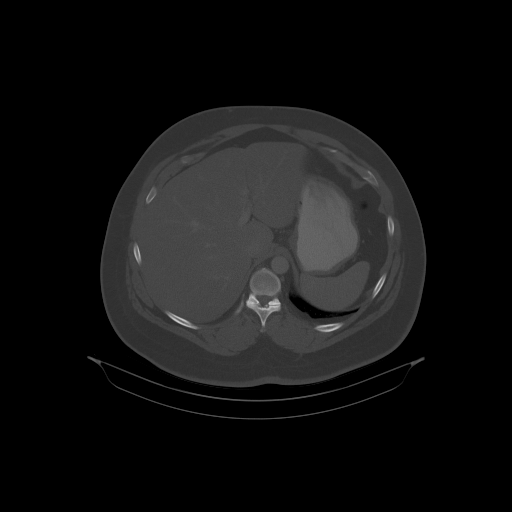
[im 107/118  soft-tissue]
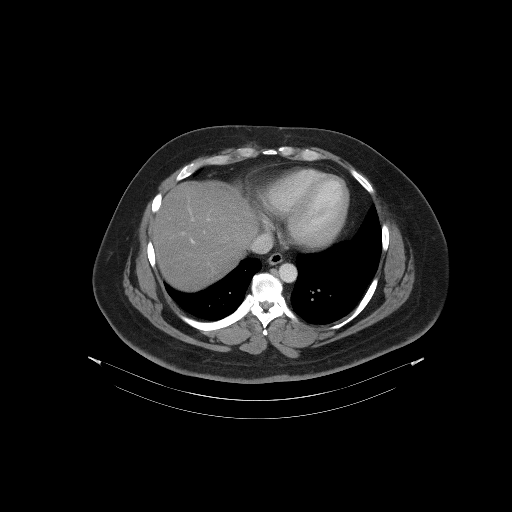
[im 107/118  lung]
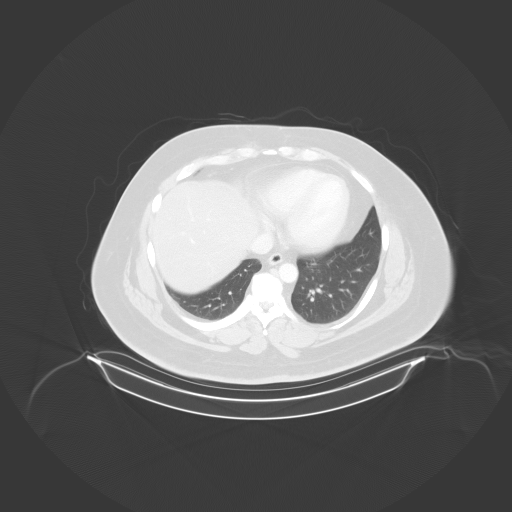

[Series 4: lung · axial · 0.98mm/px · z∈[-135,-111]mm · 2 of 93 slices shown]
[im 12/93  bone]
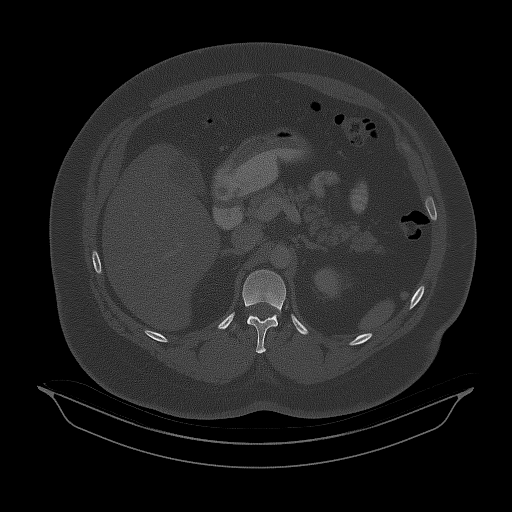
[im 24/93  bone]
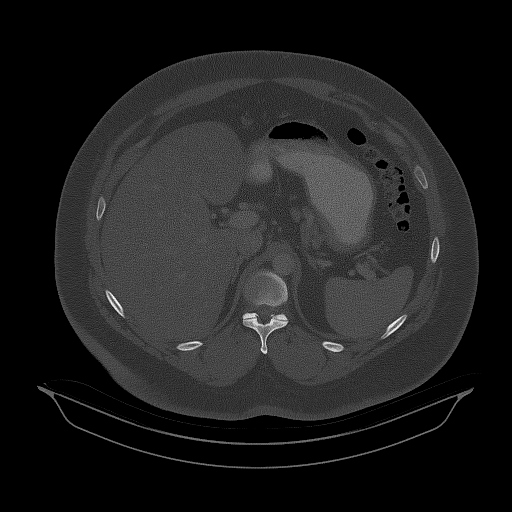

[12 of 32 positions shown; findings below may reference images not displayed]

FINDINGS: Lower chest: Lung bases are clear.

Hepatobiliary: There is hepatic steatosis. No focal liver lesions
are evident. Gallbladder wall is not appreciably thickened. There is
no biliary duct dilatation.

Pancreas: No evident pancreatic mass or inflammatory focus.

Spleen: No splenic lesions are evident.

Adrenals/Urinary Tract: Adrenals bilaterally appear normal. Kidneys
bilaterally show no evident mass or hydronephrosis on either side.
There is no renal or ureteral calculus on either side. There is
thickening of the urinary bladder wall diffusely.

Stomach/Bowel: There is no appreciable bowel wall or mesenteric
thickening. There is no evident bowel obstruction. No free air or
portal venous air.

Vascular/Lymphatic: No abdominal aortic aneurysm. No vascular
lesions are appreciable. There is no evident adenopathy in the
abdomen or pelvis.

Reproductive: Prostate and seminal vesicles appear normal in size
and contour. No evident pelvic mass.

Other: Appendix appears normal. No abscess or ascites is evident in
the abdomen or pelvis. There is scarring in the umbilical region
with evidence of postoperative change in this area. No hernia
currently evident in this area. No abnormal fluid collection in this
area.

Musculoskeletal: No blastic or lytic bone lesions are evident. There
is no intramuscular lesion.
IMPRESSION: 1. Scarring in the umbilicus region. No abnormal fluid collection or
hernia in this area.

2. Urinary bladder wall thickening, indicative of cystitis. No
hydronephrosis on either side. No renal or ureteral calculus on
either side.

3.  Hepatic steatosis.  No focal liver lesions evident.

4. No bowel wall thickening or bowel obstruction. No abscess in the
abdomen or pelvis. Appendix appears normal.

## 2017-12-16 MED ORDER — IOPAMIDOL (ISOVUE-300) INJECTION 61%
125.0000 mL | Freq: Once | INTRAVENOUS | Status: AC | PRN
  Administered 2017-12-16: 125 mL via INTRAVENOUS

## 2018-01-27 ENCOUNTER — Encounter: Payer: BLUE CROSS/BLUE SHIELD | Admitting: Nurse Practitioner

## 2018-03-01 ENCOUNTER — Encounter: Payer: Self-pay | Admitting: Nurse Practitioner

## 2018-03-01 ENCOUNTER — Ambulatory Visit: Payer: BLUE CROSS/BLUE SHIELD | Admitting: Nurse Practitioner

## 2018-03-01 VITALS — BP 160/100 | HR 89 | Temp 97.5°F | Ht 75.0 in | Wt 359.4 lb

## 2018-03-01 DIAGNOSIS — I4949 Other premature depolarization: Secondary | ICD-10-CM

## 2018-03-01 DIAGNOSIS — R9431 Abnormal electrocardiogram [ECG] [EKG]: Secondary | ICD-10-CM

## 2018-03-01 DIAGNOSIS — I10 Essential (primary) hypertension: Secondary | ICD-10-CM | POA: Diagnosis not present

## 2018-03-01 HISTORY — DX: Abnormal electrocardiogram (ECG) (EKG): R94.31

## 2018-03-01 NOTE — Patient Instructions (Addendum)

## 2018-03-01 NOTE — Progress Notes (Signed)
Subjective:     Patient ID: Luis Lopez , male    DOB: [birth year] , 42 y.o.   MRN: [id number]   Chief Complaint  Patient presents with  . Abnormal ECG    HPI  He went for a physical for work and while listening to his heart was skipping a beat.  No chest pain or shortness of breath.  His wife feels he has had a dizzy spell in the last couple months.  He is using his CPAP nightly.  Has been under increased stress (family).  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   Denies family history of heart problems.    Hypertension  This is a chronic problem. The current episode started more than 1 year ago. The problem has been gradually worsening since onset. The problem is uncontrolled. Pertinent negatives include no anxiety, headaches, malaise/fatigue, palpitations, peripheral edema, shortness of breath or sweats. There are no associated agents to hypertension. There are no known risk factors for coronary artery disease.     Past Medical History:  Diagnosis Date  . Hypertension    borderline  . Sleep apnea    uses a cpap  . Snoring      Family History  Problem Relation Age of Onset  . Diabetes Father   . Diabetes Brother      Current Outpatient Medications:  .  docusate sodium (COLACE) 100 MG capsule, Take 1 capsule (100 mg total) by mouth every 12 (twelve) hours., Disp: 60 capsule, Rfl: 0 .  hydrochlorothiazide (HYDRODIURIL) 25 MG tablet, Take 1 tablet (25 mg total) by mouth daily., Disp: 30 tablet, Rfl: 0 .  ibuprofen (ADVIL,MOTRIN) 800 MG tablet, Take 800 mg by mouth every 6 (six) hours as needed. for pain, Disp: , Rfl: 0 .  ibuprofen (ADVIL,MOTRIN) 800 MG tablet, Take 1 tablet (800 mg total) by mouth every 8 (eight) hours as needed., Disp: 30 tablet, Rfl: 0 .  UNABLE TO FIND, CPAP: At bedtime, Disp: , Rfl:    No Known Allergies   Review of Systems  Constitutional: Negative for malaise/fatigue.  Respiratory: Negative for shortness of breath.   Cardiovascular: Negative for  palpitations.  Musculoskeletal: Negative.   Skin: Negative.   Neurological: Negative.  Negative for headaches.  Hematological: Negative.      Today's Vitals   03/01/18 1439  BP: (!) 160/100  Pulse: 89  Temp: (!) 97.5 F (36.4 C)  TempSrc: Oral  SpO2: 96%  Weight: (!) 359 lb 6.4 oz (163 kg)  Height: 6\' 3"  (1.905 m)   Body mass index is 44.92 kg/m.   Objective:  Physical Exam  Constitutional: He is oriented to person, place, and time. He appears well-developed and well-nourished.  Cardiovascular: Normal rate, S1 normal, S2 normal and normal pulses. A regularly irregular rhythm present.  No murmur heard. Pulmonary/Chest: Effort normal and breath sounds normal. He has no wheezes. He exhibits no tenderness.  Neurological: He is alert and oriented to person, place, and time.  Skin: Skin is warm and dry.         Assessment And Plan:     1. Abnormal EKG  He had an abnormal EKG with PVCs, this is a new finding since he had his last EKG in August  Advised to avoid caffeine  Increase water intake   Take ASA 81 mg daily - Ambulatory referral to Cardiology  2. Premature beats  Irregular heart rhythm noted on exam otherwise exam is normal. - Ambulatory referral to Cardiology  3.  Essential hypertension B/P is poorly controlled. The importance of regular exercise and dietary modification was stressed to the patient.  Stressed importance of losing ten percent of her body weight to help with B/P control.  The weight loss would help with decreasing cardiac and cancer risk as well.   Advised to check blood pressure daily and call with readings, given Rx for blood pressure cuff       [name], FNP

## 2018-04-05 ENCOUNTER — Ambulatory Visit: Payer: BLUE CROSS/BLUE SHIELD | Admitting: Nurse Practitioner

## 2018-04-13 ENCOUNTER — Other Ambulatory Visit: Payer: Self-pay | Admitting: Nurse Practitioner

## 2018-04-28 ENCOUNTER — Encounter: Payer: Self-pay | Admitting: Cardiovascular Disease

## 2018-04-28 ENCOUNTER — Ambulatory Visit: Payer: BLUE CROSS/BLUE SHIELD | Admitting: Cardiovascular Disease

## 2018-04-28 VITALS — BP 136/89 | HR 71 | Ht 75.0 in | Wt 355.6 lb

## 2018-04-28 DIAGNOSIS — I493 Ventricular premature depolarization: Secondary | ICD-10-CM | POA: Diagnosis not present

## 2018-04-28 DIAGNOSIS — I10 Essential (primary) hypertension: Secondary | ICD-10-CM

## 2018-04-28 DIAGNOSIS — I517 Cardiomegaly: Secondary | ICD-10-CM | POA: Diagnosis not present

## 2018-04-28 NOTE — Patient Instructions (Addendum)
Medication Instructions:  Your physician recommends that you continue on your current medications as directed. Please refer to the Current Medication list given to you today.  If you need a refill on your cardiac medications before your next appointment, please call your pharmacy.   Lab work: FASTING LP/CMET/CBC/TSH/FT4/MAGNESIUM SOON   If you have labs (blood work) drawn today and your tests are completely normal, you will receive your results only by: Marland Kitchen MyChart Message (if you have MyChart) OR . A paper copy in the mail If you have any lab test that is abnormal or we need to change your treatment, we will call you to review the results.  Testing/Procedures: Your physician has requested that you have an echocardiogram. Echocardiography is a painless test that uses sound waves to create images of your heart. It provides your doctor with information about the size and shape of your heart and how well your heart's chambers and valves are working. This procedure takes approximately one hour. There are no restrictions for this procedure. [place] [address]  Follow-Up: At [facility], you and your health needs are our priority.  As part of our continuing mission to provide you with exceptional heart care, we have created designated Provider Care Teams.  These Care Teams include your primary Cardiologist (physician) and Advanced Practice Providers (APPs -  Physician Assistants and Nurse Practitioners) who all work together to provide you with the care you need, when you need it. You will need a follow up appointment in 3 months. You may see DR [facility]  or one of the following Advanced Practice Providers on your designated Care Team:   Kerin Ransom, PA-C Roby Lofts, [place] . Sande Rives, PA-C  Any Other Special Instructions Will Be Listed Below (If Applicable).  MONITOR AND LOG YOUR BLOOD PRESSURE AT HOME, BRING READINGS TO FOLLOW UP    Echocardiogram An  echocardiogram is a procedure that uses painless sound waves (ultrasound) to produce an image of the heart. Images from an echocardiogram can provide important information about:  Signs of coronary artery disease (CAD).  Aneurysm detection. An aneurysm is a weak or damaged part of an artery wall that bulges out from the normal force of blood pumping through the body.  Heart size and shape. Changes in the size or shape of the heart can be associated with certain conditions, including heart failure, aneurysm, and CAD.  Heart muscle function.  Heart valve function.  Signs of a past heart attack.  Fluid buildup around the heart.  Thickening of the heart muscle.  A tumor or infectious growth around the heart valves. Tell a health care provider about:  Any allergies you have.  All medicines you are taking, including vitamins, herbs, eye drops, creams, and over-the-counter medicines.  Any blood disorders you have.  Any surgeries you have had.  Any medical conditions you have.  Whether you are pregnant or may be pregnant. What are the risks? Generally, this is a safe procedure. However, problems may occur, including:  Allergic reaction to dye (contrast) that may be used during the procedure. What happens before the procedure? No specific preparation is needed. You may eat and drink normally. What happens during the procedure?   An IV tube may be inserted into one of your veins.  You may receive contrast through this tube. A contrast is an injection that improves the quality of the pictures from your heart.  A gel will be applied to your chest.  A wand-like tool (transducer) will be moved over your chest. The gel will help to transmit the sound waves from the transducer.  The sound waves will harmlessly bounce off of your heart to allow the heart images to be captured in real-time motion. The images will be recorded on a computer. The procedure may vary among health care  providers and hospitals. What happens after the procedure?  You may return to your normal, everyday life, including diet, activities, and medicines, unless your health care provider tells you not to do that. Summary  An echocardiogram is a procedure that uses painless sound waves (ultrasound) to produce an image of the heart.  Images from an echocardiogram can provide important information about the size and shape of your heart, heart muscle function, heart valve function, and fluid buildup around your heart.  You do not need to do anything to prepare before this procedure. You may eat and drink normally.  After the echocardiogram is completed, you may return to your normal, everyday life, unless your health care provider tells you not to do that. This information is not intended to replace advice given to you by your health care provider. Make sure you discuss any questions you have with your health care provider. Document Released: 03/05/2000 Document Revised: 04/10/2016 Document Reviewed: 04/10/2016 Elsevier Interactive Patient Education  2019 [employer].

## 2018-04-28 NOTE — Progress Notes (Signed)
Cardiology Office Note   Date:  04/29/2018   ID:  Lazlo Tunney, DOB [birth year], MRN [id number]  PCP:  [name], FNP  Cardiologist:   Skeet Latch, MD   Chief Complaint  Patient presents with  . Follow-up      History of Present Illness: Luis Lopez is a 43 y.o. male with hypertension and OSA who is being seen today for the evaluation of PVCs at the request of [name], [place].  Luis Lopez saw [name], FNP 02/2018 and reported palpitations.  He had previously been seen in another provider's office where his heart was noted to be irregular.  An EKG was obtained and he was found to have 3 PVCs on top of a baseline of sinus rhythm.  He has not really noted any palpitations.  There was some concern that he may have also had a prior MI.  It was recommended that he start on aspirin.  Since starting aspirin he feels like he has more energy.  He has no chest pain or shortness of breath.  He walks for 1 hour 3 days/week and has no exertional symptoms.  He denies lower extremity edema, orthopnea, or PND.  He does have varicose veins.  He has OSA and uses his CPAP faithfully.  He was diagnosed with hypertension in approximately 2018.  He thinks it has been reasonably well-controlled since that time.  When he last saw his PCP his blood pressure was 160/100.  He thinks he has taken his medication that day.  However he had some coffee and this tends to make his blood pressure rise.  He limits salt in his diet and mostly eats at home.   Past Medical History:  Diagnosis Date  . Hypertension    borderline  . PVC (premature ventricular contraction) 04/29/2018  . Sleep apnea    uses a cpap  . Snoring     Past Surgical History:  Procedure Laterality Date  . INSERTION OF MESH N/A 11/03/2017   Procedure: INSERTION OF MESH;  Surgeon: Erroll Luna, MD;  Location: [place];  Service: General;  Laterality: N/A;  . UMBILICAL HERNIA REPAIR  11/03/2017  . UMBILICAL HERNIA REPAIR N/A 11/03/2017   Procedure: UMBILICAL HERNIA REPAIR WITH MESH;  Surgeon: Erroll Luna, MD;  Location: [place];  Service: General;  Laterality: N/A;     Current Outpatient Medications  Medication Sig Dispense Refill  . docusate sodium (COLACE) 100 MG capsule Take 1 capsule (100 mg total) by mouth every 12 (twelve) hours. 60 capsule 0  . hydrochlorothiazide (HYDRODIURIL) 25 MG tablet TAKE 1 TABLET BY MOUTH EVERY DAY 90 tablet 0  . UNABLE TO FIND CPAP: At bedtime     No current facility-administered medications for this visit.     Allergies:   Patient has no known allergies.    Social History:  The patient  reports that he quit smoking about 15 years ago. He has never used smokeless tobacco. He reports that he does not drink alcohol or use drugs.   Family History:  The patient's family history includes Aneurysm in his paternal grandmother; Diabetes in his brother, father, and sister.    ROS:  Please see the history of present illness.   Otherwise, review of systems are positive for none.   All other systems are reviewed and negative.    PHYSICAL EXAM: VS:  BP 136/89   Pulse 71   Ht 6\' 3"  (1.905 m)   Wt (!) 355 lb 9.6 oz (161.3 kg)  BMI 44.45 kg/m  , BMI Body mass index is 44.45 kg/m. GENERAL:  Well appearing HEENT:  Pupils equal round and reactive, fundi not visualized, oral mucosa unremarkable NECK:  No jugular venous distention, waveform within normal limits, carotid upstroke brisk and symmetric, no bruits, no thyromegaly LYMPHATICS:  No cervical adenopathy LUNGS:  Clear to auscultation bilaterally HEART:  RRR.  PMI not displaced or sustained,S1 and S2 within normal limits, no S3, no S4, no clicks, no rubs, no murmurs ABD:  Flat, positive bowel sounds normal in frequency in pitch, no bruits, no rebound, no guarding, no midline pulsatile mass, no hepatomegaly, no splenomegaly EXT:  2 plus pulses throughout, no edema, no cyanosis no clubbing SKIN:  No rashes no nodules NEURO:  Cranial nerves  II through XII grossly intact, motor grossly intact throughout PSYCH:  Cognitively intact, oriented to person place and time   EKG:  EKG is ordered today. The ekg ordered today demonstrates sinus rhythm.  Rate 71 bpm.  Inferolateral T wave inversions.   Recent Labs: 10/27/2017: ALT 39 11/03/2017: BUN 15; Creatinine, Ser 0.92; Hemoglobin 11.8; Platelets 173; Potassium 3.6; Sodium 144    Lipid Panel No results found for: CHOL, TRIG, HDL, CHOLHDL, VLDL, LDLCALC, LDLDIRECT    Wt Readings from Last 3 Encounters:  04/28/18 (!) 355 lb 9.6 oz (161.3 kg)  03/01/18 (!) 359 lb 6.4 oz (163 kg)  11/03/17 (!) 354 lb 8 oz (160.8 kg)      ASSESSMENT AND PLAN:  # PVCs: # Echo: Mr. Preziosi prior EKG showed frequent PVCs.  He has not actually noticed any palpitations.  He has no evidence of heart failure on exam.  However he does have a longstanding history of hypertension.  His EKG findings could be consistent with repolarization abnormalities due to his poorly controlled hypertension vs. Ischemia.  He is physically active and has no exertional symptoms concerning for ischemia.  We will get an echocardiogram to better assess.  We will also check a comprehensive metabolic panel, magnesium, TSH, free T4, and CBC.  We discussed limiting caffeine intake.  # Hypertension: BP slightly above goal.  It has been much better on HCTZ.  He would like to keep working on diet and exercise before adding any more meds.   He has already lost 4 lb.  His goal is <130/80.  If BP remains elevated at follow up, would add amlodipine.    Current medicines are reviewed at length with the patient today.  The patient does not have concerns regarding medicines.  The following changes have been made:  no change  Labs/ tests ordered today include:   Orders Placed This Encounter  Procedures  . CBC with Differential/Platelet  . T4, free  . TSH  . Lipid panel  . Magnesium  . Comprehensive metabolic panel  . EKG 12-Lead    . ECHOCARDIOGRAM COMPLETE     Disposition:   FU with Luis Farnell C. Oval Linsey, MD, [facility] in 3 months     Signed, Luis Staiger C. Oval Linsey, MD, [facility]  04/29/2018 5:22 PM    [facility]

## 2018-04-29 ENCOUNTER — Encounter: Payer: Self-pay | Admitting: Cardiovascular Disease

## 2018-04-29 DIAGNOSIS — I493 Ventricular premature depolarization: Secondary | ICD-10-CM

## 2018-04-29 HISTORY — DX: Ventricular premature depolarization: I49.3

## 2018-05-04 ENCOUNTER — Other Ambulatory Visit (HOSPITAL_COMMUNITY): Payer: Self-pay

## 2018-05-08 ENCOUNTER — Encounter: Payer: Self-pay | Admitting: Cardiovascular Disease

## 2018-07-03 ENCOUNTER — Other Ambulatory Visit: Payer: Self-pay | Admitting: Nurse Practitioner

## 2018-07-10 ENCOUNTER — Ambulatory Visit: Payer: Self-pay | Admitting: Nurse Practitioner

## 2018-07-13 ENCOUNTER — Telehealth: Payer: Self-pay

## 2018-07-13 NOTE — Telephone Encounter (Signed)
LVM for pt to call the office to reschedule his appt he missed 07/13/18

## 2018-07-25 ENCOUNTER — Telehealth: Payer: Self-pay

## 2018-07-25 NOTE — Telephone Encounter (Signed)
Patient called to reschedule his appt. YRL,RMA

## 2018-07-26 ENCOUNTER — Encounter: Payer: Self-pay | Admitting: Nurse Practitioner

## 2018-07-26 ENCOUNTER — Other Ambulatory Visit: Payer: Self-pay

## 2018-07-26 ENCOUNTER — Other Ambulatory Visit: Payer: Self-pay | Admitting: Nurse Practitioner

## 2018-07-26 ENCOUNTER — Ambulatory Visit: Payer: BLUE CROSS/BLUE SHIELD | Admitting: Nurse Practitioner

## 2018-07-26 VITALS — BP 136/88 | HR 89 | Temp 98.3°F | Ht 72.4 in | Wt 357.6 lb

## 2018-07-26 DIAGNOSIS — M62838 Other muscle spasm: Secondary | ICD-10-CM | POA: Diagnosis not present

## 2018-07-26 DIAGNOSIS — M545 Low back pain, unspecified: Secondary | ICD-10-CM | POA: Insufficient documentation

## 2018-07-26 DIAGNOSIS — I10 Essential (primary) hypertension: Secondary | ICD-10-CM

## 2018-07-26 DIAGNOSIS — I4949 Other premature depolarization: Secondary | ICD-10-CM

## 2018-07-26 NOTE — Patient Instructions (Signed)
Muscle Cramps and Spasms Muscle cramps and spasms are when muscles tighten by themselves. They usually get better within minutes. Muscle cramps are painful. They are usually stronger and last longer than muscle spasms. Muscle spasms may or may not be painful. They can last a few seconds or much longer. Cramps and spasms can affect any muscle, but they occur most often in the calf muscles of the leg. They are usually not caused by a serious problem. In many cases, the cause is not known. Some common causes include:  Doing more physical work or exercise than your body is ready for.  Using the muscles too much (overuse) by repeating certain movements too many times.  Staying in a certain position for a long time.  Playing a sport or doing an activity without preparing properly.  Using bad form or technique while playing a sport or doing an activity.  Not having enough water in your body (dehydration).  Injury.  Side effects of some medicines.  Low levels of the salts and minerals in your blood (electrolytes), such as low potassium or calcium. Follow these instructions at home: Managing pain and stiffness      Massage, stretch, and relax the muscle. Do this for many minutes at a time.  If told, put heat on tight or tense muscles as often as told by your doctor. Use the heat source that your doctor recommends, such as a moist heat pack or a heating pad. ? Place a towel between your skin and the heat source. ? Leave the heat on for 20-30 minutes. ? Remove the heat if your skin turns bright red. This is very important if you are not able to feel pain, heat, or cold. You may have a greater risk of getting burned.  If told, put ice on the affected area. This may help if you are sore or have pain after a cramp or spasm. ? Put ice in a plastic bag. ? Place a towel between your skin and the bag. ? Leave the ice on for 20 minutes, 2-3 times a day.  Try taking hot showers or baths to help  relax tight muscles. Eating and drinking  Drink enough fluid to keep your pee (urine) pale yellow.  Eat a healthy diet to help ensure that your muscles work well. This should include: ? Fruits and vegetables. ? Lean protein. ? Whole grains. ? Low-fat or nonfat dairy products. General instructions  If you are having cramps often, avoid intense exercise for several days.  Take over-the-counter and prescription medicines only as told by your doctor.  Watch for any changes in your symptoms.  Keep all follow-up visits as told by your doctor. This is important. Contact a doctor if:  Your cramps or spasms get worse or happen more often.  Your cramps or spasms do not get better with time. Summary  Muscle cramps and spasms are when muscles tighten by themselves. They usually get better within minutes.  Cramps and spasms occur most often in the calf muscles of the leg.  Massage, stretch, and relax the muscle. This may help the cramp or spasm go away.  Drink enough fluid to keep your pee (urine) pale yellow. This information is not intended to replace advice given to you by your health care provider. Make sure you discuss any questions you have with your health care provider. Document Released: 02/19/2008 Document Revised: 08/01/2017 Document Reviewed: 08/01/2017 Elsevier Interactive Patient Education  2019 [employer]  

## 2018-07-26 NOTE — Progress Notes (Signed)
Subjective:     Patient ID: Luis Lopez , male    DOB: 1975/07/18 , 43 y.o.   MRN: [id number]   Chief Complaint  Patient presents with  . Hypertension  . Back Pain    HPI  Hypertension  This is a chronic problem. The current episode started more than 1 year ago. The problem is controlled. Pertinent negatives include no anxiety, chest pain, headaches or palpitations. There are no associated agents to hypertension. Risk factors for coronary artery disease include obesity and sedentary lifestyle. Past treatments include diuretics. The current treatment provides mild improvement. There are no compliance problems.  There is no history of angina or kidney disease. There is no history of chronic renal disease.  Back Pain  This is a chronic problem. The current episode started more than 1 year ago. The problem occurs constantly. The pain is present in the lumbar spine. The pain does not radiate. The patient is experiencing no pain. The pain is worse during the day. Stiffness is present in the morning. Pertinent negatives include no abdominal pain, chest pain, fever or headaches. Risk factors include sedentary lifestyle and obesity. He has tried nothing for the symptoms. The treatment provided mild relief.     Past Medical History:  Diagnosis Date  . Hypertension    borderline  . PVC (premature ventricular contraction) 04/29/2018  . Sleep apnea    uses a cpap  . Snoring      Family History  Problem Relation Age of Onset  . Diabetes Father   . Diabetes Brother   . Diabetes Sister   . Aneurysm Paternal Grandmother        brain     Current Outpatient Medications:  .  aspirin EC 81 MG tablet, Take 81 mg by mouth daily., Disp: , Rfl:  .  hydrochlorothiazide (HYDRODIURIL) 25 MG tablet, TAKE 1 TABLET BY MOUTH EVERY DAY, Disp: 30 tablet, Rfl: 0 .  UNABLE TO FIND, CPAP: At bedtime, Disp: , Rfl:    No Known Allergies   Review of Systems  Constitutional: Negative for fatigue and fever.   Respiratory: Negative.   Cardiovascular: Negative.  Negative for chest pain, palpitations and leg swelling.  Gastrointestinal: Negative for abdominal pain.  Musculoskeletal: Positive for back pain. Negative for arthralgias and myalgias.  Skin: Negative.   Neurological: Negative for dizziness and headaches.     Today's Vitals   07/26/18 1036  BP: 136/88  Pulse: 89  Temp: 98.3 F (36.8 C)  TempSrc: Oral  Weight: (!) 357 lb 9.6 oz (162.2 kg)  Height: 6' 0.4" (1.839 m)  PainSc: 6   PainLoc: Back   Body mass index is 47.96 kg/m.   Objective:  Physical Exam Constitutional:      Appearance: Normal appearance. He is well-developed.  Cardiovascular:     Rate and Rhythm: Normal rate. Rhythm regularly irregular.     Pulses: Normal pulses.     Heart sounds: S1 normal and S2 normal. No murmur.  Pulmonary:     Effort: Pulmonary effort is normal.     Breath sounds: Normal breath sounds. No wheezing.  Chest:     Chest wall: No tenderness.  Musculoskeletal:        General: Tenderness (left lower back, negative straight leg rais) present.  Skin:    General: Skin is warm and dry.  Neurological:     Mental Status: He is alert and oriented to person, place, and time.         Assessment  And Plan:     1. Essential hypertension . B/P is controlled.  Marland Kitchen BMP ordered to check renal function.  . The importance of regular exercise and dietary modification was stressed to the patient.  . Stressed importance of losing ten percent of her body weight to help with B/P control.  - BMP8+eGFR; Future - TSH - Magnesium - CBC with Diff  2. Muscle spasm  Low back tension after working he thinks  Unable to sleep well at night  Will treat with muscle relaxer and encouraged to stretch - Lipid Profile  3. Acute bilateral low back pain without sciatica  See #2  Encouraged on continued weight loss  4. Premature beats  Is been followed by Dr. Molli Posey, FNP    THE  PATIENT IS ENCOURAGED TO PRACTICE SOCIAL DISTANCING DUE TO THE COVID-19 PANDEMIC.

## 2018-07-27 ENCOUNTER — Telehealth: Payer: Self-pay

## 2018-07-27 ENCOUNTER — Telehealth: Payer: Self-pay | Admitting: Cardiovascular Disease

## 2018-07-27 LAB — CBC WITH DIFFERENTIAL/PLATELET
Basophils Absolute: 0 10*3/uL (ref 0.0–0.2)
Basos: 1 %
EOS (ABSOLUTE): 0.1 10*3/uL (ref 0.0–0.4)
Eos: 1 %
Hematocrit: 39.8 % (ref 37.5–51.0)
Hemoglobin: 13.3 g/dL (ref 13.0–17.7)
Immature Grans (Abs): 0 10*3/uL (ref 0.0–0.1)
Immature Granulocytes: 0 %
Lymphocytes Absolute: 1.5 10*3/uL (ref 0.7–3.1)
Lymphs: 20 %
MCH: 26.7 pg (ref 26.6–33.0)
MCHC: 33.4 g/dL (ref 31.5–35.7)
MCV: 80 fL (ref 79–97)
Monocytes Absolute: 0.4 10*3/uL (ref 0.1–0.9)
Monocytes: 5 %
Neutrophils Absolute: 5.7 10*3/uL (ref 1.4–7.0)
Neutrophils: 73 %
Platelets: 190 10*3/uL (ref 150–450)
RBC: 4.98 x10E6/uL (ref 4.14–5.80)
RDW: 12.4 % (ref 11.6–15.4)
WBC: 7.8 10*3/uL (ref 3.4–10.8)

## 2018-07-27 LAB — LIPID PANEL
Chol/HDL Ratio: 4.2 ratio (ref 0.0–5.0)
Cholesterol, Total: 152 mg/dL (ref 100–199)
HDL: 36 mg/dL — ABNORMAL LOW (ref >39)
LDL Calculated: 100 mg/dL — ABNORMAL HIGH (ref 0–99)
Triglycerides: 82 mg/dL (ref 0–149)
VLDL Cholesterol Cal: 16 mg/dL (ref 5–40)

## 2018-07-27 LAB — BMP8+EGFR
BUN/Creatinine Ratio: 15 (ref 9–20)
BUN: 13 mg/dL (ref 6–24)
CO2: 25 mmol/L (ref 20–29)
Calcium: 8.9 mg/dL (ref 8.7–10.2)
Chloride: 99 mmol/L (ref 96–106)
Creatinine, Ser: 0.85 mg/dL (ref 0.76–1.27)
GFR calc Af Amer: 125 mL/min/{1.73_m2} (ref >59)
GFR calc non Af Amer: 108 mL/min/{1.73_m2} (ref >59)
Glucose: 112 mg/dL — ABNORMAL HIGH (ref 65–99)
Potassium: 3.7 mmol/L (ref 3.5–5.2)
Sodium: 140 mmol/L (ref 134–144)

## 2018-07-27 LAB — MAGNESIUM: Magnesium: 1.8 mg/dL (ref 1.6–2.3)

## 2018-07-27 LAB — TSH: TSH: 1.56 u[IU]/mL (ref 0.450–4.500)

## 2018-07-27 NOTE — Telephone Encounter (Signed)
New Message   Patient appt has been changed to virtual visit and he does have a smart phone and computer please call him to setup for virtual visit.

## 2018-07-27 NOTE — Telephone Encounter (Signed)
LVM TO RETURN CALL TO MAKE APPT VIRTUAL

## 2018-08-01 ENCOUNTER — Telehealth: Payer: Self-pay | Admitting: Cardiovascular Disease

## 2018-08-01 NOTE — Telephone Encounter (Signed)
Smartphone/ consent given/ my chart active/ pre reg completed °

## 2018-08-01 NOTE — Progress Notes (Signed)
Virtual Visit via Video Note   This visit type was conducted due to national recommendations for restrictions regarding the COVID-19 Pandemic (e.g. social distancing) in an effort to limit this patient's exposure and mitigate transmission in our community.  Due to his co-morbid illnesses, this patient is at least at moderate risk for complications without adequate follow up.  This format is felt to be most appropriate for this patient at this time.  All issues noted in this document were discussed and addressed.  A limited physical exam was performed with this format.  Please refer to the patient's chart for his consent to telehealth for [facility].   Date:  08/02/2018   ID:  Genice Rouge, DOB 03-19-1976, MRN [id number]  Patient Location: Home Provider Location: Office  PCP:  [name], FNP  Cardiologist:  No primary care provider on file.  Electrophysiologist:  None   Evaluation Performed:  Follow-Up Visit  Chief Complaint:  PVCs  History of Present Illness:    Neev Mcmains is a 43 y.o. male with hypertension, PVCs, and OSA on CPAP here for follow up.  He was first seen 04/2018 for the evaluation of PVCs.  Mr.Lintner saw [name], FNP 02/2018 and reported palpitations.  He had previously been seen in another provider's office where his heart was noted to be irregular.  An EKG was obtained and he was found to have 3 PVCs on top of a baseline of sinus rhythm.  He was asymptomatic at the time.  There was some concern that he may have also had a prior MI.  Mr. Haile was referred for an echo that has not yet been completed.  Lab testing was unremarkable.  His BP was poorly controlled and he wanted to work on diet and exercise.   Since his last appointment Mr. Mcfayden has been doing well.  He is feeling much better and has no chest pain or shortness of breath. He has no palpitations, lightheadedness or dizziness.  His only complaint is back pain.  His BP at home has been in the low 130s at  most.  Diastolic is in the 25O.  He notes that he has more energy when he wakes up in the morning and feels well.  He has been exercising regularly by playing basketball and walking.  His wife also bought an elliptical so that he can exercise when it is raining outside.  He is limiting his portions and increasing his vegetables.  He uses his CPAP regularly.  The patient does not have symptoms concerning for COVID-19 infection (fever, chills, cough, or new shortness of breath).    Past Medical History:  Diagnosis Date  . Hypertension    borderline  . PVC (premature ventricular contraction) 04/29/2018  . Sleep apnea    uses a cpap  . Snoring    Past Surgical History:  Procedure Laterality Date  . INSERTION OF MESH N/A 11/03/2017   Procedure: INSERTION OF MESH;  Surgeon: Erroll Luna, MD;  Location: [place];  Service: General;  Laterality: N/A;  . UMBILICAL HERNIA REPAIR  11/03/2017  . UMBILICAL HERNIA REPAIR N/A 11/03/2017   Procedure: UMBILICAL HERNIA REPAIR WITH MESH;  Surgeon: Erroll Luna, MD;  Location: [place];  Service: General;  Laterality: N/A;     Current Meds  Medication Sig  . aspirin EC 81 MG tablet Take 81 mg by mouth daily.  . hydrochlorothiazide (HYDRODIURIL) 25 MG tablet TAKE 1 TABLET BY MOUTH EVERY DAY  . UNABLE TO FIND CPAP:  At bedtime     Allergies:   Patient has no known allergies.   Social History   Tobacco Use  . Smoking status: Former Smoker    Last attempt to quit: 03/23/2003    Years since quitting: 15.3  . Smokeless tobacco: Never Used  Substance Use Topics  . Alcohol use: No  . Drug use: No     Family Hx: The patient's family history includes Aneurysm in his paternal grandmother; Diabetes in his brother, father, and sister.  ROS:   Please see the history of present illness.     All other systems reviewed and are negative.   Prior CV studies:   The following studies were reviewed today:    Labs/Other Tests and Data Reviewed:    EKG:   An ECG dated 04/28/2018 was personally reviewed today and demonstrated:  sinus rhythm.  Rate 71 bpm.  Inferolateral TWI   Recent Labs: 10/27/2017: ALT 39 07/26/2018: BUN 13; Creatinine, Ser 0.85; Hemoglobin 13.3; Magnesium 1.8; Platelets 190; Potassium 3.7; Sodium 140; TSH 1.560   Recent Lipid Panel Lab Results  Component Value Date/Time   CHOL 152 07/26/2018 04:06 PM   TRIG 82 07/26/2018 04:06 PM   HDL 36 (L) 07/26/2018 04:06 PM   CHOLHDL 4.2 07/26/2018 04:06 PM   LDLCALC 100 (H) 07/26/2018 04:06 PM    Wt Readings from Last 3 Encounters:  08/02/18 (!) 351 lb (159.2 kg)  07/26/18 (!) 357 lb 9.6 oz (162.2 kg)  04/28/18 (!) 355 lb 9.6 oz (161.3 kg)     Objective:    BP 133/82   Ht 6\' 3"  (1.905 m)   Wt (!) 351 lb (159.2 kg)   BMI 43.87 kg/m  GENERAL: Well-appearing.  No acute distress. HEENT: Pupils equal round.  Oral mucosa unremarkable NECK:  No jugular venous distention, no visible thyromegaly EXT:  No edema, no cyanosis no clubbing SKIN:  No rashes no nodules NEURO:  Speech fluent.  Cranial nerves grossly intact.  Moves all 4 extremities freely PSYCH:  Cognitively intact, oriented to person place and time   ASSESSMENT & PLAN:    # PVCs: # Echo: Mr. Pienta prior EKG showed frequent PVCs.  He is not able to feel them.  He currently denies heart failure symptoms.  There is a question of prior MI and he was referred for an echocardiogram that has not yet been performed.  We will have him get this echo to make sure there is no evidence of underlying structural heart disease.  Has not actually noticed any palpitations.  Labs were unremarkable.  He has no evidence of heart failure on exam.  However he does have a longstanding history of hypertension.    # Hypertension: BP is improving.  He wants to keep working on diet and exercise.  Goal is <130/80.  Continue HCTZ.  # OSA: Continue CPAP.  COVID-19 Education: The signs and symptoms of COVID-19 were discussed with the patient  and how to seek care for testing (follow up with PCP or arrange E-visit).  The importance of social distancing was discussed today.  Time:   Today, I have spent 15 minutes with the patient with telehealth technology discussing the above problems.     Medication Adjustments/Labs and Tests Ordered: Current medicines are reviewed at length with the patient today.  Concerns regarding medicines are outlined above.   Tests Ordered: No orders of the defined types were placed in this encounter.   Medication Changes: No orders of the defined types  were placed in this encounter.   Disposition:  Follow up prn  Signed, Skeet Latch, MD  08/02/2018 10:08 AM    [place]

## 2018-08-02 ENCOUNTER — Encounter: Payer: Self-pay | Admitting: Cardiovascular Disease

## 2018-08-02 ENCOUNTER — Telehealth (INDEPENDENT_AMBULATORY_CARE_PROVIDER_SITE_OTHER): Payer: BLUE CROSS/BLUE SHIELD | Admitting: Cardiovascular Disease

## 2018-08-02 ENCOUNTER — Other Ambulatory Visit: Payer: Self-pay | Admitting: Nurse Practitioner

## 2018-08-02 DIAGNOSIS — R9431 Abnormal electrocardiogram [ECG] [EKG]: Secondary | ICD-10-CM | POA: Diagnosis not present

## 2018-08-02 DIAGNOSIS — G4710 Hypersomnia, unspecified: Secondary | ICD-10-CM | POA: Diagnosis not present

## 2018-08-02 DIAGNOSIS — G4730 Sleep apnea, unspecified: Secondary | ICD-10-CM

## 2018-08-02 DIAGNOSIS — I10 Essential (primary) hypertension: Secondary | ICD-10-CM | POA: Diagnosis not present

## 2018-08-02 DIAGNOSIS — M62838 Other muscle spasm: Secondary | ICD-10-CM

## 2018-08-02 DIAGNOSIS — I493 Ventricular premature depolarization: Secondary | ICD-10-CM

## 2018-08-02 MED ORDER — CYCLOBENZAPRINE HCL 10 MG PO TABS: 10.0000 mg | ORAL_TABLET | Freq: Three times a day (TID) | ORAL | 0 refills | Status: DC | PRN

## 2018-08-02 NOTE — Addendum Note (Signed)
Addended by: Alvina Filbert B on: 08/02/2018 01:23 PM   Modules accepted: Orders

## 2018-08-02 NOTE — Patient Instructions (Addendum)
Medication Instructions:  Your physician recommends that you continue on your current medications as directed. Please refer to the Current Medication list given to you today.  If you need a refill on your cardiac medications before your next appointment, please call your pharmacy.   Lab work: NONE  Testing/Procedures: Your physician has requested that you have an echocardiogram. Echocardiography is a painless test that uses sound waves to create images of your heart. It provides your doctor with information about the size and shape of your heart and how well your heart's chambers and valves are working. This procedure takes approximately one hour. There are no restrictions for this procedure. [facility] [place]  OFFICE WILL CALL YOU WITH DATE AND TIME  Follow-Up: IF ECHO LOOKS OK FOLLOW UP AS NEEDED  Any Other Special Instructions Will Be Listed Below (If Applicable). KEEP UP THE DIET AND EXERCISE TRYING TO EXERCISE AT LEAST [place]

## 2018-08-08 ENCOUNTER — Telehealth (HOSPITAL_COMMUNITY): Payer: Self-pay | Admitting: *Deleted

## 2018-08-08 NOTE — Telephone Encounter (Signed)

## 2018-08-10 ENCOUNTER — Other Ambulatory Visit (HOSPITAL_COMMUNITY): Payer: BLUE CROSS/BLUE SHIELD

## 2018-08-14 ENCOUNTER — Encounter: Payer: Self-pay | Admitting: Nurse Practitioner

## 2018-08-20 ENCOUNTER — Other Ambulatory Visit: Payer: Self-pay | Admitting: Nurse Practitioner

## 2018-10-26 ENCOUNTER — Ambulatory Visit: Payer: BLUE CROSS/BLUE SHIELD | Admitting: Nurse Practitioner

## 2018-11-02 ENCOUNTER — Ambulatory Visit: Payer: BC Managed Care – PPO | Admitting: Nurse Practitioner

## 2018-11-07 ENCOUNTER — Ambulatory Visit: Payer: Self-pay | Admitting: Nurse Practitioner

## 2018-11-23 ENCOUNTER — Other Ambulatory Visit: Payer: Self-pay | Admitting: Nurse Practitioner

## 2018-11-30 ENCOUNTER — Telehealth (HOSPITAL_COMMUNITY): Payer: Self-pay

## 2018-11-30 NOTE — Telephone Encounter (Signed)
FYI

## 2018-11-30 NOTE — Telephone Encounter (Signed)
New message    Just an FYI. We have made several attempts to contact this patient including sending a letter to schedule or reschedule their echocardiogram. We will be removing the patient from the echo WQ.  8.24.20 @ 2:21pm lm on home vm Karo Rog  7.24.20 @ 2:43pm lm on home vm - Edris Schneck  5/21  No show 2.13.20 no show

## 2018-12-18 ENCOUNTER — Other Ambulatory Visit: Payer: Self-pay | Admitting: Nurse Practitioner

## 2018-12-25 ENCOUNTER — Encounter: Payer: Self-pay | Admitting: Nurse Practitioner

## 2018-12-25 ENCOUNTER — Ambulatory Visit: Payer: BC Managed Care – PPO | Admitting: Nurse Practitioner

## 2018-12-25 ENCOUNTER — Other Ambulatory Visit: Payer: Self-pay

## 2018-12-25 VITALS — BP 134/92 | HR 96 | Temp 98.1°F | Ht 73.4 in | Wt 360.6 lb

## 2018-12-25 DIAGNOSIS — I10 Essential (primary) hypertension: Secondary | ICD-10-CM | POA: Diagnosis not present

## 2018-12-25 DIAGNOSIS — Z23 Encounter for immunization: Secondary | ICD-10-CM | POA: Diagnosis not present

## 2018-12-25 MED ORDER — HYDROCHLOROTHIAZIDE 25 MG PO TABS: 25.0000 mg | ORAL_TABLET | Freq: Every day | ORAL | 1 refills | Status: DC

## 2018-12-25 NOTE — Progress Notes (Signed)
Subjective:     Patient ID: Luis Lopez , male    DOB: April 04, 1975 , 43 y.o.   MRN: [id number]   Chief Complaint  Patient presents with  . Hypertension    HPI  Wt Readings from Last 3 Encounters: 12/25/18 : (!) 360 lb 9.6 oz (163.6 kg) 08/02/18 : (!) 351 lb (159.2 kg) 07/26/18 : (!) 357 lb 9.6 oz (162.2 kg)  He weighed with his shoes and full pockets.     Hypertension This is a chronic problem. The current episode started more than 1 year ago (he has been without his blood pressure medications for 3 days and blood pressure increased to 149/90's). The problem has been gradually worsening since onset. The problem is uncontrolled. Pertinent negatives include no anxiety, blurred vision, chest pain, headaches or palpitations. Risk factors for coronary artery disease include obesity and male gender. Past treatments include diuretics (exercising every day on elliptical). There is no history of angina or kidney disease.     Past Medical History:  Diagnosis Date  . Hypertension    borderline  . PVC (premature ventricular contraction) 04/29/2018  . Sleep apnea    uses a cpap  . Snoring      Family History  Problem Relation Age of Onset  . Diabetes Father   . Diabetes Brother   . Diabetes Sister   . Aneurysm Paternal Grandmother        brain     Current Outpatient Medications:  .  aspirin EC 81 MG tablet, Take 81 mg by mouth daily., Disp: , Rfl:  .  hydrochlorothiazide (HYDRODIURIL) 25 MG tablet, Take 1 tablet (25 mg total) by mouth daily. PATIENT NEEDS AN APPOINTMENT, Disp: 30 tablet, Rfl: 0 .  UNABLE TO FIND, CPAP: At bedtime, Disp: , Rfl:    No Known Allergies   Review of Systems  Constitutional: Negative.   Eyes: Negative for blurred vision.  Respiratory: Negative.   Cardiovascular: Negative.  Negative for chest pain, palpitations and leg swelling.  Skin: Negative.   Neurological: Negative.  Negative for dizziness and headaches.  Psychiatric/Behavioral: Negative.       Today's Vitals   12/25/18 1438  BP: (!) 134/92  Pulse: 96  Temp: 98.1 F (36.7 C)  TempSrc: Oral  Weight: (!) 360 lb 9.6 oz (163.6 kg)  Height: 6' 1.4" (1.864 m)  PainSc: 0-No pain   Body mass index is 47.06 kg/m.   Objective:  Physical Exam Vitals signs reviewed.  Constitutional:      General: He is not in acute distress.    Appearance: Normal appearance. He is obese.  Cardiovascular:     Rate and Rhythm: Normal rate and regular rhythm.     Pulses: Normal pulses.     Heart sounds: Normal heart sounds.  Pulmonary:     Effort: Pulmonary effort is normal.     Breath sounds: Normal breath sounds.  Skin:    General: Skin is warm and dry.     Capillary Refill: Capillary refill takes less than 2 seconds.  Neurological:     General: No focal deficit present.     Mental Status: He is alert and oriented to person, place, and time.         Assessment And Plan:      1. Essential hypertension B/P is controlled.  CMP ordered to check renal function.  The importance of regular exercise and dietary modification was stressed to the patient.  Stressed importance of losing ten percent of her  body weight to help with B/P control.  - hydrochlorothiazide (HYDRODIURIL) 25 MG tablet; Take 1 tablet (25 mg total) by mouth daily. PATIENT NEEDS AN APPOINTMENT  Dispense: 90 tablet; Refill: 1 - Lipid panel - BMP8+eGFR  2. Morbid obesity due to excess calories (HCC)  Chronic, encouraged to continue exercising regularly  He reports his weight is up due to the shoes and what he has in his pockets. - Hemoglobin A1c  3. Need for influenza vaccination  Influenza vaccine given in office  Advised to take Tylenol as needed for muscle aches or fever - Flu Vaccine QUAD 6+ mos PF IM (Fluarix Quad PF)   [name], FNP    THE PATIENT IS ENCOURAGED TO PRACTICE SOCIAL DISTANCING DUE TO THE COVID-19 PANDEMIC.

## 2018-12-28 ENCOUNTER — Encounter: Payer: Self-pay | Admitting: Nurse Practitioner

## 2019-01-01 ENCOUNTER — Other Ambulatory Visit: Payer: Self-pay

## 2019-01-01 ENCOUNTER — Ambulatory Visit (HOSPITAL_COMMUNITY)
Admission: EM | Admit: 2019-01-01 | Discharge: 2019-01-01 | Disposition: A | Discharge status: Home / Self Care | Payer: BC Managed Care – PPO | Attending: Family Medicine | Admitting: Family Medicine

## 2019-01-01 ENCOUNTER — Encounter (HOSPITAL_COMMUNITY): Payer: Self-pay

## 2019-01-01 DIAGNOSIS — M5442 Lumbago with sciatica, left side: Secondary | ICD-10-CM

## 2019-01-01 DIAGNOSIS — M5441 Lumbago with sciatica, right side: Secondary | ICD-10-CM | POA: Diagnosis not present

## 2019-01-01 MED ORDER — KETOROLAC TROMETHAMINE 30 MG/ML IJ SOLN
30.0000 mg | Freq: Once | INTRAMUSCULAR | Status: AC
  Administered 2019-01-01: 30 mg via INTRAMUSCULAR

## 2019-01-01 MED ORDER — KETOROLAC TROMETHAMINE 30 MG/ML IJ SOLN
INTRAMUSCULAR | Status: AC
  Filled 2019-01-01: qty 1

## 2019-01-01 MED ORDER — MELOXICAM 7.5 MG PO TABS: 7.5000 mg | ORAL_TABLET | Freq: Every day | ORAL | 0 refills | Status: DC

## 2019-01-01 MED ORDER — PREDNISONE 10 MG (21) PO TBPK: ORAL_TABLET | Freq: Every day | ORAL | 0 refills | Status: DC

## 2019-01-01 MED ORDER — ACETAMINOPHEN 500 MG PO TABS: 500.0000 mg | ORAL_TABLET | Freq: Four times a day (QID) | ORAL | 0 refills | Status: DC | PRN

## 2019-01-01 MED ORDER — CYCLOBENZAPRINE HCL 5 MG PO TABS: 5.0000 mg | ORAL_TABLET | Freq: Every day | ORAL | 0 refills | Status: DC

## 2019-01-01 NOTE — ED Triage Notes (Signed)
Pt presents to UC w/ c/o lower back pain x3 days. Pt states pain shoots to hips.

## 2019-01-01 NOTE — Discharge Instructions (Signed)
Light and regular activity as tolerated.  See exercises provided.  Heat application while active can help with muscle spasms.  Sleep with pillow under your knees.   Start prednisone pack today. May take tylenol as needed as well. Once prednisone is complete you may take meloxicam daily. Don't take additional ibuprofen for aleve. Take with food.  Flexeril at night as a muscle relaxer. May cause drowsiness. Please do not take if driving or drinking alcohol.   Please follow up with your primary care provider for recheck in the next few weeks as may need further evaluation, treatment or referral if no improvement.

## 2019-01-01 NOTE — ED Provider Notes (Signed)
[place]    CSN: [id number] Arrival date & time: 01/01/19  1238      History   Chief Complaint Chief Complaint  Patient presents with   Back Pain    HPI Luis Lopez is a 43 y.o. male.   Luis Lopez presents with complaints of midline and bilateral low back pain, radiates to bilateral hips and thighs. No known injury. Woke with it three days ago, today felt worse when he woke. 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 No numbness tingling or weakness to the legs or feet. No saddle symptoms. No loss of bladder or bowel. Bending or leaning forward, as well as hip flexion and laying flat with straight legs all worsen the pain. Improves with standing or sitting upright. Hasn't taken any medications for pain. States has had similar once before but was not evaluated for it at that time. No previous back surgery. No dm. History  Of htn.    ROS per HPI, negative if not otherwise mentioned.      Past Medical History:  Diagnosis Date   Hypertension    borderline   PVC (premature ventricular contraction) 04/29/2018   Sleep apnea    uses a cpap   Snoring     Patient Active Problem List   Diagnosis Date Noted   Muscle spasm 07/26/2018   Acute bilateral low back pain without sciatica 07/26/2018   PVC (premature ventricular contraction) 04/29/2018   Abnormal EKG 03/01/2018   Essential hypertension [id number]   Umbilical hernia [id number]   Morbid obesity due to excess calories ([place]) 11/06/2015   Nocturia more than twice per night 11/06/2015   Sleep deprivation 11/06/2015   Hypersomnia with sleep apnea 11/06/2015   OSA (obstructive sleep apnea) 11/06/2015    Past Surgical History:  Procedure Laterality Date   INSERTION OF MESH N/A 11/03/2017   Procedure: INSERTION OF MESH;  Surgeon: Erroll Luna, MD;  Location: [facility];  Service: General;  Laterality: N/A;   UMBILICAL HERNIA REPAIR  [id number]   UMBILICAL HERNIA REPAIR N/A  11/03/2017   Procedure: UMBILICAL HERNIA REPAIR WITH MESH;  Surgeon: Erroll Luna, MD;  Location: [facility];  Service: General;  Laterality: N/A;       Home Medications    Prior to Admission medications   Medication Sig Start Date End Date Taking? Authorizing Provider  acetaminophen (TYLENOL) 500 MG tablet Take 1 tablet (500 mg total) by mouth every 6 (six) hours as needed. 01/01/19   Zigmund Gottron, NP  aspirin EC 81 MG tablet Take 81 mg by mouth daily.    [provider]  cyclobenzaprine (FLEXERIL) 5 MG tablet Take 1 tablet (5 mg total) by mouth at bedtime. 01/01/19   Zigmund Gottron, NP  hydrochlorothiazide (HYDRODIURIL) 25 MG tablet Take 1 tablet (25 mg total) by mouth daily. PATIENT NEEDS AN APPOINTMENT 12/25/18   [name], FNP  meloxicam (MOBIC) 7.5 MG tablet Take 1 tablet (7.5 mg total) by mouth daily. May start once prednisone completed 01/01/19   Zigmund Gottron, NP  predniSONE (STERAPRED UNI-PAK 21 TAB) 10 MG (21) TBPK tablet Take by mouth daily. Per box instruction 01/01/19   Zigmund Gottron, NP  UNABLE TO FIND CPAP: At bedtime    [provider]    Family History Family History  Problem Relation Age of Onset   Diabetes Father    Diabetes Brother    Diabetes Sister    Aneurysm Paternal Grandmother  brain    Social History Social History   Tobacco Use   Smoking status: Former Smoker    Quit date: 03/23/2003    Years since quitting: 15.7   Smokeless tobacco: Never Used  Substance Use Topics   Alcohol use: No   Drug use: No     Allergies   Patient has no known allergies.   Review of Systems Review of Systems   Physical Exam Triage Vital Signs ED Triage Vitals  Enc Vitals Group     BP 01/01/19 1320 (!) 152/92     Pulse Rate 01/01/19 1320 80     Resp 01/01/19 1320 16     Temp 01/01/19 1320 98.2 F (36.8 C)     Temp Source 01/01/19 1320 Oral     SpO2 01/01/19 1320 95 %     Weight --      Height --      Head  Circumference --      Peak Flow --      Pain Score 01/01/19 1324 7     Pain Loc --      Pain Edu? --      Excl. in [place]? --    No data found.  Updated Vital Signs BP (!) 152/92 (BP Location: Left Arm)    Pulse 80    Temp 98.2 F (36.8 C) (Oral)    Resp 16    SpO2 95%   Visual Acuity Right Eye Distance:   Left Eye Distance:   Bilateral Distance:    Right Eye Near:   Left Eye Near:    Bilateral Near:     Physical Exam Constitutional:      Appearance: He is well-developed.  Cardiovascular:     Rate and Rhythm: Normal rate.  Pulmonary:     Effort: Pulmonary effort is normal.  Musculoskeletal:     Lumbar back: He exhibits tenderness, bony tenderness and pain. He exhibits normal range of motion, no swelling, no edema, no deformity, no laceration, no spasm and normal pulse.     Comments: Generalized low back tenderness; no step off or deformity to spinous processes; pain with bilateral hip flexion and straight leg raise but no radiation of pain; strength equal bilaterally; gross sensation intact to lower extremities; ambulatory without difficulty; pain with transition from sit to lay and lay to sit   Skin:    General: Skin is warm and dry.  Neurological:     Mental Status: He is alert and oriented to person, place, and time.      UC Treatments / Results  Labs (all labs ordered are listed, but only abnormal results are displayed) Labs Reviewed - No data to display  EKG   Radiology No results found.  Procedures Procedures (including critical care time)  Medications Ordered in UC Medications  ketorolac (TORADOL) 30 MG/ML injection 30 mg (30 mg Intramuscular Given 01/01/19 1406)  ketorolac (TORADOL) 30 MG/ML injection (has no administration in time range)    Initial Impression / Assessment and Plan / UC Course  I have reviewed the triage vital signs and the nursing notes.  Pertinent labs & imaging results that were available during my care of the patient were  reviewed by me and considered in my medical decision making (see chart for details).     Acute low back pain without red flag findings. Prednisone pack and flexeril provided, to be followed with meloxicam as needed. Encouraged close follow up with PCP for recheck and management. Patient verbalized  understanding and agreeable to plan.  Ambulatory out of clinic without difficulty.    Final Clinical Impressions(s) / UC Diagnoses   Final diagnoses:  Acute bilateral low back pain with bilateral sciatica     Discharge Instructions     Light and regular activity as tolerated.  See exercises provided.  Heat application while active can help with muscle spasms.  Sleep with pillow under your knees.   Start prednisone pack today. May take tylenol as needed as well. Once prednisone is complete you may take meloxicam daily. Don't take additional ibuprofen for aleve. Take with food.  Flexeril at night as a muscle relaxer. May cause drowsiness. Please do not take if driving or drinking alcohol.   Please follow up with your primary care provider for recheck in the next few weeks as may need further evaluation, treatment or referral if no improvement.    ED Prescriptions    Medication Sig Dispense Auth. Provider   predniSONE (STERAPRED UNI-PAK 21 TAB) 10 MG (21) TBPK tablet Take by mouth daily. Per box instruction 21 tablet Jabree Pernice, Lanelle Bal B, NP   meloxicam (MOBIC) 7.5 MG tablet Take 1 tablet (7.5 mg total) by mouth daily. May start once prednisone completed 20 tablet Augusto Gamble B, NP   acetaminophen (TYLENOL) 500 MG tablet Take 1 tablet (500 mg total) by mouth every 6 (six) hours as needed. 30 tablet Augusto Gamble B, NP   cyclobenzaprine (FLEXERIL) 5 MG tablet Take 1 tablet (5 mg total) by mouth at bedtime. 15 tablet Zigmund Gottron, NP     PDMP not reviewed this encounter.   Zigmund Gottron, NP 01/01/19 1436

## 2019-02-05 ENCOUNTER — Ambulatory Visit (HOSPITAL_COMMUNITY)
Admission: EM | Admit: 2019-02-05 | Discharge: 2019-02-05 | Disposition: A | Discharge status: Home / Self Care | Payer: BC Managed Care – PPO | Attending: Internal Medicine | Admitting: Internal Medicine

## 2019-02-05 ENCOUNTER — Other Ambulatory Visit: Payer: Self-pay

## 2019-02-05 ENCOUNTER — Encounter (HOSPITAL_COMMUNITY): Payer: Self-pay

## 2019-02-05 DIAGNOSIS — I10 Essential (primary) hypertension: Secondary | ICD-10-CM | POA: Diagnosis not present

## 2019-02-05 DIAGNOSIS — M5432 Sciatica, left side: Secondary | ICD-10-CM | POA: Diagnosis not present

## 2019-02-05 MED ORDER — KETOROLAC TROMETHAMINE 60 MG/2ML IM SOLN
60.0000 mg | Freq: Once | INTRAMUSCULAR | Status: AC
  Administered 2019-02-05: 60 mg via INTRAMUSCULAR

## 2019-02-05 MED ORDER — IBUPROFEN 800 MG PO TABS: 800.0000 mg | ORAL_TABLET | Freq: Three times a day (TID) | ORAL | 0 refills | Status: DC | PRN

## 2019-02-05 MED ORDER — DEXAMETHASONE SODIUM PHOSPHATE 10 MG/ML IJ SOLN
10.0000 mg | Freq: Once | INTRAMUSCULAR | Status: AC
  Administered 2019-02-05: 10 mg via INTRAMUSCULAR

## 2019-02-05 MED ORDER — CYCLOBENZAPRINE HCL 10 MG PO TABS: 10.0000 mg | ORAL_TABLET | Freq: Two times a day (BID) | ORAL | 0 refills | Status: DC | PRN

## 2019-02-05 MED ORDER — PREDNISONE 20 MG PO TABS: 20.0000 mg | ORAL_TABLET | Freq: Every day | ORAL | 0 refills | Status: AC

## 2019-02-05 MED ORDER — DEXAMETHASONE SODIUM PHOSPHATE 10 MG/ML IJ SOLN
INTRAMUSCULAR | Status: AC
  Filled 2019-02-05: qty 1

## 2019-02-05 MED ORDER — KETOROLAC TROMETHAMINE 60 MG/2ML IM SOLN
INTRAMUSCULAR | Status: AC
  Filled 2019-02-05: qty 2

## 2019-02-05 NOTE — ED Triage Notes (Signed)
Pt presents with left lower back pain that radiates down into left hip and down left leg X 1 month .

## 2019-02-05 NOTE — ED Provider Notes (Signed)
[place]    CSN: [id number] Arrival date & time: 02/05/19  1005      History   Chief Complaint Chief Complaint  Patient presents with  . Back Pain  . Leg Pain  . Hip Pain    HPI Luis Lopez is a 43 y.o. male with a history of hypertension-suboptimally controlled comes to urgent care  with complaint of lower back pain with radiation into the left leg.  Patient symptoms started last month and it worsened over the past few days.  Pain is severe-10/10, sharp, aggravated by movement.  No known relieving factors.  Patient denies any numbness, tingling or weakness in the left leg.  No relieving factors with lying flat, raising legs or squatting.  HPI  Past Medical History:  Diagnosis Date  . Hypertension    borderline  . PVC (premature ventricular contraction) 04/29/2018  . Sleep apnea    uses a cpap  . Snoring     Patient Active Problem List   Diagnosis Date Noted  . Muscle spasm 07/26/2018  . Acute bilateral low back pain without sciatica 07/26/2018  . PVC (premature ventricular contraction) 04/29/2018  . Abnormal EKG 03/01/2018  . Essential hypertension 03/01/2018  . Umbilical hernia [id number]  . Morbid obesity due to excess calories ([place]) 11/06/2015  . Nocturia more than twice per night 11/06/2015  . Sleep deprivation 11/06/2015  . Hypersomnia with sleep apnea 11/06/2015  . OSA (obstructive sleep apnea) 11/06/2015    Past Surgical History:  Procedure Laterality Date  . INSERTION OF MESH N/A 11/03/2017   Procedure: INSERTION OF MESH;  Surgeon: Erroll Luna, MD;  Location: [facility];  Service: General;  Laterality: N/A;  . UMBILICAL HERNIA REPAIR  11/03/2017  . UMBILICAL HERNIA REPAIR N/A 11/03/2017   Procedure: UMBILICAL HERNIA REPAIR WITH MESH;  Surgeon: Erroll Luna, MD;  Location: [facility];  Service: General;  Laterality: N/A;       Home Medications    Prior to Admission medications   Medication Sig Start Date End Date Taking? Authorizing  Provider  acetaminophen (TYLENOL) 500 MG tablet Take 1 tablet (500 mg total) by mouth every 6 (six) hours as needed. 01/01/19   Zigmund Gottron, NP  aspirin EC 81 MG tablet Take 81 mg by mouth daily.    [provider]  cyclobenzaprine (FLEXERIL) 10 MG tablet Take 1 tablet (10 mg total) by mouth 2 (two) times daily as needed for muscle spasms. 02/05/19   Lamptey, Myrene Galas, MD  hydrochlorothiazide (HYDRODIURIL) 25 MG tablet Take 1 tablet (25 mg total) by mouth daily. PATIENT NEEDS AN APPOINTMENT 12/25/18   [name], FNP  ibuprofen (ADVIL) 800 MG tablet Take 1 tablet (800 mg total) by mouth every 8 (eight) hours as needed for moderate pain. 02/05/19   LampteyMyrene Galas, MD  meloxicam (MOBIC) 7.5 MG tablet Take 1 tablet (7.5 mg total) by mouth daily. May start once prednisone completed 01/01/19   Augusto Gamble B, NP  predniSONE (DELTASONE) 20 MG tablet Take 1 tablet (20 mg total) by mouth daily for 5 days. 02/05/19 02/10/19  LampteyMyrene Galas, MD  UNABLE TO FIND CPAP: At bedtime    [provider]    Family History Family History  Problem Relation Age of Onset  . Diabetes Father   . Diabetes Brother   . Diabetes Sister   . Aneurysm Paternal Grandmother        brain    Social History Social History   Tobacco Use  .  Smoking status: Former Smoker    Quit date: 03/23/2003    Years since quitting: 15.8  . Smokeless tobacco: Never Used  Substance Use Topics  . Alcohol use: No  . Drug use: No     Allergies   Patient has no known allergies.   Review of Systems Review of Systems  Constitutional: Positive for activity change. Negative for diaphoresis, fatigue and fever.  HENT: Negative.   Respiratory: Negative.  Negative for chest tightness, shortness of breath and wheezing.   Cardiovascular: Negative.  Negative for chest pain and palpitations.  Gastrointestinal: Negative.   Genitourinary: Negative for dysuria, flank pain and hematuria.  Musculoskeletal:  Positive for arthralgias and back pain. Negative for gait problem, joint swelling, myalgias and neck pain.  Skin: Negative.   Neurological: Negative for dizziness, weakness and numbness.     Physical Exam Triage Vital Signs ED Triage Vitals  Enc Vitals Group     BP 02/05/19 1043 (!) 142/87     Pulse Rate 02/05/19 1043 81     Resp 02/05/19 1043 18     Temp 02/05/19 1043 98.2 F (36.8 C)     Temp Source 02/05/19 1043 Oral     SpO2 02/05/19 1043 93 %     Weight --      Height --      Head Circumference --      Peak Flow --      Pain Score 02/05/19 1045 9     Pain Loc --      Pain Edu? --      Excl. in [place]? --    No data found.  Updated Vital Signs BP (!) 142/87 (BP Location: Right Arm)   Pulse 81   Temp 98.2 F (36.8 C) (Oral)   Resp 18   SpO2 93%   Visual Acuity Right Eye Distance:   Left Eye Distance:   Bilateral Distance:    Right Eye Near:   Left Eye Near:    Bilateral Near:     Physical Exam Constitutional:      General: He is in acute distress.     Appearance: He is not ill-appearing.  HENT:     Mouth/Throat:     Mouth: Mucous membranes are moist.     Pharynx: No oropharyngeal exudate or posterior oropharyngeal erythema.  Neck:     Musculoskeletal: Normal range of motion and neck supple. No neck rigidity or muscular tenderness.  Cardiovascular:     Rate and Rhythm: Normal rate and regular rhythm.     Pulses: Normal pulses.  Pulmonary:     Effort: Pulmonary effort is normal. No respiratory distress.     Breath sounds: No rhonchi or rales.  Abdominal:     General: Bowel sounds are normal. There is no distension.     Palpations: Abdomen is soft. There is no mass.     Tenderness: There is no rebound.  Musculoskeletal:        General: No swelling or tenderness.     Comments: Range of motion of the lumbosacral spine is limited secondary to pain  Lymphadenopathy:     Cervical: No cervical adenopathy.  Skin:    General: Skin is warm.     Capillary  Refill: Capillary refill takes less than 2 seconds.     Coloration: Skin is not jaundiced.     Findings: No erythema or lesion.  Neurological:     General: No focal deficit present.     Mental Status: He  is alert and oriented to person, place, and time.     Cranial Nerves: No cranial nerve deficit.     Sensory: No sensory deficit.      UC Treatments / Results  Labs (all labs ordered are listed, but only abnormal results are displayed) Labs Reviewed - No data to display  EKG   Radiology No results found.  Procedures Procedures (including critical care time)  Medications Ordered in UC Medications  ketorolac (TORADOL) injection 60 mg (60 mg Intramuscular Given 02/05/19 1130)  dexamethasone (DECADRON) injection 10 mg (10 mg Intramuscular Given 02/05/19 1131)  dexamethasone (DECADRON) 10 MG/ML injection (has no administration in time range)  ketorolac (TORADOL) 60 MG/2ML injection (has no administration in time range)    Initial Impression / Assessment and Plan / UC Course  I have reviewed the triage vital signs and the nursing notes.  Pertinent labs & imaging results that were available during my care of the patient were reviewed by me and considered in my medical decision making (see chart for details).    1.  Sciatica of left side: Toradol 60 mg IM x1 dose Dexamethasone 10 mg IM x1 dose Heat therapy Gentle stretches Prednisone 20 mg daily for 5 days Ibuprofen 800 mg 3 times daily Final Clinical Impressions(s) / UC Diagnoses  Ibuprofen 800 mg 3 times daily as needed Flexeril 10 mg twice daily as needed for muscle spasms If patient's pain is persistent or if he experiences numbness or weakness in the leg he is advised to go to ED for further evaluation. Final diagnoses:  Sciatica of left side   Discharge Instructions   None    ED Prescriptions    Medication Sig Dispense Auth. Provider   predniSONE (DELTASONE) 20 MG tablet Take 1 tablet (20 mg total) by mouth  daily for 5 days. 5 tablet Lamptey, Myrene Galas, MD   ibuprofen (ADVIL) 800 MG tablet Take 1 tablet (800 mg total) by mouth every 8 (eight) hours as needed for moderate pain. 21 tablet Lamptey, Myrene Galas, MD   cyclobenzaprine (FLEXERIL) 10 MG tablet Take 1 tablet (10 mg total) by mouth 2 (two) times daily as needed for muscle spasms. 20 tablet Lamptey, Myrene Galas, MD     PDMP not reviewed this encounter.   Chase Picket, MD 02/07/19 1049

## 2019-02-07 ENCOUNTER — Encounter: Payer: Self-pay | Admitting: Nurse Practitioner

## 2019-02-07 ENCOUNTER — Other Ambulatory Visit: Payer: Self-pay

## 2019-02-07 ENCOUNTER — Ambulatory Visit: Payer: BC Managed Care – PPO | Admitting: Nurse Practitioner

## 2019-02-07 VITALS — BP 138/88 | HR 92 | Temp 97.4°F | Ht 74.0 in | Wt 348.6 lb

## 2019-02-07 DIAGNOSIS — M5442 Lumbago with sciatica, left side: Secondary | ICD-10-CM

## 2019-02-07 NOTE — Patient Instructions (Signed)
Sciatica  Sciatica is pain, weakness, tingling, or loss of feeling (numbness) along the sciatic nerve. The sciatic nerve starts in the lower back and goes down the back of each leg. Sciatica usually goes away on its own or with treatment. Sometimes, sciatica may come back (recur). What are the causes? This condition happens when the sciatic nerve is pinched or has pressure put on it. This may be the result of:  A disk in between the bones of the spine bulging out too far (herniated disk).  Changes in the spinal disks that occur with aging.  A condition that affects a muscle in the butt.  Extra bone growth near the sciatic nerve.  A break (fracture) of the area between your hip bones (pelvis).  Pregnancy.  Tumor. This is rare. What increases the risk? You are more likely to develop this condition if you:  Play sports that put pressure or stress on the spine.  Have poor strength and ease of movement (flexibility).  Have had a back injury in the past.  Have had back surgery.  Sit for long periods of time.  Do activities that involve bending or lifting over and over again.  Are very overweight (obese). What are the signs or symptoms? Symptoms can vary from mild to very bad. They may include:  Any of these problems in the lower back, leg, hip, or butt: ? Mild tingling, loss of feeling, or dull aches. ? Burning sensations. ? Sharp pains.  Loss of feeling in the back of the calf or the sole of the foot.  Leg weakness.  Very bad back pain that makes it hard to move. These symptoms may get worse when you cough, sneeze, or laugh. They may also get worse when you sit or stand for long periods of time. How is this treated? This condition often gets better without any treatment. However, treatment may include:  Changing or cutting back on physical activity when you have pain.  Doing exercises and stretching.  Putting ice or heat on the affected area.  Medicines that help:  ? To relieve pain and swelling. ? To relax your muscles.  Shots (injections) of medicines that help to relieve pain, irritation, and swelling.  Surgery. Follow these instructions at home: Medicines  Take over-the-counter and prescription medicines only as told by your doctor.  Ask your doctor if the medicine prescribed to you: ? Requires you to avoid driving or using heavy machinery. ? Can cause trouble pooping (constipation). You may need to take these steps to prevent or treat trouble pooping:  Drink enough fluids to keep your pee (urine) pale yellow.  Take over-the-counter or prescription medicines.  Eat foods that are high in fiber. These include beans, whole grains, and fresh fruits and vegetables.  Limit foods that are high in fat and sugar. These include fried or sweet foods. Managing pain      If told, put ice on the affected area. ? Put ice in a plastic bag. ? Place a towel between your skin and the bag. ? Leave the ice on for 20 minutes, 2-3 times a day.  If told, put heat on the affected area. Use the heat source that your doctor tells you to use, such as a moist heat pack or a heating pad. ? Place a towel between your skin and the heat source. ? Leave the heat on for 20-30 minutes. ? Remove the heat if your skin turns bright red. This is very important if you are   unable to feel pain, heat, or cold. You may have a greater risk of getting burned. Activity   Return to your normal activities as told by your doctor. Ask your doctor what activities are safe for you.  Avoid activities that make your symptoms worse.  Take short rests during the day. ? When you rest for a long time, do some physical activity or stretching between periods of rest. ? Avoid sitting for a long time without moving. Get up and move around at least one time each hour.  Exercise and stretch regularly, as told by your doctor.  Do not lift anything that is heavier than 10 lb (4.5 kg) while  you have symptoms of sciatica. ? Avoid lifting heavy things even when you do not have symptoms. ? Avoid lifting heavy things over and over.  When you lift objects, always lift in a way that is safe for your body. To do this, you should: ? Bend your knees. ? Keep the object close to your body. ? Avoid twisting. General instructions  Stay at a healthy weight.  Wear comfortable shoes that support your feet. Avoid wearing high heels.  Avoid sleeping on a mattress that is too soft or too hard. You might have less pain if you sleep on a mattress that is firm enough to support your back.  Keep all follow-up visits as told by your doctor. This is important. Contact a doctor if:  You have pain that: ? Wakes you up when you are sleeping. ? Gets worse when you lie down. ? Is worse than the pain you have had in the past. ? Lasts longer than 4 weeks.  You lose weight without trying. Get help right away if:  You cannot control when you pee (urinate) or poop (have a bowel movement).  You have weakness in any of these areas and it gets worse: ? Lower back. ? The area between your hip bones. ? Butt. ? Legs.  You have redness or swelling of your back.  You have a burning feeling when you pee. Summary  Sciatica is pain, weakness, tingling, or loss of feeling (numbness) along the sciatic nerve.  This condition happens when the sciatic nerve is pinched or has pressure put on it.  Sciatica can cause pain, tingling, or loss of feeling (numbness) in the lower back, legs, hips, and butt.  Treatment often includes rest, exercise, medicines, and putting ice or heat on the affected area. This information is not intended to replace advice given to you by your health care provider. Make sure you discuss any questions you have with your health care provider. Document Released: 12/16/2007 Document Revised: 03/27/2018 Document Reviewed: 03/27/2018 Elsevier Patient Education  2020 [employer]    Back Exercises These exercises help to make your trunk and back strong. They also help to keep the lower back flexible. Doing these exercises can help to prevent back pain or lessen existing pain.  If you have back pain, try to do these exercises 2-3 times each day or as told by your doctor.  As you get better, do the exercises once each day. Repeat the exercises more often as told by your doctor.  To stop back pain from coming back, do the exercises once each day, or as told by your doctor. Exercises Single knee to chest Do these steps 3-5 times in a row for each leg: 1. Lie on your back on a firm bed or the floor with your legs stretched out. 2. Bring   one knee to your chest. 3. Grab your knee or thigh with both hands and hold them it in place. 4. Pull on your knee until you feel a gentle stretch in your lower back or buttocks. 5. Keep doing the stretch for 10-30 seconds. 6. Slowly let go of your leg and straighten it. Pelvic tilt Do these steps 5-10 times in a row: 1. Lie on your back on a firm bed or the floor with your legs stretched out. 2. Bend your knees so they point up to the ceiling. Your feet should be flat on the floor. 3. Tighten your lower belly (abdomen) muscles to press your lower back against the floor. This will make your tailbone point up to the ceiling instead of pointing down to your feet or the floor. 4. Stay in this position for 5-10 seconds while you gently tighten your muscles and breathe evenly. Cat-cow Do these steps until your lower back bends more easily: 1. Get on your hands and knees on a firm surface. Keep your hands under your shoulders, and keep your knees under your hips. You may put padding under your knees. 2. Let your head hang down toward your chest. Tighten (contract) the muscles in your belly. Point your tailbone toward the floor so your lower back becomes rounded like the back of a cat. 3. Stay in this position for 5 seconds. 4. Slowly lift your  head. Let the muscles of your belly relax. Point your tailbone up toward the ceiling so your back forms a sagging arch like the back of a cow. 5. Stay in this position for 5 seconds.  Press-ups Do these steps 5-10 times in a row: 1. Lie on your belly (face-down) on the floor. 2. Place your hands near your head, about shoulder-width apart. 3. While you keep your back relaxed and keep your hips on the floor, slowly straighten your arms to raise the top half of your body and lift your shoulders. Do not use your back muscles. You may change where you place your hands in order to make yourself more comfortable. 4. Stay in this position for 5 seconds. 5. Slowly return to lying flat on the floor.  Bridges Do these steps 10 times in a row: 1. Lie on your back on a firm surface. 2. Bend your knees so they point up to the ceiling. Your feet should be flat on the floor. Your arms should be flat at your sides, next to your body. 3. Tighten your butt muscles and lift your butt off the floor until your waist is almost as high as your knees. If you do not feel the muscles working in your butt and the back of your thighs, slide your feet 1-2 inches farther away from your butt. 4. Stay in this position for 3-5 seconds. 5. Slowly lower your butt to the floor, and let your butt muscles relax. If this exercise is too easy, try doing it with your arms crossed over your chest. Belly crunches Do these steps 5-10 times in a row: 1. Lie on your back on a firm bed or the floor with your legs stretched out. 2. Bend your knees so they point up to the ceiling. Your feet should be flat on the floor. 3. Cross your arms over your chest. 4. Tip your chin a little bit toward your chest but do not bend your neck. 5. Tighten your belly muscles and slowly raise your chest just enough to lift your shoulder blades a tiny   bit off of the floor. Avoid raising your body higher than that, because it can put too much stress on your low  back. 6. Slowly lower your chest and your head to the floor. Back lifts Do these steps 5-10 times in a row: 1. Lie on your belly (face-down) with your arms at your sides, and rest your forehead on the floor. 2. Tighten the muscles in your legs and your butt. 3. Slowly lift your chest off of the floor while you keep your hips on the floor. Keep the back of your head in line with the curve in your back. Look at the floor while you do this. 4. Stay in this position for 3-5 seconds. 5. Slowly lower your chest and your face to the floor. Contact a doctor if:  Your back pain gets a lot worse when you do an exercise.  Your back pain does not get better 2 hours after you exercise. If you have any of these problems, stop doing the exercises. Do not do them again unless your doctor says it is okay. Get help right away if:  You have sudden, very bad back pain. If this happens, stop doing the exercises. Do not do them again unless your doctor says it is okay. This information is not intended to replace advice given to you by your health care provider. Make sure you discuss any questions you have with your health care provider. Document Released: 04/10/2010 Document Revised: 12/01/2017 Document Reviewed: 12/01/2017 Elsevier Patient Education  2020 Elsevier Inc.   

## 2019-02-07 NOTE — Progress Notes (Signed)
Subjective:     Patient ID: Luis Lopez , male    DOB: 10-10-1975 , 43 y.o.   MRN: [id number]   Chief Complaint  Patient presents with  . Back Pain    Patient stated he went to urgent care and was told he has some inflammation in his back. patient stated he would like to be referred to someone for his back    HPI  He went to urgent care and received pain medication and steroid injection.  He is having pain shooting down his left leg.  Difficult to lay down and rest and sit up in one position.        Back Pain This is a new problem. The current episode started more than 1 month ago (2 months ago). The pain is present in the lumbar spine. The quality of the pain is described as shooting. Radiates to: left lower extremity. The symptoms are aggravated by sitting, lying down and bending. Pertinent negatives include no headaches, numbness, tingling or weakness. He has tried NSAIDs and analgesics for the symptoms. The treatment provided mild relief.     Past Medical History:  Diagnosis Date  . Hypertension    borderline  . PVC (premature ventricular contraction) 04/29/2018  . Sleep apnea    uses a cpap  . Snoring      Family History  Problem Relation Age of Onset  . Diabetes Father   . Diabetes Brother   . Diabetes Sister   . Aneurysm Paternal Grandmother        brain     Current Outpatient Medications:  .  acetaminophen (TYLENOL) 500 MG tablet, Take 1 tablet (500 mg total) by mouth every 6 (six) hours as needed., Disp: 30 tablet, Rfl: 0 .  aspirin EC 81 MG tablet, Take 81 mg by mouth daily., Disp: , Rfl:  .  cyclobenzaprine (FLEXERIL) 10 MG tablet, Take 1 tablet (10 mg total) by mouth 2 (two) times daily as needed for muscle spasms., Disp: 20 tablet, Rfl: 0 .  hydrochlorothiazide (HYDRODIURIL) 25 MG tablet, Take 1 tablet (25 mg total) by mouth daily. PATIENT NEEDS AN APPOINTMENT, Disp: 90 tablet, Rfl: 1 .  meloxicam (MOBIC) 7.5 MG tablet, Take 1 tablet (7.5 mg total) by mouth  daily. May start once prednisone completed, Disp: 20 tablet, Rfl: 0 .  predniSONE (DELTASONE) 20 MG tablet, Take 1 tablet (20 mg total) by mouth daily for 5 days., Disp: 5 tablet, Rfl: 0 .  UNABLE TO FIND, CPAP: At bedtime, Disp: , Rfl:  .  ibuprofen (ADVIL) 800 MG tablet, Take 1 tablet (800 mg total) by mouth every 8 (eight) hours as needed for moderate pain. (Patient not taking: Reported on 02/07/2019), Disp: 21 tablet, Rfl: 0   No Known Allergies   Review of Systems  Constitutional: Negative.   Respiratory: Negative.   Cardiovascular: Negative.   Endocrine: Negative for polydipsia, polyphagia and polyuria.  Musculoskeletal: Positive for back pain. Negative for arthralgias.  Neurological: Negative for dizziness, tingling, weakness, numbness and headaches.  Psychiatric/Behavioral: Negative.      Today's Vitals   02/07/19 1500  BP: 138/88  Pulse: 92  Temp: (!) 97.4 F (36.3 C)  TempSrc: Oral  Weight: (!) 348 lb 9.6 oz (158.1 kg)  Height: 6\' 2"  (1.88 m)  PainSc: 6   PainLoc: Back   Body mass index is 44.76 kg/m.   Objective:  Physical Exam Constitutional:      Appearance: Normal appearance.  Cardiovascular:  Rate and Rhythm: Normal rate and regular rhythm.     Pulses: Normal pulses.     Heart sounds: Normal heart sounds. No murmur.  Pulmonary:     Effort: Pulmonary effort is normal. No respiratory distress.     Breath sounds: Normal breath sounds.  Musculoskeletal:        General: Tenderness (low back pain midline and bilateral flank area illicited on palpation) present. No swelling.     Right lower leg: No edema.     Comments: Guarded movement  Skin:    Capillary Refill: Capillary refill takes less than 2 seconds.  Neurological:     General: No focal deficit present.     Mental Status: He is alert and oriented to person, place, and time.          Assessment And Plan:     1. Acute left-sided low back pain with left-sided sciatica  One week history of low  back pain not improving and reoccurring  NSAIDs have been ineffective  He was treated with steroids from urgent care but would like a referral to orthopedics  I will check an xray to start the process to check for any structural damage - DG Lumbar Spine Complete; Future - Ambulatory referral to Orthopedic Surgery   2. Morbid obesity due to excess calories ([place])  He has actually lost about 20 lbs in the last 3 months  Continue with healthy diet and physical activity as tolerated  [name], FNP    THE PATIENT IS ENCOURAGED TO PRACTICE SOCIAL DISTANCING DUE TO THE COVID-19 PANDEMIC.

## 2019-02-12 ENCOUNTER — Ambulatory Visit
Admission: RE | Admit: 2019-02-12 | Discharge: 2019-02-12 | Disposition: A | Discharge status: Home / Self Care | Payer: BC Managed Care – PPO | Source: Ambulatory Visit | Attending: Nurse Practitioner | Admitting: Nurse Practitioner

## 2019-02-12 ENCOUNTER — Other Ambulatory Visit: Payer: Self-pay

## 2019-02-12 DIAGNOSIS — M545 Low back pain: Secondary | ICD-10-CM | POA: Diagnosis not present

## 2019-02-12 DIAGNOSIS — M5442 Lumbago with sciatica, left side: Secondary | ICD-10-CM

## 2019-02-12 IMAGING — CR DG LUMBAR SPINE COMPLETE 4+V
5 series · 5 of 5 positions shown · non-contrast
Comparison: [X7]

CLINICAL DATA: Low back and leg pain

EXAM:
LUMBAR SPINE - COMPLETE 4+ VIEW

[t l-spine a.p. *]
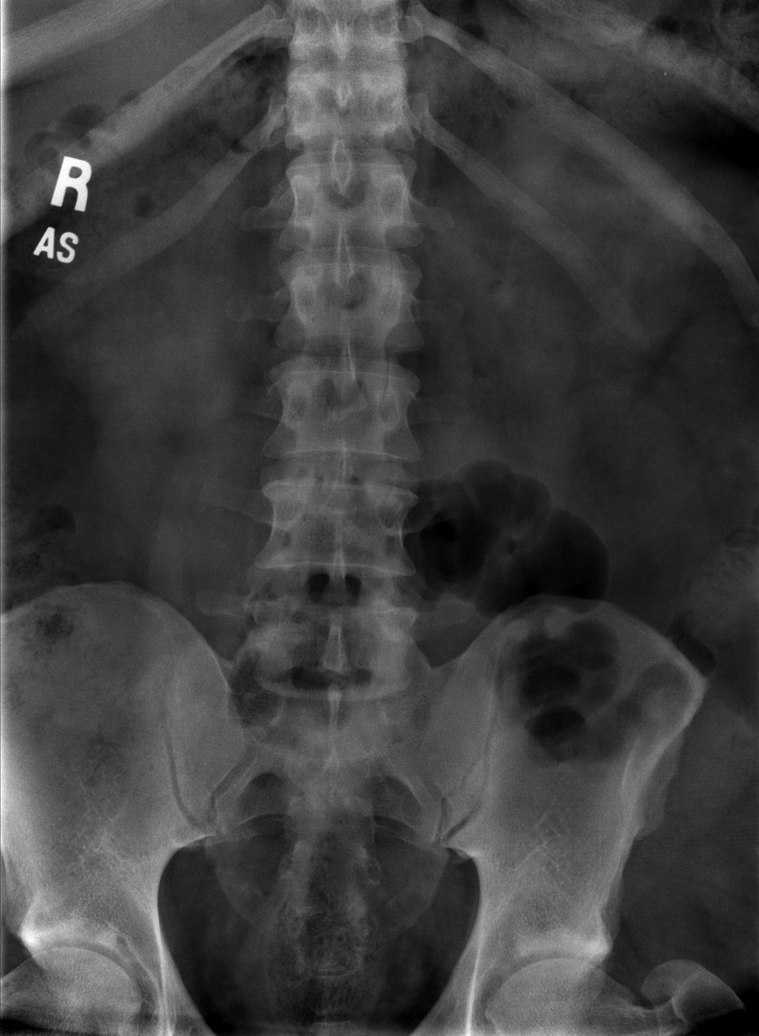

[t l-spine oblique exposure * (1 of 2)]
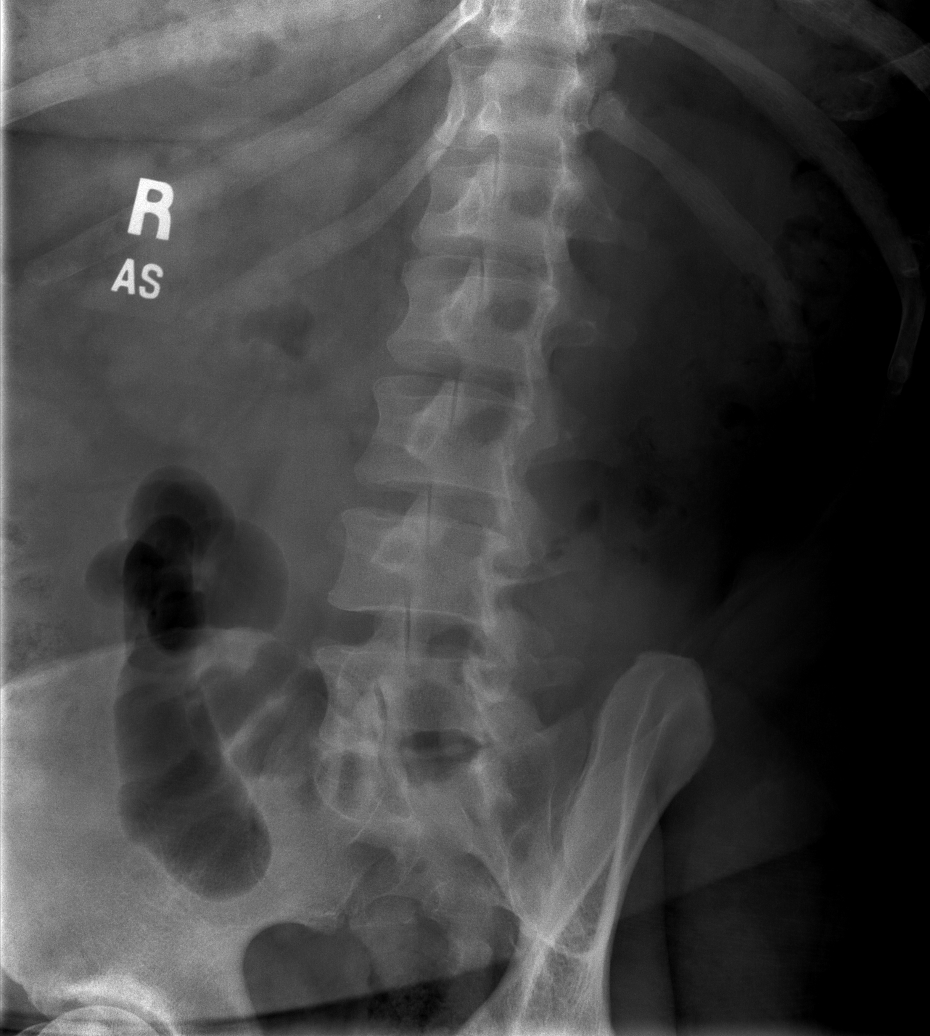

[t l-spine oblique exposure * (2 of 2)]
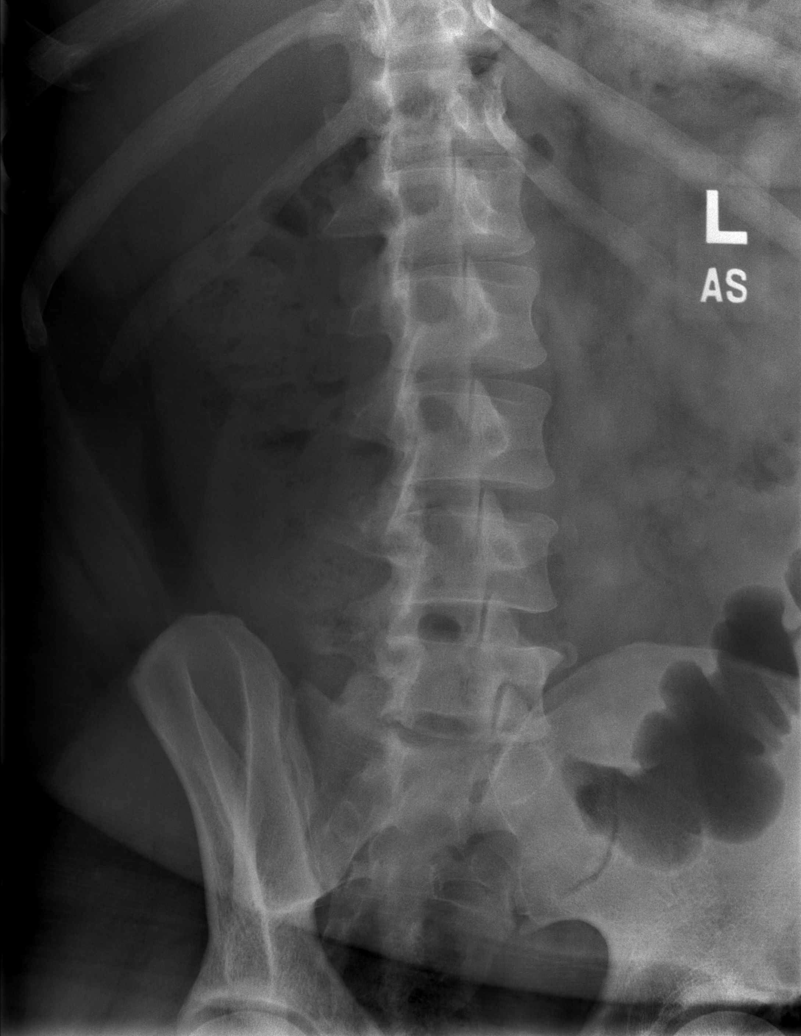

[t l-spine lat *]
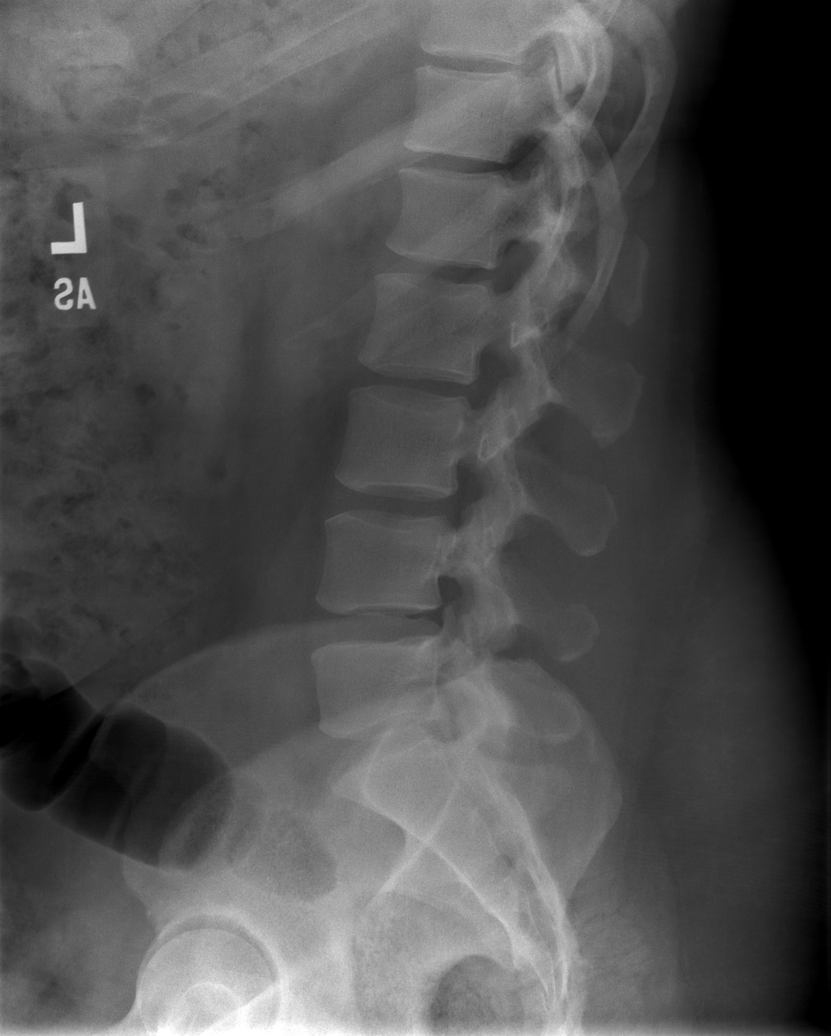

[t l-spine l5-s1 spot *]
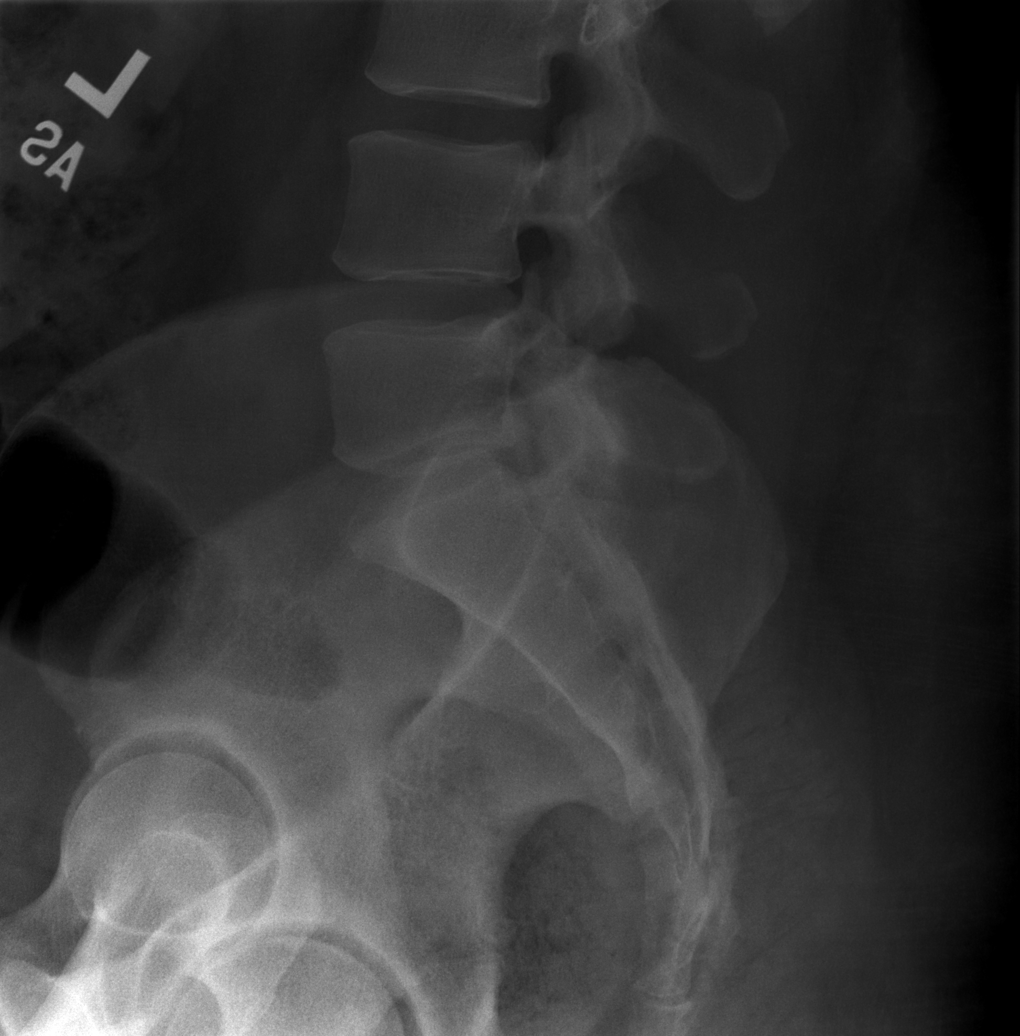

[5 of 5 positions shown; findings below may reference images not displayed]

FINDINGS: Mild retrolisthesis at L5-S1. No spondylolysis identified. Vertebral
body heights are maintained. There is no acute fracture.
Intervertebral disc heights are preserved. No significant facet
arthropathy.
IMPRESSION: No significant osseous abnormality.

## 2019-02-13 ENCOUNTER — Other Ambulatory Visit: Payer: Self-pay | Admitting: Nurse Practitioner

## 2019-02-13 ENCOUNTER — Encounter: Payer: Self-pay | Admitting: Nurse Practitioner

## 2019-02-13 ENCOUNTER — Ambulatory Visit
Admission: RE | Admit: 2019-02-13 | Discharge: 2019-02-13 | Disposition: A | Discharge status: Home / Self Care | Payer: BC Managed Care – PPO | Source: Ambulatory Visit | Attending: Nurse Practitioner | Admitting: Nurse Practitioner

## 2019-02-13 DIAGNOSIS — M5442 Lumbago with sciatica, left side: Secondary | ICD-10-CM

## 2019-03-19 ENCOUNTER — Ambulatory Visit (HOSPITAL_COMMUNITY)
Admission: EM | Admit: 2019-03-19 | Discharge: 2019-03-19 | Disposition: A | Discharge status: Home / Self Care | Payer: BC Managed Care – PPO | Attending: Family Medicine | Admitting: Family Medicine

## 2019-03-19 ENCOUNTER — Encounter (HOSPITAL_COMMUNITY): Payer: Self-pay | Admitting: Emergency Medicine

## 2019-03-19 ENCOUNTER — Other Ambulatory Visit: Payer: Self-pay

## 2019-03-19 DIAGNOSIS — M5442 Lumbago with sciatica, left side: Secondary | ICD-10-CM

## 2019-03-19 DIAGNOSIS — G8929 Other chronic pain: Secondary | ICD-10-CM | POA: Diagnosis not present

## 2019-03-19 MED ORDER — KETOROLAC TROMETHAMINE 60 MG/2ML IM SOLN
INTRAMUSCULAR | Status: AC
  Filled 2019-03-19: qty 2

## 2019-03-19 MED ORDER — METHYLPREDNISOLONE ACETATE 80 MG/ML IJ SUSP
INTRAMUSCULAR | Status: AC
  Filled 2019-03-19: qty 1

## 2019-03-19 MED ORDER — METHYLPREDNISOLONE ACETATE 80 MG/ML IJ SUSP
80.0000 mg | Freq: Once | INTRAMUSCULAR | Status: AC
  Administered 2019-03-19: 80 mg via INTRAMUSCULAR

## 2019-03-19 MED ORDER — IBUPROFEN 800 MG PO TABS: 800.0000 mg | ORAL_TABLET | Freq: Three times a day (TID) | ORAL | 0 refills | Status: DC | PRN

## 2019-03-19 MED ORDER — TIZANIDINE HCL 4 MG PO TABS: 4.0000 mg | ORAL_TABLET | Freq: Four times a day (QID) | ORAL | 0 refills | Status: DC | PRN

## 2019-03-19 MED ORDER — KETOROLAC TROMETHAMINE 60 MG/2ML IM SOLN
60.0000 mg | Freq: Once | INTRAMUSCULAR | Status: AC
  Administered 2019-03-19: 60 mg via INTRAMUSCULAR

## 2019-03-19 NOTE — ED Provider Notes (Signed)
[facility]    CSN: [id number] Arrival date & time: 03/19/19  1355      History   Chief Complaint Chief Complaint  Patient presents with  . Sciatica    HPI Luis Lopez is a 43 y.o. male.   HPI  Patient has chronic low back pain.  Is been waxing and waning for many months.  He had lumbar films which were normal.  He was referred to an orthopedic.  The orthopedic recommended physical therapy.  This never happened.  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  He has not had any specific accident or injury.  His back pain is flared up the last couple of days.  He continues to have back pain in his left low back that radiates down his left leg.  No numbness.  No weakness.  No bowel or bladder complaint. Patient has morbid obesity.  Past Medical History:  Diagnosis Date  . Hypertension    borderline  . PVC (premature ventricular contraction) 04/29/2018  . Sleep apnea    uses a cpap  . Snoring     Patient Active Problem List   Diagnosis Date Noted  . Muscle spasm 07/26/2018  . Acute bilateral low back pain without sciatica 07/26/2018  . PVC (premature ventricular contraction) 04/29/2018  . Abnormal EKG 03/01/2018  . Essential hypertension 03/01/2018  . Umbilical hernia [id number]  . Morbid obesity due to excess calories ([place]) 11/06/2015  . Nocturia more than twice per night 11/06/2015  . Sleep deprivation 11/06/2015  . Hypersomnia with sleep apnea 11/06/2015  . OSA (obstructive sleep apnea) 11/06/2015    Past Surgical History:  Procedure Laterality Date  . INSERTION OF MESH N/A 11/03/2017   Procedure: INSERTION OF MESH;  Surgeon: Erroll Luna, MD;  Location: [facility];  Service: General;  Laterality: N/A;  . UMBILICAL HERNIA REPAIR  11/03/2017  . UMBILICAL HERNIA REPAIR N/A 11/03/2017   Procedure: UMBILICAL HERNIA REPAIR WITH MESH;  Surgeon: Erroll Luna,  MD;  Location: [facility];  Service: General;  Laterality: N/A;       Home Medications    Prior to Admission medications   Medication Sig Start Date End Date Taking? Authorizing Provider  acetaminophen (TYLENOL) 500 MG tablet Take 1 tablet (500 mg total) by mouth every 6 (six) hours as needed. 01/01/19   Zigmund Gottron, NP  aspirin EC 81 MG tablet Take 81 mg by mouth daily.    [provider]  hydrochlorothiazide (HYDRODIURIL) 25 MG tablet Take 1 tablet (25 mg total) by mouth daily. PATIENT NEEDS AN APPOINTMENT 12/25/18   [name], FNP  ibuprofen (ADVIL) 800 MG tablet Take 1 tablet (800 mg total) by mouth every 8 (eight) hours as needed for moderate pain. 03/19/19   Raylene Everts, MD  tiZANidine (ZANAFLEX) 4 MG tablet Take 1-2 tablets (4-8 mg total) by mouth every 6 (six) hours as needed for muscle spasms. 03/19/19   Raylene Everts, MD  UNABLE TO FIND CPAP: At bedtime    [provider]    Family History Family History  Problem Relation Age of Onset  . Diabetes Father   . Diabetes Brother   . Diabetes Sister   . Aneurysm Paternal Grandmother        brain    Social History Social History   Tobacco Use  . Smoking status: Former  Smoker    Quit date: 03/23/2003    Years since quitting: 16.0  . Smokeless tobacco: Never Used  Substance Use Topics  . Alcohol use: No  . Drug use: No     Allergies   Patient has no known allergies.   Review of Systems Review of Systems  Constitutional: Negative for chills and fever.  HENT: Negative for congestion and hearing loss.   Eyes: Negative for pain.  Respiratory: Negative for cough and shortness of breath.   Cardiovascular: Negative for chest pain and leg swelling.  Gastrointestinal: Negative for abdominal pain, constipation and diarrhea.  Genitourinary: Negative for dysuria and frequency.  Musculoskeletal: Positive for back pain. Negative for myalgias.  Neurological: Negative for dizziness, seizures and  headaches.  Psychiatric/Behavioral: The patient is not nervous/anxious.      Physical Exam Triage Vital Signs ED Triage Vitals  Enc Vitals Group     BP 03/19/19 1537 (!) 146/85     Pulse Rate 03/19/19 1537 100     Resp 03/19/19 1536 16     Temp 03/19/19 1537 98.1 F (36.7 C)     Temp Source 03/19/19 1537 Oral     SpO2 03/19/19 1537 97 %     Weight --      Height --      Head Circumference --      Peak Flow --      Pain Score 03/19/19 1536 8     Pain Loc --      Pain Edu? --      Excl. in [place]? --    No data found.  Updated Vital Signs BP (!) 146/85   Pulse 100   Temp 98.1 F (36.7 C) (Oral)   Resp 16   SpO2 97%   Visual Acuity Right Eye Distance:   Left Eye Distance:   Bilateral Distance:    Right Eye Near:   Left Eye Near:    Bilateral Near:     Physical Exam Constitutional:      General: He is not in acute distress.    Appearance: He is well-developed.  HENT:     Head: Normocephalic and atraumatic.  Eyes:     Conjunctiva/sclera: Conjunctivae normal.     Pupils: Pupils are equal, round, and reactive to light.  Cardiovascular:     Rate and Rhythm: Normal rate.  Pulmonary:     Effort: Pulmonary effort is normal. No respiratory distress.  Abdominal:     General: There is no distension.     Palpations: Abdomen is soft.  Musculoskeletal:        General: Normal range of motion.     Cervical back: Normal range of motion.     Comments: Slow range of motion.  Cannot flex to fingertips below his knees  Skin:    General: Skin is warm and dry.  Neurological:     General: No focal deficit present.     Mental Status: He is alert.     Motor: No weakness.     Deep Tendon Reflexes: Reflexes normal.  Psychiatric:        Mood and Affect: Mood normal.        Behavior: Behavior normal.      UC Treatments / Results  Labs (all labs ordered are listed, but only abnormal results are displayed) Labs Reviewed - No data to display  EKG   Radiology No results  found.  Procedures Procedures (including critical care time)  Medications Ordered in UC Medications  ketorolac (TORADOL) injection 60 mg (60 mg Intramuscular Given 03/19/19 1734)  methylPREDNISolone acetate (DEPO-MEDROL) injection 80 mg (80 mg Intramuscular Given 03/19/19 1734)    Initial Impression / Assessment and Plan / UC Course  I have reviewed the triage vital signs and the nursing notes.  Pertinent labs & imaging results that were available during my care of the patient were reviewed by me and considered in my medical decision making (see chart for details).     I encourage the patient to pursue physical therapy.  Exercise is going to be helpful for him. Final Clinical Impressions(s) / UC Diagnoses   Final diagnoses:  Chronic left-sided low back pain with left-sided sciatica     Discharge Instructions     Take ibuprofen 3 x a day with food Take the tizanidine as needed muscle relaxer Follow up with your doctor   ED Prescriptions    Medication Sig Dispense Auth. Provider   ibuprofen (ADVIL) 800 MG tablet Take 1 tablet (800 mg total) by mouth every 8 (eight) hours as needed for moderate pain. 90 tablet Raylene Everts, MD   tiZANidine (ZANAFLEX) 4 MG tablet Take 1-2 tablets (4-8 mg total) by mouth every 6 (six) hours as needed for muscle spasms. 21 tablet Raylene Everts, MD     PDMP not reviewed this encounter.   Raylene Everts, MD 03/19/19 2131

## 2019-03-19 NOTE — ED Triage Notes (Signed)
Left lower back pain that radiates into left leg. He was seen here for this 11/16. It waxes and wanes, has been significantly worse over last two days.

## 2019-03-19 NOTE — Discharge Instructions (Signed)
Take ibuprofen 3 x a day with food Take the tizanidine as needed muscle relaxer Follow up with your doctor

## 2019-03-25 ENCOUNTER — Other Ambulatory Visit: Payer: Self-pay

## 2019-03-25 ENCOUNTER — Emergency Department (HOSPITAL_COMMUNITY)
Admission: EM | Admit: 2019-03-25 | Discharge: 2019-03-25 | Disposition: A | Discharge status: Home / Self Care | Payer: BC Managed Care – PPO | Source: Home / Self Care | Attending: Emergency Medicine | Admitting: Emergency Medicine

## 2019-03-25 ENCOUNTER — Emergency Department (HOSPITAL_COMMUNITY): Payer: BC Managed Care – PPO

## 2019-03-25 ENCOUNTER — Emergency Department (HOSPITAL_COMMUNITY)
Admission: EM | Admit: 2019-03-25 | Discharge: 2019-03-25 | Discharge status: Against Medical Advice | Payer: BC Managed Care – PPO | Attending: Emergency Medicine | Admitting: Emergency Medicine

## 2019-03-25 ENCOUNTER — Encounter (HOSPITAL_COMMUNITY): Payer: Self-pay

## 2019-03-25 DIAGNOSIS — Z5321 Procedure and treatment not carried out due to patient leaving prior to being seen by health care provider: Secondary | ICD-10-CM | POA: Diagnosis not present

## 2019-03-25 DIAGNOSIS — Y929 Unspecified place or not applicable: Secondary | ICD-10-CM | POA: Insufficient documentation

## 2019-03-25 DIAGNOSIS — Y939 Activity, unspecified: Secondary | ICD-10-CM | POA: Insufficient documentation

## 2019-03-25 DIAGNOSIS — X58XXXA Exposure to other specified factors, initial encounter: Secondary | ICD-10-CM | POA: Diagnosis not present

## 2019-03-25 DIAGNOSIS — S61012A Laceration without foreign body of left thumb without damage to nail, initial encounter: Secondary | ICD-10-CM

## 2019-03-25 DIAGNOSIS — Y999 Unspecified external cause status: Secondary | ICD-10-CM | POA: Insufficient documentation

## 2019-03-25 DIAGNOSIS — W260XXA Contact with knife, initial encounter: Secondary | ICD-10-CM | POA: Insufficient documentation

## 2019-03-25 DIAGNOSIS — Y93G1 Activity, food preparation and clean up: Secondary | ICD-10-CM | POA: Insufficient documentation

## 2019-03-25 DIAGNOSIS — Z23 Encounter for immunization: Secondary | ICD-10-CM | POA: Insufficient documentation

## 2019-03-25 DIAGNOSIS — Z79899 Other long term (current) drug therapy: Secondary | ICD-10-CM | POA: Insufficient documentation

## 2019-03-25 DIAGNOSIS — Z87891 Personal history of nicotine dependence: Secondary | ICD-10-CM | POA: Insufficient documentation

## 2019-03-25 DIAGNOSIS — I10 Essential (primary) hypertension: Secondary | ICD-10-CM | POA: Insufficient documentation

## 2019-03-25 DIAGNOSIS — Z7982 Long term (current) use of aspirin: Secondary | ICD-10-CM | POA: Insufficient documentation

## 2019-03-25 IMAGING — DX DG FINGER THUMB 2+V*L*
3 series · 3 of 3 positions shown · non-contrast
Comparison: None.

CLINICAL DATA: Recent laceration to the thumb, initial encounter

EXAM:
LEFT THUMB 2+V

[finger ap]
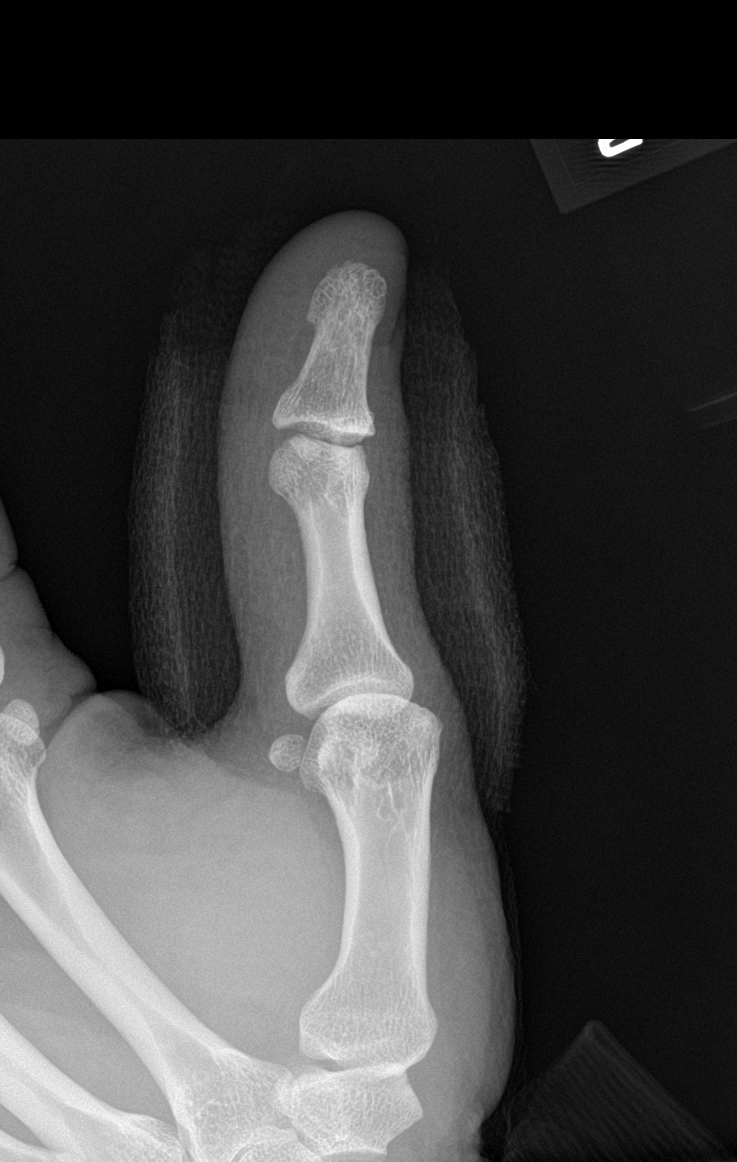

[finger obl]
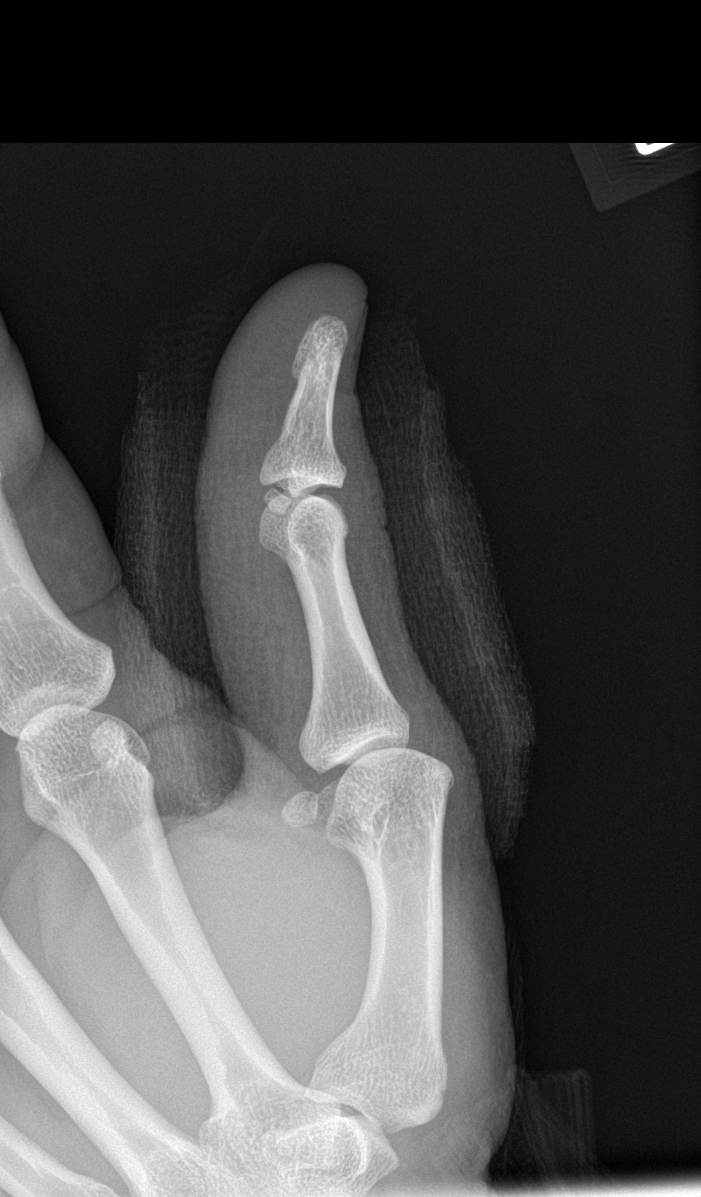

[finger lat]
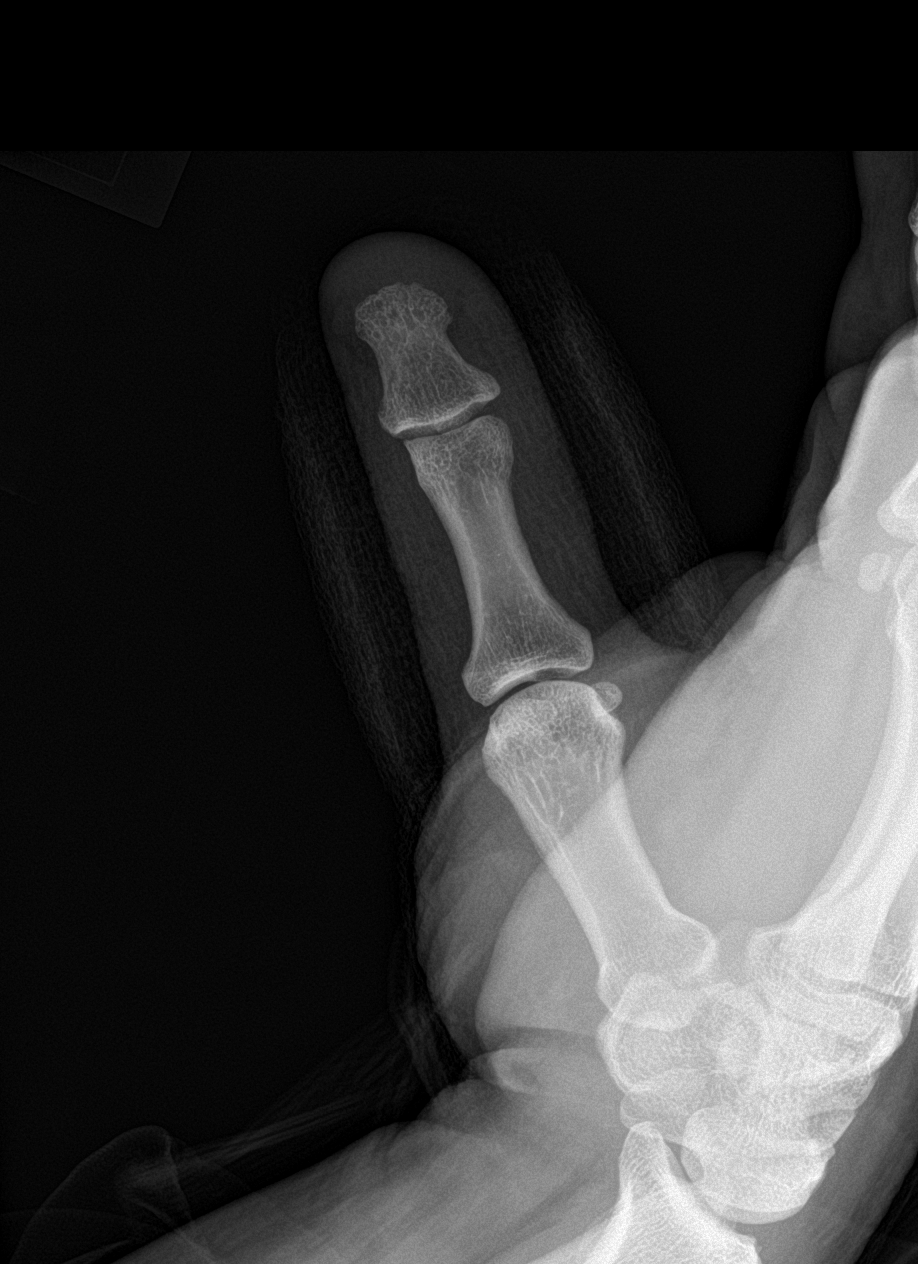

[3 of 3 positions shown; findings below may reference images not displayed]

FINDINGS: No acute fracture or dislocation is noted. No radiopaque foreign
body is noted. Considerable soft tissue dressing is seen.
IMPRESSION: No acute bony abnormality is noted.

## 2019-03-25 MED ORDER — TETANUS-DIPHTH-ACELL PERTUSSIS 5-2.5-18.5 LF-MCG/0.5 IM SUSP
0.5000 mL | Freq: Once | INTRAMUSCULAR | Status: AC
  Administered 2019-03-25: 0.5 mL via INTRAMUSCULAR
  Filled 2019-03-25: qty 0.5

## 2019-03-25 MED ORDER — LIDOCAINE-EPINEPHRINE (PF) 2 %-1:200000 IJ SOLN
INTRAMUSCULAR | Status: AC
  Administered 2019-03-25: 20 mL
  Filled 2019-03-25: qty 20

## 2019-03-25 NOTE — ED Triage Notes (Signed)
Called and no answer.

## 2019-03-25 NOTE — ED Provider Notes (Signed)
[name] Note   CSN: [id number] Arrival date & time: 03/25/19  1747     History Chief Complaint  Patient presents with  . Arterial Bleed of Hand    Luis Lopez is a 44 y.o. male.  The history is provided by the patient. No language interpreter was used.   Luis Lopez is a 44 y.o. male who presents to the Emergency Department complaining of hand injury. He presents the emergency department complaining of stab wound to the left hand that occurred about 3 PM today. He was trying to open a package of chicken in his hand slipped, striking his left thumb. He reports brisk, pulsatile bleeding prior to ED arrival. He has been applying towels at home. He is right-hand dominant. He has a history of hypertension, no additional medical problems. Unknown last tetanus shot. He works as a Freight forwarder.    Past Medical History:  Diagnosis Date  . Hypertension    borderline  . PVC (premature ventricular contraction) 04/29/2018  . Sleep apnea    uses a cpap  . Snoring     Patient Active Problem List   Diagnosis Date Noted  . Muscle spasm 07/26/2018  . Acute bilateral low back pain without sciatica 07/26/2018  . PVC (premature ventricular contraction) 04/29/2018  . Abnormal EKG 03/01/2018  . Essential hypertension 03/01/2018  . Umbilical hernia [id number]  . Morbid obesity due to excess calories ([place]) 11/06/2015  . Nocturia more than twice per night 11/06/2015  . Sleep deprivation 11/06/2015  . Hypersomnia with sleep apnea 11/06/2015  . OSA (obstructive sleep apnea) 11/06/2015    Past Surgical History:  Procedure Laterality Date  . INSERTION OF MESH N/A 11/03/2017   Procedure: INSERTION OF MESH;  Surgeon: Erroll Luna, MD;  Location: [facility];  Service: General;  Laterality: N/A;  . UMBILICAL HERNIA REPAIR  11/03/2017  . UMBILICAL HERNIA REPAIR N/A 11/03/2017   Procedure: UMBILICAL HERNIA REPAIR WITH MESH;  Surgeon: Erroll Luna, MD;   Location: [facility];  Service: General;  Laterality: N/A;       Family History  Problem Relation Age of Onset  . Diabetes Father   . Diabetes Brother   . Diabetes Sister   . Aneurysm Paternal Grandmother        brain    Social History   Tobacco Use  . Smoking status: Former Smoker    Quit date: 03/23/2003    Years since quitting: 16.0  . Smokeless tobacco: Never Used  Substance Use Topics  . Alcohol use: No  . Drug use: No    Home Medications Prior to Admission medications   Medication Sig Start Date End Date Taking? Authorizing Provider  acetaminophen (TYLENOL) 500 MG tablet Take 1 tablet (500 mg total) by mouth every 6 (six) hours as needed. 01/01/19   Zigmund Gottron, NP  aspirin EC 81 MG tablet Take 81 mg by mouth daily.    [provider]  hydrochlorothiazide (HYDRODIURIL) 25 MG tablet Take 1 tablet (25 mg total) by mouth daily. PATIENT NEEDS AN APPOINTMENT 12/25/18   [name], FNP  ibuprofen (ADVIL) 800 MG tablet Take 1 tablet (800 mg total) by mouth every 8 (eight) hours as needed for moderate pain. 03/19/19   Raylene Everts, MD  tiZANidine (ZANAFLEX) 4 MG tablet Take 1-2 tablets (4-8 mg total) by mouth every 6 (six) hours as needed for muscle spasms. 03/19/19   Raylene Everts, MD  UNABLE TO FIND CPAP: At bedtime  [provider]    Allergies    Patient has no known allergies.  Review of Systems   Review of Systems  All other systems reviewed and are negative.   Physical Exam Updated Vital Signs BP (!) 160/105 (BP Location: Right Arm)   Pulse 89   Temp 98 F (36.7 C) (Oral)   Resp 16   Ht 6\' 2"  (1.88 m)   Wt (!) 158 kg   SpO2 99%   BMI 44.72 kg/m   Physical Exam Vitals and nursing note reviewed.  Constitutional:      Appearance: He is well-developed.  HENT:     Head: Normocephalic and atraumatic.  Cardiovascular:     Rate and Rhythm: Normal rate and regular rhythm.  Pulmonary:     Effort: Pulmonary effort is normal.  No respiratory distress.  Musculoskeletal:        General: No tenderness.     Comments: Approximately 1 cm laceration to the lateral aspect of the left first phalanx.  Flexion extension is intact at the thumb. Sensation to light touch intact distally.  Skin:    General: Skin is warm and dry.  Neurological:     Mental Status: He is alert and oriented to person, place, and time.  Psychiatric:        Behavior: Behavior normal.     ED Results / Procedures / Treatments   Labs (all labs ordered are listed, but only abnormal results are displayed) Labs Reviewed - No data to display  EKG None  Radiology DG Finger Thumb Left  Result Date: 03/25/2019 CLINICAL DATA:  Recent laceration to the thumb, initial encounter EXAM: LEFT THUMB 2+V COMPARISON:  None. FINDINGS: No acute fracture or dislocation is noted. No radiopaque foreign body is noted. Considerable soft tissue dressing is seen. IMPRESSION: No acute bony abnormality is noted. Electronically Signed   By: Inez Catalina M.D.   On: 03/25/2019 18:48    Procedures .Marland KitchenLaceration Repair  Date/Time: 03/25/2019 6:27 PM Performed by: Quintella Reichert, MD Authorized by: Quintella Reichert, MD   Consent:    Consent obtained:  Verbal   Consent given by:  Patient   Risks discussed:  Infection and pain Anesthesia (see MAR for exact dosages):    Anesthesia method:  Local infiltration   Local anesthetic:  Lidocaine 2% WITH epi Laceration details:    Location:  Finger   Finger location:  L thumb   Length (cm):  1 Repair type:    Repair type:  Simple Pre-procedure details:    Preparation:  Patient was prepped and draped in usual sterile fashion Exploration:    Wound exploration: wound explored through full range of motion     Contaminated: no   Treatment:    Area cleansed with:  Betadine   Amount of cleaning:  Standard Skin repair:    Repair method:  Sutures   Suture size:  4-0   Suture material:  Prolene   Suture technique:  Simple  interrupted   Number of sutures:  2 Approximation:    Approximation:  Close Post-procedure details:    Dressing:  Bulky dressing   (including critical care time)  Medications Ordered in ED Medications  lidocaine-EPINEPHrine (XYLOCAINE W/EPI) 2 %-1:200000 (PF) injection (20 mLs  Given 03/25/19 1811)  Tdap (BOOSTRIX) injection 0.5 mL (0.5 mLs Intramuscular Given 03/25/19 1814)    ED Course  I have reviewed the triage vital signs and the nursing notes.  Pertinent labs & imaging results that were available during my  care of the patient were reviewed by me and considered in my medical decision making (see chart for details).    MDM Rules/Calculators/A&P                     Patient presents for evaluation of laceration/stab wound to the left hand. He had pulsatile bleeding on ED arrival and a tourniquet was placed. Laceration was repaired under a hemostatic field. After tourniquet was taken down there was no recurrent bleeding. A dressing was applied. No evidence of foreign body, tendon injury. Discussed with patient home care for hand laceration. Discussed outpatient follow-up and return precautions.  Final Clinical Impression(s) / ED Diagnoses Final diagnoses:  Thumb laceration, left, initial encounter    Rx / DC Orders ED Discharge Orders    None       Quintella Reichert, MD 03/25/19 1916

## 2019-03-25 NOTE — ED Notes (Signed)
Tournique removed per EDP orders, pressure dsg applied.

## 2019-03-25 NOTE — ED Triage Notes (Signed)
Patient is AOx4 and ambulatory. Patient arrived via POV. Patient cut hand while attempting to open a packet of chicken. Patient did cut upper thumb/hand causing bright red arterial bleed.   CAT applied at 1807. Bleeding is controlled and stopped.

## 2019-03-27 ENCOUNTER — Encounter: Payer: BC Managed Care – PPO | Admitting: Nurse Practitioner

## 2019-04-03 ENCOUNTER — Encounter (HOSPITAL_COMMUNITY): Payer: Self-pay | Admitting: Family Medicine

## 2019-04-03 ENCOUNTER — Other Ambulatory Visit: Payer: Self-pay

## 2019-04-03 ENCOUNTER — Emergency Department (HOSPITAL_COMMUNITY)
Admission: EM | Admit: 2019-04-03 | Discharge: 2019-04-04 | Disposition: A | Discharge status: Home / Self Care | Payer: BC Managed Care – PPO | Attending: Emergency Medicine | Admitting: Emergency Medicine

## 2019-04-03 DIAGNOSIS — M549 Dorsalgia, unspecified: Secondary | ICD-10-CM | POA: Insufficient documentation

## 2019-04-03 DIAGNOSIS — Z5321 Procedure and treatment not carried out due to patient leaving prior to being seen by health care provider: Secondary | ICD-10-CM | POA: Insufficient documentation

## 2019-04-03 NOTE — ED Triage Notes (Signed)
Patient needs stitches removed from left thumb. Also, he is experiencing right lower back that radiates down left leg. Patient is ambulatory.

## 2019-04-17 ENCOUNTER — Encounter: Payer: BC Managed Care – PPO | Admitting: Nurse Practitioner

## 2019-05-22 ENCOUNTER — Ambulatory Visit (HOSPITAL_COMMUNITY)
Admission: EM | Admit: 2019-05-22 | Discharge: 2019-05-22 | Disposition: A | Discharge status: Home / Self Care | Payer: Self-pay | Attending: Physician Assistant | Admitting: Physician Assistant

## 2019-05-22 ENCOUNTER — Encounter (HOSPITAL_COMMUNITY): Payer: Self-pay

## 2019-05-22 ENCOUNTER — Other Ambulatory Visit: Payer: Self-pay

## 2019-05-22 DIAGNOSIS — G8929 Other chronic pain: Secondary | ICD-10-CM

## 2019-05-22 DIAGNOSIS — M5442 Lumbago with sciatica, left side: Secondary | ICD-10-CM

## 2019-05-22 MED ORDER — METHYLPREDNISOLONE ACETATE 80 MG/ML IJ SUSP
INTRAMUSCULAR | Status: AC
  Filled 2019-05-22: qty 1

## 2019-05-22 MED ORDER — METHYLPREDNISOLONE ACETATE 80 MG/ML IJ SUSP
80.0000 mg | Freq: Once | INTRAMUSCULAR | Status: AC
  Administered 2019-05-22: 80 mg via INTRAMUSCULAR

## 2019-05-22 MED ORDER — IBUPROFEN 800 MG PO TABS: 800.0000 mg | ORAL_TABLET | Freq: Three times a day (TID) | ORAL | 0 refills | Status: DC | PRN

## 2019-05-22 MED ORDER — TIZANIDINE HCL 4 MG PO TABS: 4.0000 mg | ORAL_TABLET | Freq: Four times a day (QID) | ORAL | 0 refills | Status: DC | PRN

## 2019-05-22 NOTE — Discharge Instructions (Signed)
I want you to follow-up with the sports medicine office supplied.  I want you to start taking the ibuprofen 3 times a day tomorrow.  You may begin the tizanidine 1 to 2 tablets every 6 hours tonight.  This will make you sleepy so do not take this while at work or while driving.  You may also consider in following up with your primary care.

## 2019-05-22 NOTE — ED Triage Notes (Signed)
Pt present left leg pain symptoms started a few days ago.

## 2019-05-22 NOTE — ED Provider Notes (Signed)
[facility]    CSN: [id number] Arrival date & time: 05/22/19  1026      History   Chief Complaint Chief Complaint  Patient presents with  . Leg Pain    left leg    HPI Luis Lopez is a 44 y.o. male.   Patient presents urgent care for acute on chronic left-sided lower back pain with sciatica.  He reports for the last week he has had increasing left leg pain related to his chronic sciatica.  He reports shooting and burning pain down the back of his left leg that starts in his lower back.  He reports the symptoms are consistent previous presentations of his chronic lower back pain with sciatica.  He reports previously that receiving a steroid shot, ibuprofen and muscle relaxers help him significantly.  He reports he has attempted to establish with orthopedics but has not been able to do so due to insurance and Covid.  He does report 1 symptom change in that he has had some tingling in his little toe on the left foot.  Otherwise he denies numbness or tingling in the left leg.  Denies loss control bowel or bladder.  He was most recently seen for similar presentation in December 2020.  He received Depo-Medrol, was placed on ibuprofen and tizanidine.  Patient denies a history of diabetes or issue with his blood sugar.     Past Medical History:  Diagnosis Date  . Hypertension    borderline  . PVC (premature ventricular contraction) 04/29/2018  . Sleep apnea    uses a cpap  . Snoring     Patient Active Problem List   Diagnosis Date Noted  . Muscle spasm 07/26/2018  . Acute bilateral low back pain without sciatica 07/26/2018  . PVC (premature ventricular contraction) 04/29/2018  . Abnormal EKG 03/01/2018  . Essential hypertension 03/01/2018  . Umbilical hernia 123XX123  . Morbid obesity due to excess calories ([place]) 11/06/2015  . Nocturia more than twice per night 11/06/2015  . Sleep deprivation 11/06/2015  . Hypersomnia with sleep apnea 11/06/2015  . OSA  (obstructive sleep apnea) 11/06/2015    Past Surgical History:  Procedure Laterality Date  . INSERTION OF MESH N/A 11/03/2017   Procedure: INSERTION OF MESH;  Surgeon: Erroll Luna, MD;  Location: [facility];  Service: General;  Laterality: N/A;  . UMBILICAL HERNIA REPAIR  11/03/2017  . UMBILICAL HERNIA REPAIR N/A 11/03/2017   Procedure: UMBILICAL HERNIA REPAIR WITH MESH;  Surgeon: Erroll Luna, MD;  Location: [facility];  Service: General;  Laterality: N/A;       Home Medications    Prior to Admission medications   Medication Sig Start Date End Date Taking? Authorizing Provider  acetaminophen (TYLENOL) 500 MG tablet Take 1 tablet (500 mg total) by mouth every 6 (six) hours as needed. 01/01/19   Zigmund Gottron, NP  aspirin EC 81 MG tablet Take 81 mg by mouth daily.    [provider]  hydrochlorothiazide (HYDRODIURIL) 25 MG tablet Take 1 tablet (25 mg total) by mouth daily. PATIENT NEEDS AN APPOINTMENT 12/25/18   [name], FNP  ibuprofen (ADVIL) 800 MG tablet Take 1 tablet (800 mg total) by mouth every 8 (eight) hours as needed for moderate pain. 05/22/19   Fairy Ashlock, Marguerita Beards, PA-C  tiZANidine (ZANAFLEX) 4 MG tablet Take 1-2 tablets (4-8 mg total) by mouth every 6 (six) hours as needed for muscle spasms. 05/22/19   Nilam Quakenbush, Marguerita Beards, PA-C  UNABLE TO FIND CPAP: At  bedtime    [provider]    Family History Family History  Problem Relation Age of Onset  . Diabetes Father   . Diabetes Brother   . Diabetes Sister   . Aneurysm Paternal Grandmother        brain    Social History Social History   Tobacco Use  . Smoking status: Former Smoker    Quit date: 03/23/2003    Years since quitting: 16.1  . Smokeless tobacco: Never Used  Substance Use Topics  . Alcohol use: No  . Drug use: No     Allergies   Patient has no known allergies.   Review of Systems Review of Systems  Constitutional: Negative for fever.  Genitourinary: Negative for difficulty urinating.    Musculoskeletal: Positive for back pain and gait problem. Negative for neck pain and neck stiffness.  Neurological: Positive for numbness. Negative for tremors, weakness and headaches.  All other systems reviewed and are negative.    Physical Exam Triage Vital Signs ED Triage Vitals  Enc Vitals Group     BP 05/22/19 1111 127/85     Pulse Rate 05/22/19 1111 88     Resp 05/22/19 1111 16     Temp 05/22/19 1111 98.2 F (36.8 C)     Temp Source 05/22/19 1111 Oral     SpO2 05/22/19 1111 100 %     Weight --      Height --      Head Circumference --      Peak Flow --      Pain Score 05/22/19 1112 7     Pain Loc --      Pain Edu? --      Excl. in [place]? --    No data found.  Updated Vital Signs BP 127/85 (BP Location: Right Arm)   Pulse 88   Temp 98.2 F (36.8 C) (Oral)   Resp 16   SpO2 100%   Visual Acuity Right Eye Distance:   Left Eye Distance:   Bilateral Distance:    Right Eye Near:   Left Eye Near:    Bilateral Near:     Physical Exam Vitals and nursing note reviewed.  Constitutional:      General: He is not in acute distress.    Appearance: Normal appearance. He is well-developed.  HENT:     Head: Normocephalic and atraumatic.  Eyes:     Conjunctiva/sclera: Conjunctivae normal.  Cardiovascular:     Rate and Rhythm: Normal rate.  Pulmonary:     Effort: Pulmonary effort is normal. No respiratory distress.  Abdominal:     Tenderness: There is no abdominal tenderness.  Musculoskeletal:     Cervical back: Neck supple.     Comments: Straight leg raise is positive on the left.  Decreased range of motion with frontal flexion of the spine.  Patient stops due to pain on toe touch test at the knee.  Patient had symptom when reaching proper extension posture of the spine.  Tenderness to palpation over the left aspect of the lumbar spine in the L5-S1 region  Skin:    General: Skin is warm and dry.  Neurological:     Mental Status: He is alert and oriented to  person, place, and time.     Comments: Patient has diminished sensation on the lateral aspect of his left foot when compared to right.  Otherwise sensation is equal bilaterally  Patient has grossly equal strength bilaterally lower extremities.  UC Treatments / Results  Labs (all labs ordered are listed, but only abnormal results are displayed) Labs Reviewed - No data to display  EKG   Radiology No results found.  Procedures Procedures (including critical care time)  Medications Ordered in UC Medications  methylPREDNISolone acetate (DEPO-MEDROL) injection 80 mg (80 mg Intramuscular Given 05/22/19 1243)    Initial Impression / Assessment and Plan / UC Course  I have reviewed the triage vital signs and the nursing notes.  Pertinent labs & imaging results that were available during my care of the patient were reviewed by me and considered in my medical decision making (see chart for details).     #Chronic back pain with left-sided sciatica Patient is a 44 year old male presenting with acute flare of his chronic left-sided sciatica.  Patient does have progressive symptoms at this point with some paresthesia/numbness lateral aspect left foot.  Suspect that there is some nerve root involvement L5-S1.  No saddle paresthesias or alarm symptoms of incontinence or bowel control currently.  Given patient has had difficulty contacting and being seen by orthopedics did recommend that he follow-up with sports medicine or his primary care in order to prevent having to come to urgent care for symptom management. -Depo-Medrol 80 mg given in clinic -Start ibuprofen in her milligrams tomorrow every 8 hours for 3 days then as needed -Tizanidine to be taken primarily at night -Discussed posture and other methods to help alleviate pain given that he had pain relief with posture correction in exam.   Final Clinical Impressions(s) / UC Diagnoses   Final diagnoses:  Chronic left-sided low back  pain with left-sided sciatica     Discharge Instructions     I want you to follow-up with the sports medicine office supplied.  I want you to start taking the ibuprofen 3 times a day tomorrow.  You may begin the tizanidine 1 to 2 tablets every 6 hours tonight.  This will make you sleepy so do not take this while at work or while driving.  You may also consider in following up with your primary care.      ED Prescriptions    Medication Sig Dispense Auth. Provider   ibuprofen (ADVIL) 800 MG tablet Take 1 tablet (800 mg total) by mouth every 8 (eight) hours as needed for moderate pain. 90 tablet Luvinia Lucy, Marguerita Beards, PA-C   tiZANidine (ZANAFLEX) 4 MG tablet Take 1-2 tablets (4-8 mg total) by mouth every 6 (six) hours as needed for muscle spasms. 21 tablet Wojciech Willetts, Marguerita Beards, PA-C     PDMP not reviewed this encounter.   Purnell Shoemaker, PA-C 05/22/19 2227

## 2019-05-24 ENCOUNTER — Encounter (HOSPITAL_COMMUNITY): Payer: Self-pay | Admitting: Emergency Medicine

## 2019-05-24 ENCOUNTER — Emergency Department (HOSPITAL_COMMUNITY)
Admission: EM | Admit: 2019-05-24 | Discharge: 2019-05-24 | Disposition: A | Discharge status: Home / Self Care | Payer: Self-pay | Attending: Emergency Medicine | Admitting: Emergency Medicine

## 2019-05-24 ENCOUNTER — Other Ambulatory Visit: Payer: Self-pay

## 2019-05-24 DIAGNOSIS — M5442 Lumbago with sciatica, left side: Secondary | ICD-10-CM | POA: Insufficient documentation

## 2019-05-24 DIAGNOSIS — G8929 Other chronic pain: Secondary | ICD-10-CM | POA: Insufficient documentation

## 2019-05-24 DIAGNOSIS — Z7982 Long term (current) use of aspirin: Secondary | ICD-10-CM | POA: Insufficient documentation

## 2019-05-24 DIAGNOSIS — Z87891 Personal history of nicotine dependence: Secondary | ICD-10-CM | POA: Insufficient documentation

## 2019-05-24 DIAGNOSIS — Z79899 Other long term (current) drug therapy: Secondary | ICD-10-CM | POA: Insufficient documentation

## 2019-05-24 DIAGNOSIS — I10 Essential (primary) hypertension: Secondary | ICD-10-CM | POA: Insufficient documentation

## 2019-05-24 MED ORDER — LIDOCAINE 5 % EX PTCH
2.0000 | MEDICATED_PATCH | CUTANEOUS | Status: DC
  Administered 2019-05-24: 22:00:00 2 via TRANSDERMAL
  Filled 2019-05-24: qty 2

## 2019-05-24 MED ORDER — NAPROXEN 500 MG PO TABS: 500.0000 mg | ORAL_TABLET | Freq: Two times a day (BID) | ORAL | 0 refills | Status: DC

## 2019-05-24 MED ORDER — KETOROLAC TROMETHAMINE 30 MG/ML IJ SOLN
30.0000 mg | Freq: Once | INTRAMUSCULAR | Status: AC
  Administered 2019-05-24: 30 mg via INTRAMUSCULAR
  Filled 2019-05-24: qty 1

## 2019-05-24 NOTE — Discharge Instructions (Signed)
As discussed, your symptoms are most likely related to sciatica.  Continue taking the muscle relaxer from urgent care as prescribed.  I am sending you home with a medication called naproxen.  You can take it twice a day as needed for pain.  Do not mix it with other over-the-counter medications except for Tylenol.  You may also purchase Voltaren gel and Lidoderm patches for further pain management. I have included low back exercises as well. Return to the ER for new or worsening symptoms.

## 2019-05-24 NOTE — ED Provider Notes (Signed)
[facility] DEPT Provider Note   CSN: [id number] Arrival date & time: 05/24/19  2015     History Chief Complaint  Patient presents with  . Leg Pain    Luis Lopez is a 44 y.o. male with a past medical history significant for hypertension, PVCs, and sleep apnea on CPAP who presents to the ED due to worsening left low back pain that radiates down the posterior aspect of his left leg x6 months.  Patient has a history of left-sided sciatica.  Chart reviewed.  Patient was seen at urgent care on 05/22/2019 for the same complaint and was given a steroid injection, ibuprofen, and a muscle relaxer which patient has been compliant with.  Patient denies back injury.  He admits to mild numbness/tingling in his left fifth toe x 1 week, but denies further numbness/tingling of the rest of his left lower extremity.  Patient denies saddle paresthesias, bowel/bladder incontinence, IV drug use, history of cancer, fever, chills, and lower extremity weakness. No abdominal pain. Denies urinary symptoms. Pain is alleviated when leaning forward and worse with any movement. Patient currently has an orthopedic appointment on Tuesday.     Past Medical History:  Diagnosis Date  . Hypertension    borderline  . PVC (premature ventricular contraction) 04/29/2018  . Sleep apnea    uses a cpap  . Snoring     Patient Active Problem List   Diagnosis Date Noted  . Muscle spasm 07/26/2018  . Acute bilateral low back pain without sciatica 07/26/2018  . PVC (premature ventricular contraction) 04/29/2018  . Abnormal EKG 03/01/2018  . Essential hypertension 03/01/2018  . Umbilical hernia [id number]  . Morbid obesity due to excess calories ([name]) 11/06/2015  . Nocturia more than twice per night 11/06/2015  . Sleep deprivation 11/06/2015  . Hypersomnia with sleep apnea 11/06/2015  . OSA (obstructive sleep apnea) 11/06/2015    Past Surgical History:  Procedure Laterality Date  . INSERTION OF  MESH N/A 11/03/2017   Procedure: INSERTION OF MESH;  Surgeon: Erroll Luna, MD;  Location: [facility];  Service: General;  Laterality: N/A;  . UMBILICAL HERNIA REPAIR  11/03/2017  . UMBILICAL HERNIA REPAIR N/A 11/03/2017   Procedure: UMBILICAL HERNIA REPAIR WITH MESH;  Surgeon: Erroll Luna, MD;  Location: [facility];  Service: General;  Laterality: N/A;       Family History  Problem Relation Age of Onset  . Diabetes Father   . Diabetes Brother   . Diabetes Sister   . Aneurysm Paternal Grandmother        brain    Social History   Tobacco Use  . Smoking status: Former Smoker    Quit date: 03/23/2003    Years since quitting: 16.1  . Smokeless tobacco: Never Used  Substance Use Topics  . Alcohol use: No  . Drug use: No    Home Medications Prior to Admission medications   Medication Sig Start Date End Date Taking? Authorizing Provider  acetaminophen (TYLENOL) 500 MG tablet Take 1 tablet (500 mg total) by mouth every 6 (six) hours as needed. 01/01/19   Zigmund Gottron, NP  aspirin EC 81 MG tablet Take 81 mg by mouth daily.    [provider]  hydrochlorothiazide (HYDRODIURIL) 25 MG tablet Take 1 tablet (25 mg total) by mouth daily. PATIENT NEEDS AN APPOINTMENT 12/25/18   [name], FNP  ibuprofen (ADVIL) 800 MG tablet Take 1 tablet (800 mg total) by mouth every 8 (eight) hours as needed for moderate  pain. 05/22/19   Darr, Marguerita Beards, PA-C  naproxen (NAPROSYN) 500 MG tablet Take 1 tablet (500 mg total) by mouth 2 (two) times daily. 05/24/19   Suzy Bouchard, PA-C  tiZANidine (ZANAFLEX) 4 MG tablet Take 1-2 tablets (4-8 mg total) by mouth every 6 (six) hours as needed for muscle spasms. 05/22/19   Darr, Marguerita Beards, PA-C  UNABLE TO FIND CPAP: At bedtime    [provider]    Allergies    Patient has no known allergies.  Review of Systems   Review of Systems  Constitutional: Negative for chills and fever.  Respiratory: Negative for shortness of breath.   Cardiovascular:  Negative for chest pain.  Gastrointestinal: Negative for abdominal pain, diarrhea, nausea and vomiting.  Genitourinary: Negative for dysuria.  Musculoskeletal: Positive for back pain. Negative for gait problem.  Neurological: Positive for numbness.  All other systems reviewed and are negative.   Physical Exam Updated Vital Signs BP (!) 175/99   Pulse 84   Temp 98.8 F (37.1 C) (Oral)   Resp 16   SpO2 100%   Physical Exam Vitals and nursing note reviewed.  Constitutional:      General: He is not in acute distress.    Appearance: He is not ill-appearing.  HENT:     Head: Normocephalic.  Eyes:     Conjunctiva/sclera: Conjunctivae normal.  Cardiovascular:     Rate and Rhythm: Normal rate and regular rhythm.     Pulses: Normal pulses.     Heart sounds: Normal heart sounds. No murmur. No friction rub. No gallop.   Pulmonary:     Effort: Pulmonary effort is normal.     Breath sounds: Normal breath sounds.  Abdominal:     General: Abdomen is flat. Bowel sounds are normal. There is no distension.     Palpations: Abdomen is soft.     Tenderness: There is no abdominal tenderness. There is no guarding or rebound.  Musculoskeletal:     Cervical back: Neck supple.     Comments: No T-spine and L-spine midline tenderness, no stepoff or deformity, reproducible left lumbar paraspinal tenderness Positive left straight leg test No leg edema bilaterally Patient moves all extremities without difficulty. DP/PT pulses 2+ and equal bilaterally Sensation grossly intact bilaterally Strength of knee flexion and extension is 5/5 Plantar and dorsiflexion of ankle 5/5 Able to ambulate without difficulty   Skin:    General: Skin is warm and dry.  Neurological:     General: No focal deficit present.     Mental Status: He is alert.  Psychiatric:        Mood and Affect: Mood normal.        Behavior: Behavior normal.     ED Results / Procedures / Treatments   Labs (all labs ordered are  listed, but only abnormal results are displayed) Labs Reviewed - No data to display  EKG None  Radiology No results found.  Procedures Procedures (including critical care time)  Medications Ordered in ED Medications  lidocaine (LIDODERM) 5 % 2 patch (has no administration in time range)  ketorolac (TORADOL) 30 MG/ML injection 30 mg (has no administration in time range)    ED Course  I have reviewed the triage vital signs and the nursing notes.  Pertinent labs & imaging results that were available during my care of the patient were reviewed by me and considered in my medical decision making (see chart for details).    MDM Rules/Calculators/A&P  44 year old male presents to the ED due to acute on chronic left low back pain that radiates down the posterior aspect of his left leg.  Stable vitals.  Patient is afebrile, not tachycardic or hypoxic.  Patient no acute distress and non-ill-appearing.  Reproducible left lumbar paraspinal tenderness.  No midline thoracic or lumbar tenderness. History without red flags (cancer, IVDU, weakness, saddle anesthesia, trauma, weight loss) and physical exam most consistent with lumbar radiculopathy. Doubt cauda equina or disc herniation due to lack of saddle anesthesia/bowel or bladder incontinence or urinary retention, normal gait and reassuring physical examination without neurologic deficits.   Broad differential for back pain considered includes malignancy, disc herniation, spinal epidural abscess, spinal fracture, cauda equina, pyelonephritis, kidney stone, AAA, AD, pancreatitis, PE and PTX.   History is not supportive of kidney stone, AAA, AD, pancreatitis, PE or PTX. Patient has no CVA tenderness or urinary symptoms to suggest pyelonephritis or kidney stone.   Will manage patient conservatively at this time. NSAIDs, back exercises/stretches, heat therapy and follow up with orthopedic on Tuesday for further evaluation. Instructed  patient to continue taking muscle relaxer from UC as prescribed.Strict ED precautions discussed with patient. Patient states understanding and agrees to plan. Patient discharged home in no acute distress and stable vitals  Final Clinical Impression(s) / ED Diagnoses Final diagnoses:  Chronic left-sided low back pain with left-sided sciatica    Rx / DC Orders ED Discharge Orders         Ordered    naproxen (NAPROSYN) 500 MG tablet  2 times daily     05/24/19 2112           Karie Kirks 05/24/19 2112    Milton Ferguson, MD 05/25/19 1153

## 2019-05-24 NOTE — ED Triage Notes (Signed)
Patient reports left leg pain radiating down leg. Reports hx sciatica. Reports pain worsens with movement. Seen at [facility] yesterday.

## 2019-06-07 ENCOUNTER — Other Ambulatory Visit: Payer: Self-pay | Admitting: Orthopedic Surgery

## 2019-06-07 DIAGNOSIS — M545 Low back pain, unspecified: Secondary | ICD-10-CM

## 2019-06-12 ENCOUNTER — Ambulatory Visit
Admission: RE | Admit: 2019-06-12 | Discharge: 2019-06-12 | Disposition: A | Discharge status: Home / Self Care | Payer: BLUE CROSS/BLUE SHIELD | Source: Ambulatory Visit | Attending: Orthopedic Surgery | Admitting: Orthopedic Surgery

## 2019-06-12 ENCOUNTER — Other Ambulatory Visit: Payer: Self-pay

## 2019-06-12 DIAGNOSIS — M545 Low back pain, unspecified: Secondary | ICD-10-CM

## 2019-06-12 IMAGING — MR MR LUMBAR SPINE W/O CM
4 of 5 series · 23 of 48 positions shown · non-contrast
Comparison: None.

CLINICAL DATA: Low back and left leg pain

EXAM:
MRI LUMBAR SPINE WITHOUT CONTRAST
TECHNIQUE: Multiplanar, multisequence MR imaging of the lumbar spine was
performed. No intravenous contrast was administered.

[Series 6: T2 · sagittal · 4.0mm · 0.73mm/px · 2 of 8 slices shown (1 of 2)]
[im 1/8]
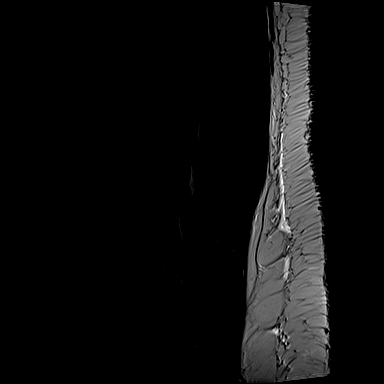
[im 8/8]
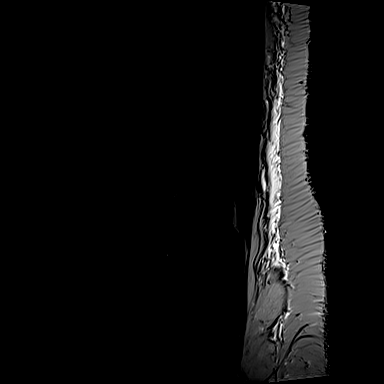

[Series 7: T1 · sagittal · 4.0mm · 0.88mm/px · 6 of 15 slices shown (1 of 2)]
[im 1/15]
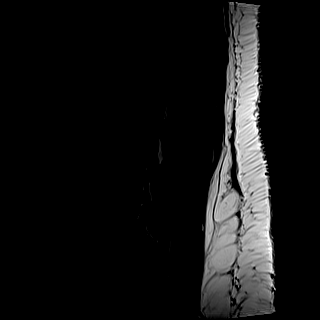
[im 3/15]
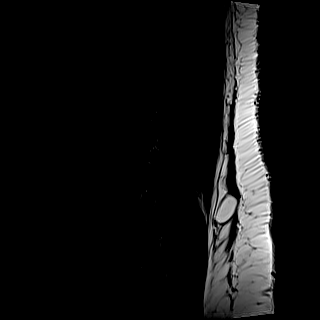
[im 6/15]
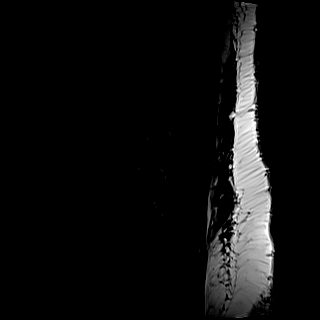
[im 9/15]
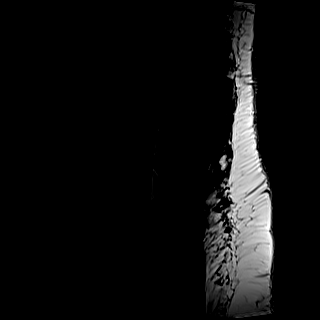
[im 12/15]
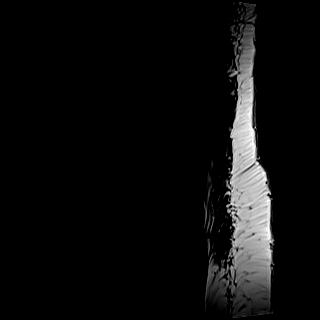
[im 15/15]
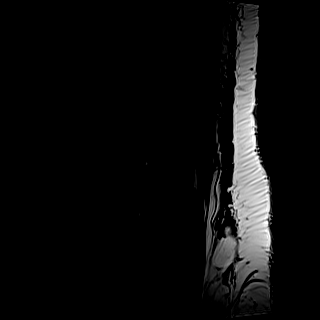

[Series 11: T1 · axial · 4.0mm · 0.35mm/px · z∈[+13,+194]mm · 4 of 42 slices shown (2 of 2)]
[im 3/42]
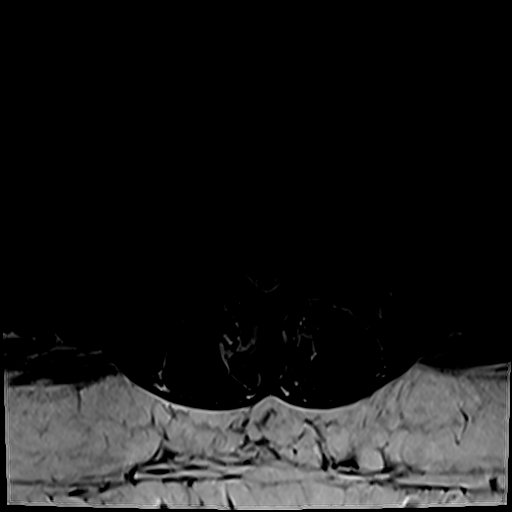
[im 6/42]
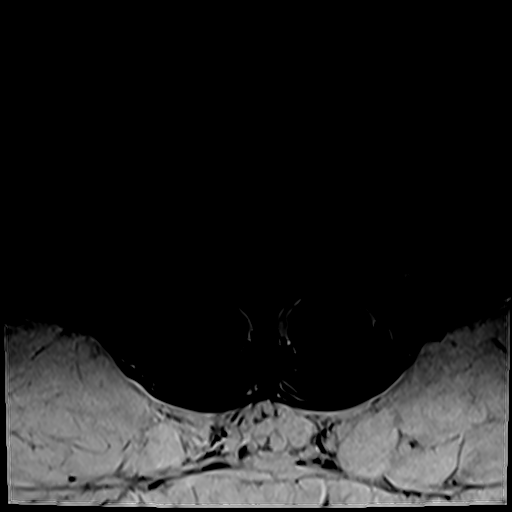
[im 21/42]
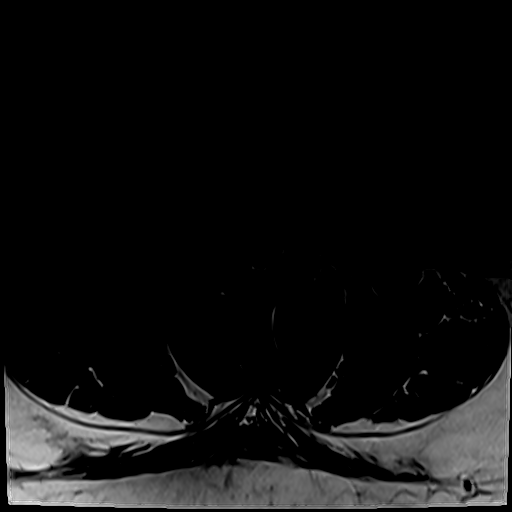
[im 36/42]
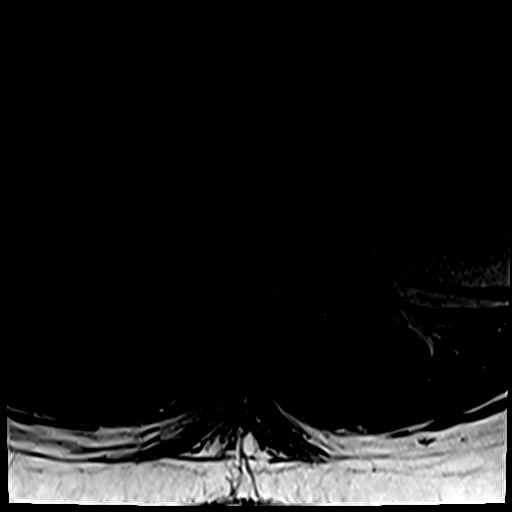

[Series 14: T2 · axial · 4.0mm · 0.35mm/px · z∈[+13,+209]mm · 11 of 42 slices shown (2 of 2)]
[im 3/42]
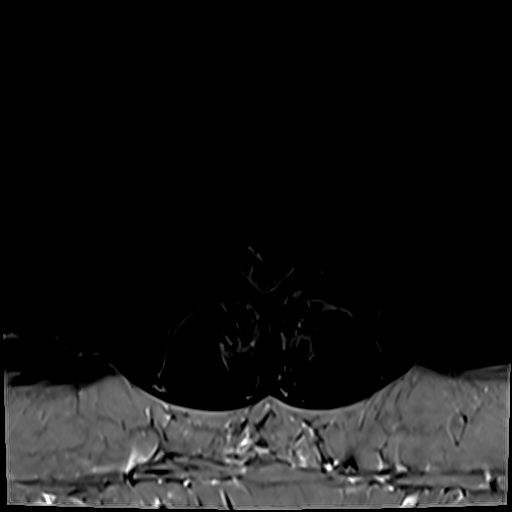
[im 6/42]
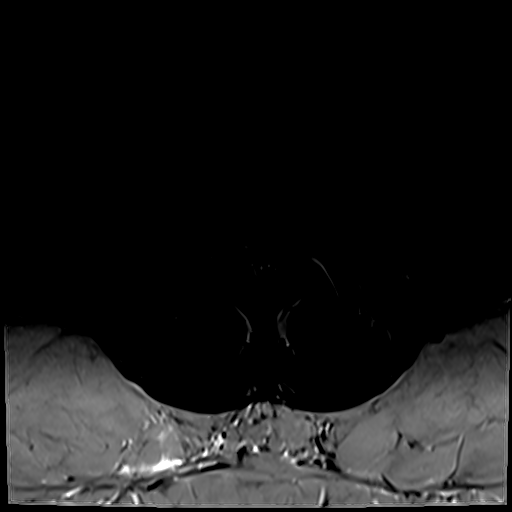
[im 8/42]
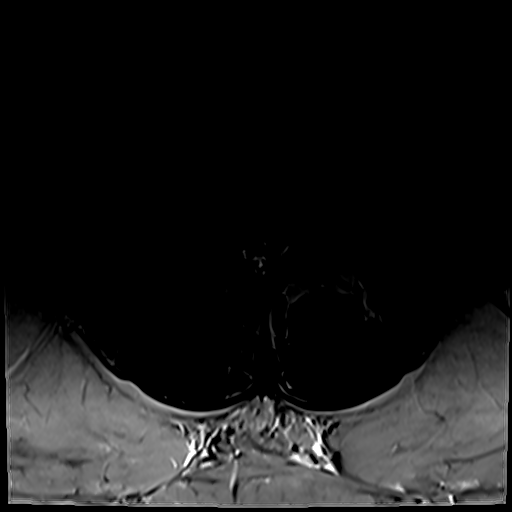
[im 13/42]
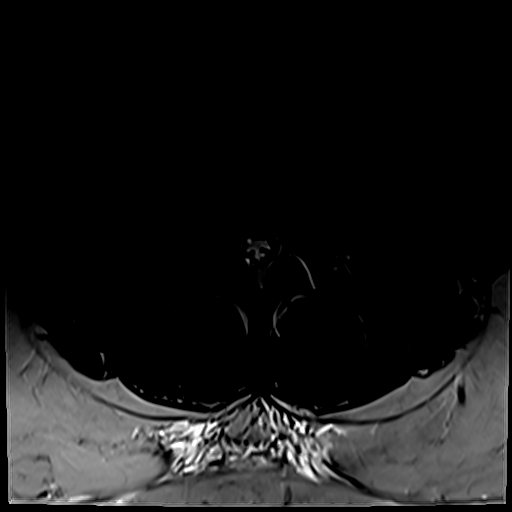
[im 18/42]
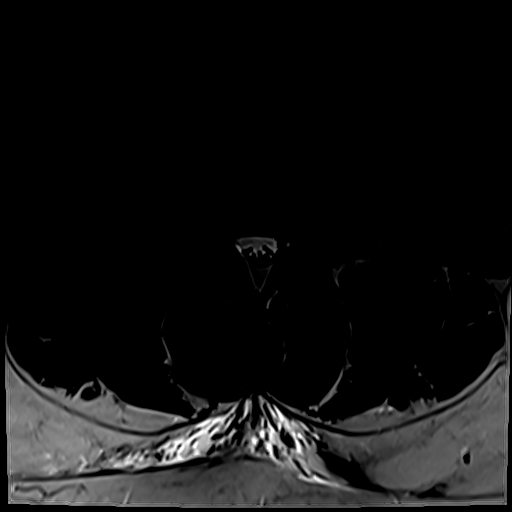
[im 21/42]
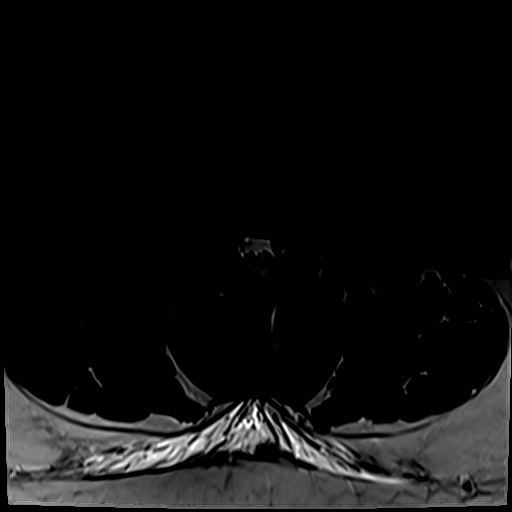
[im 24/42]
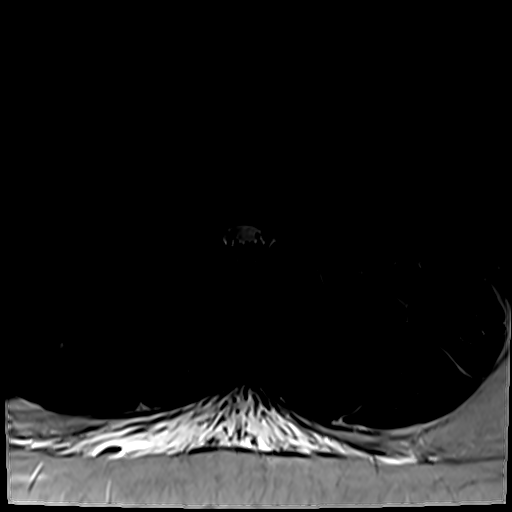
[im 29/42]
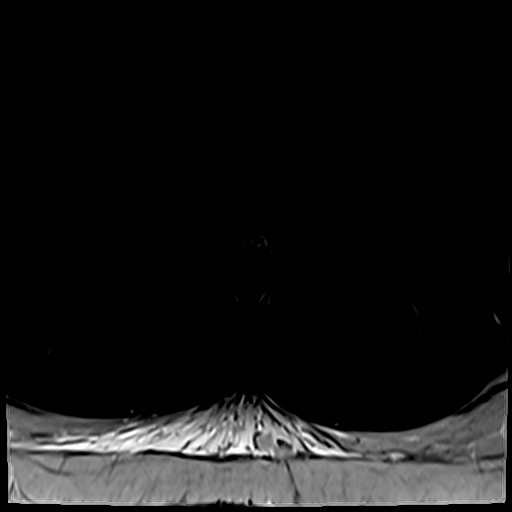
[im 34/42]
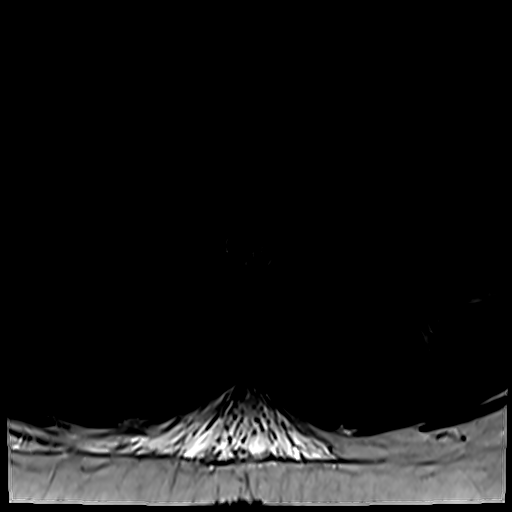
[im 36/42]
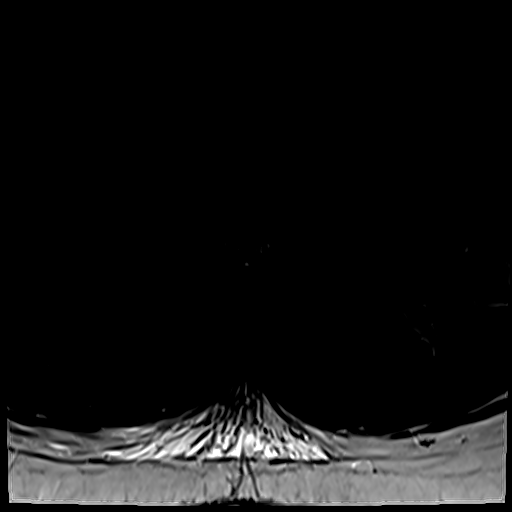
[im 39/42]
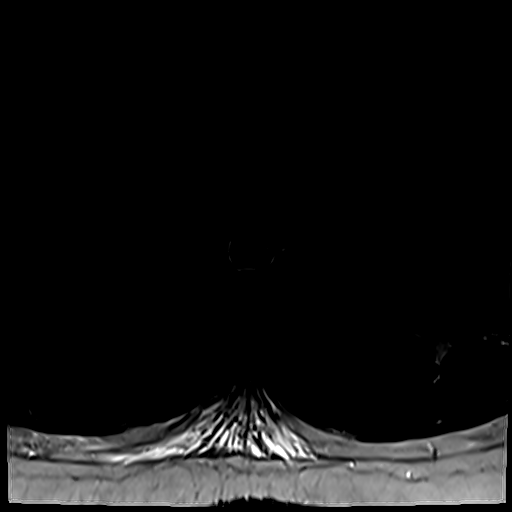

[23 of 48 positions shown; findings below may reference images not displayed]

FINDINGS: Segmentation:  Standard.

Alignment: Straightening of the lumbar lordosis. Anteroposterior
alignment is maintained.

Vertebrae: Vertebral body heights are preserved. A small hemangioma
is present within the L4 vertebral body. No substantial marrow edema
or suspicious osseous lesion.

Conus medullaris and cauda equina: Conus extends to the L1-L2 level.
Conus and cauda equina appear normal.

Paraspinal and other soft tissues: Unremarkable.

Disc levels:

L1-L2:  Prominent dorsal epidural fat.  No significant stenosis.

L2-L3:  Prominent dorsal epidural fat.  No significant stenosis.

L3-L4:  Prominent dorsal epidural fat.  No significant stenosis.

L4-L5:  Prominent dorsal epidural fat.  No significant stenosis.

L5-S1: Disc desiccation and disc height loss. Left
central/subarticular disc protrusion and circumferential epidural
fat. Partial effacement of the thecal sac secondary to the fat.
Probable compression of the traversing left S1 nerve root by the
disc protrusion. No foraminal stenosis.
IMPRESSION: Disc protrusion at L5-S1 compresses the traversing left S1 nerve
root.

## 2019-06-23 ENCOUNTER — Other Ambulatory Visit: Payer: Self-pay | Admitting: Nurse Practitioner

## 2019-06-23 DIAGNOSIS — I10 Essential (primary) hypertension: Secondary | ICD-10-CM

## 2019-07-22 ENCOUNTER — Other Ambulatory Visit: Payer: Self-pay | Admitting: Nurse Practitioner

## 2019-07-22 DIAGNOSIS — I10 Essential (primary) hypertension: Secondary | ICD-10-CM

## 2019-09-26 ENCOUNTER — Other Ambulatory Visit: Payer: Self-pay

## 2019-09-26 ENCOUNTER — Encounter: Payer: Self-pay | Admitting: Nurse Practitioner

## 2019-09-26 ENCOUNTER — Ambulatory Visit (INDEPENDENT_AMBULATORY_CARE_PROVIDER_SITE_OTHER): Payer: Self-pay | Admitting: Nurse Practitioner

## 2019-09-26 VITALS — BP 170/102 | HR 77 | Temp 97.5°F | Ht 72.2 in | Wt 358.2 lb

## 2019-09-26 DIAGNOSIS — R2232 Localized swelling, mass and lump, left upper limb: Secondary | ICD-10-CM

## 2019-09-26 DIAGNOSIS — I10 Essential (primary) hypertension: Secondary | ICD-10-CM

## 2019-09-26 MED ORDER — AMLODIPINE BESYLATE 2.5 MG PO TABS: 2.5000 mg | ORAL_TABLET | Freq: Every day | ORAL | 2 refills | Status: DC

## 2019-09-26 MED ORDER — HYDROCHLOROTHIAZIDE 25 MG PO TABS: ORAL_TABLET | ORAL | 1 refills | Status: DC

## 2019-09-26 NOTE — Progress Notes (Signed)
This visit occurred during the SARS-CoV-2 public health emergency.  Safety protocols were in place, including screening questions prior to the visit, additional usage of staff PPE, and extensive cleaning of exam room while observing appropriate contact time as indicated for disinfecting solutions.  Subjective:     Patient ID: Luis Lopez , male    DOB: 1975-11-10 , 44 y.o.   MRN: [id number]   Chief Complaint  Patient presents with  . Mass  . Hypertension    HPI  He is here today with a "bump" on his left arm for the last 3 months. Denies itching, burning or change in shape. Denies fever or chills.  He had been going back and forth to the orthopedic for his back pain.    Wt Readings from Last 3 Encounters: 09/26/19 : (!) 358 lb 3.2 oz (162.5 kg) 03/25/19 : (!) 348 lb 5.2 oz (158 kg) 02/07/19 : (!) 348 lb 9.6 oz (158.1 kg)   Hypertension This is a chronic problem. The current episode started more than 1 year ago. The problem has been gradually worsening since onset. The problem is uncontrolled. Pertinent negatives include no anxiety, chest pain, headaches, malaise/fatigue or palpitations. Risk factors for coronary artery disease include obesity and sedentary lifestyle. Treatments tried: Has been without his medications since April.  There are no compliance problems.  There is no history of angina. There is no history of chronic renal disease.     Past Medical History:  Diagnosis Date  . Hypertension    borderline  . PVC (premature ventricular contraction) 04/29/2018  . Sleep apnea    uses a cpap  . Snoring      Family History  Problem Relation Age of Onset  . Diabetes Father   . Diabetes Brother   . Diabetes Sister   . Aneurysm Paternal Grandmother        brain     Current Outpatient Medications:  .  acetaminophen (TYLENOL) 500 MG tablet, Take 1 tablet (500 mg total) by mouth every 6 (six) hours as needed., Disp: 30 tablet, Rfl: 0 .  aspirin EC 81 MG tablet, Take 81 mg by  mouth daily., Disp: , Rfl:  .  hydrochlorothiazide (HYDRODIURIL) 25 MG tablet, One tablet by mouth daily. Needs appt for further refills., Disp: 30 tablet, Rfl: 0 .  UNABLE TO FIND, CPAP: At bedtime, Disp: , Rfl:  .  ibuprofen (ADVIL) 800 MG tablet, Take 1 tablet (800 mg total) by mouth every 8 (eight) hours as needed for moderate pain. (Patient not taking: Reported on 09/25/2019), Disp: 90 tablet, Rfl: 0   No Known Allergies   Review of Systems  Constitutional: Negative.  Negative for fatigue and malaise/fatigue.  Respiratory: Negative.  Negative for cough.   Cardiovascular: Negative for chest pain, palpitations and leg swelling.  Neurological: Negative for dizziness and headaches.  Psychiatric/Behavioral: Negative.      Today's Vitals   09/26/19 0933 09/26/19 0935  BP: (!) 172/100 (!) 170/102  Pulse: 77   Temp: (!) 97.5 F (36.4 C)   TempSrc: Oral   Weight: (!) 358 lb 3.2 oz (162.5 kg)   Height: 6' 0.2" (1.834 m)   PainSc: 0-No pain    Body mass index is 48.31 kg/m.   Objective:  Physical Exam Vitals reviewed.  Constitutional:      General: He is not in acute distress.    Appearance: Normal appearance.  Cardiovascular:     Rate and Rhythm: Normal rate and regular rhythm.  Pulses: Normal pulses.     Heart sounds: Normal heart sounds. No murmur heard.   Pulmonary:     Effort: Pulmonary effort is normal. No respiratory distress.     Breath sounds: Normal breath sounds.  Skin:    General: Skin is warm and dry.     Capillary Refill: Capillary refill takes less than 2 seconds.     Findings: Lesion (left lateral arm with raised solid vesicle, nontender) present. No rash.  Neurological:     General: No focal deficit present.     Mental Status: He is alert and oriented to person, place, and time.     Cranial Nerves: No cranial nerve deficit.  Psychiatric:        Mood and Affect: Mood normal.        Behavior: Behavior normal.        Thought Content: Thought content  normal.        Judgment: Judgment normal.         Assessment And Plan:     .1. Essential hypertension  Chronic, blood pressure is elevated, he will be restarted on his HCTZ and will start him on low dose 2.5 mg.   He is also encouraged to increase his physical activity.  - BMP8+eGFR - amLODipine (NORVASC) 2.5 MG tablet; Take 1 tablet (2.5 mg total) by mouth daily.  Dispense: 30 tablet; Refill: 2 - hydrochlorothiazide (HYDRODIURIL) 25 MG tablet; One tablet by mouth daily.  Dispense: 90 tablet; Refill: 1  2. Lump of skin of left upper extremity  Firm raised area to his left lateral forearm  I will refer to dermatology for further evaluation - Ambulatory referral to Dermatology  3. Morbid obesity due to excess calories ([place])  Has gained 10 lbs since January.   Encouraged to increase his physical activity.   Will check his Hepatitis c at his physical        [name], FNP    THE PATIENT IS ENCOURAGED TO PRACTICE SOCIAL DISTANCING DUE TO THE COVID-19 PANDEMIC.

## 2019-09-27 ENCOUNTER — Telehealth: Payer: Self-pay

## 2019-09-27 LAB — BMP8+EGFR
BUN/Creatinine Ratio: 14 (ref 9–20)
BUN: 12 mg/dL (ref 6–24)
CO2: 25 mmol/L (ref 20–29)
Calcium: 9.2 mg/dL (ref 8.7–10.2)
Chloride: 102 mmol/L (ref 96–106)
Creatinine, Ser: 0.84 mg/dL (ref 0.76–1.27)
GFR calc Af Amer: 125 mL/min/{1.73_m2} (ref >59)
GFR calc non Af Amer: 108 mL/min/{1.73_m2} (ref >59)
Glucose: 145 mg/dL — ABNORMAL HIGH (ref 65–99)
Potassium: 4 mmol/L (ref 3.5–5.2)
Sodium: 141 mmol/L (ref 134–144)

## 2019-09-27 NOTE — Telephone Encounter (Signed)
Left vm for pt to return call. Want to offer pt lab appt for a1c to be checked.

## 2019-10-01 ENCOUNTER — Other Ambulatory Visit: Payer: Self-pay

## 2019-10-01 DIAGNOSIS — R739 Hyperglycemia, unspecified: Secondary | ICD-10-CM

## 2019-10-02 ENCOUNTER — Other Ambulatory Visit: Payer: Self-pay

## 2019-10-18 ENCOUNTER — Telehealth: Payer: Self-pay

## 2019-10-18 NOTE — Telephone Encounter (Signed)
I called patient and notified him that he did not come back and get his bloodwork done pt stated he is in the process of getting new ins and it will be effective in Aug. I advised him we will cancel the order per JM and told him to call us to schedule an appointment once his ins is active. YL,RMA

## 2020-05-24 ENCOUNTER — Emergency Department (HOSPITAL_COMMUNITY): Payer: 59

## 2020-05-24 ENCOUNTER — Other Ambulatory Visit: Payer: Self-pay

## 2020-05-24 ENCOUNTER — Emergency Department (HOSPITAL_COMMUNITY)
Admission: EM | Admit: 2020-05-24 | Discharge: 2020-05-24 | Disposition: A | Discharge status: Home / Self Care | Payer: 59 | Attending: Emergency Medicine | Admitting: Emergency Medicine

## 2020-05-24 ENCOUNTER — Encounter (HOSPITAL_COMMUNITY): Payer: Self-pay | Admitting: Emergency Medicine

## 2020-05-24 DIAGNOSIS — Y990 Civilian activity done for income or pay: Secondary | ICD-10-CM | POA: Diagnosis not present

## 2020-05-24 DIAGNOSIS — Z87891 Personal history of nicotine dependence: Secondary | ICD-10-CM | POA: Diagnosis not present

## 2020-05-24 DIAGNOSIS — Z7982 Long term (current) use of aspirin: Secondary | ICD-10-CM | POA: Diagnosis not present

## 2020-05-24 DIAGNOSIS — S4991XA Unspecified injury of right shoulder and upper arm, initial encounter: Secondary | ICD-10-CM | POA: Diagnosis not present

## 2020-05-24 DIAGNOSIS — M25511 Pain in right shoulder: Secondary | ICD-10-CM

## 2020-05-24 DIAGNOSIS — I10 Essential (primary) hypertension: Secondary | ICD-10-CM | POA: Insufficient documentation

## 2020-05-24 DIAGNOSIS — X500XXA Overexertion from strenuous movement or load, initial encounter: Secondary | ICD-10-CM | POA: Diagnosis not present

## 2020-05-24 DIAGNOSIS — Z79899 Other long term (current) drug therapy: Secondary | ICD-10-CM | POA: Insufficient documentation

## 2020-05-24 DIAGNOSIS — R030 Elevated blood-pressure reading, without diagnosis of hypertension: Secondary | ICD-10-CM

## 2020-05-24 IMAGING — DX DG SHOULDER 2+V*R*
4 series · 4 of 4 positions shown · non-contrast
Comparison: None.

CLINICAL DATA: Right shoulder pain.  Injury.

EXAM:
RIGHT SHOULDER - 2+ VIEW

[shoulder grashey]
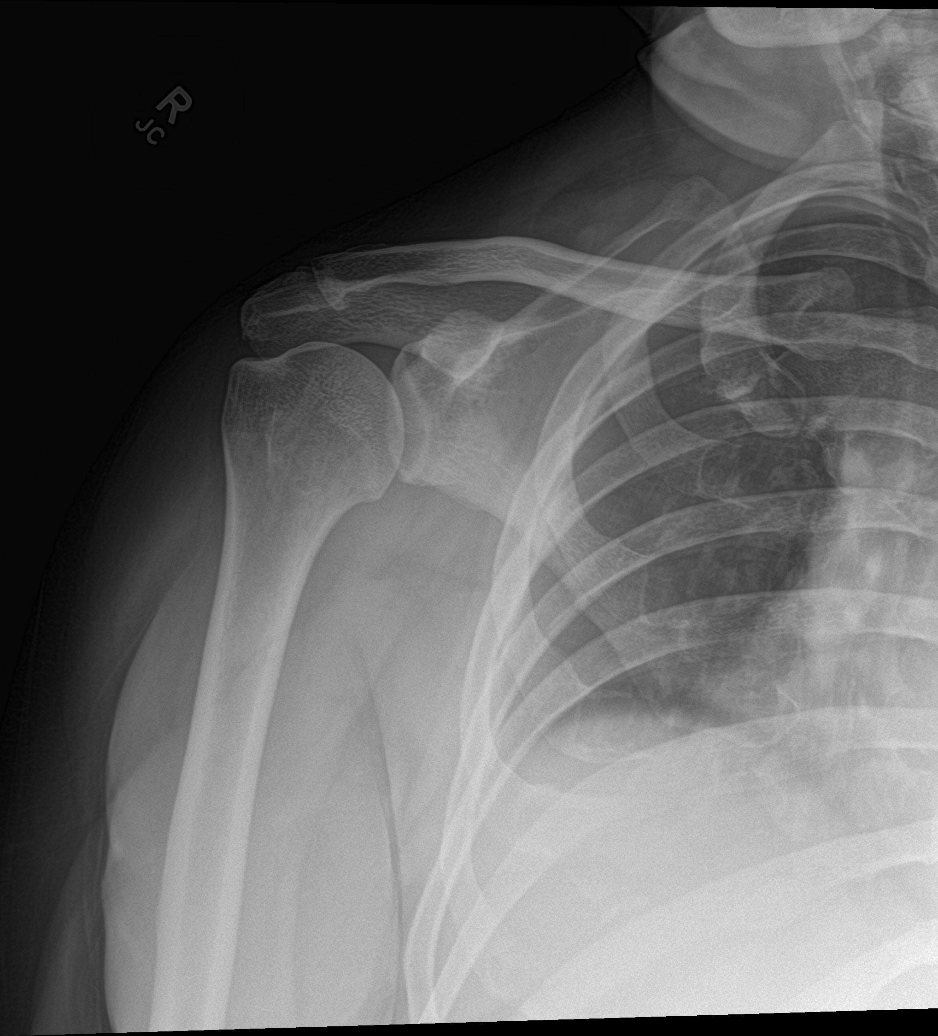

[shoulder y view]
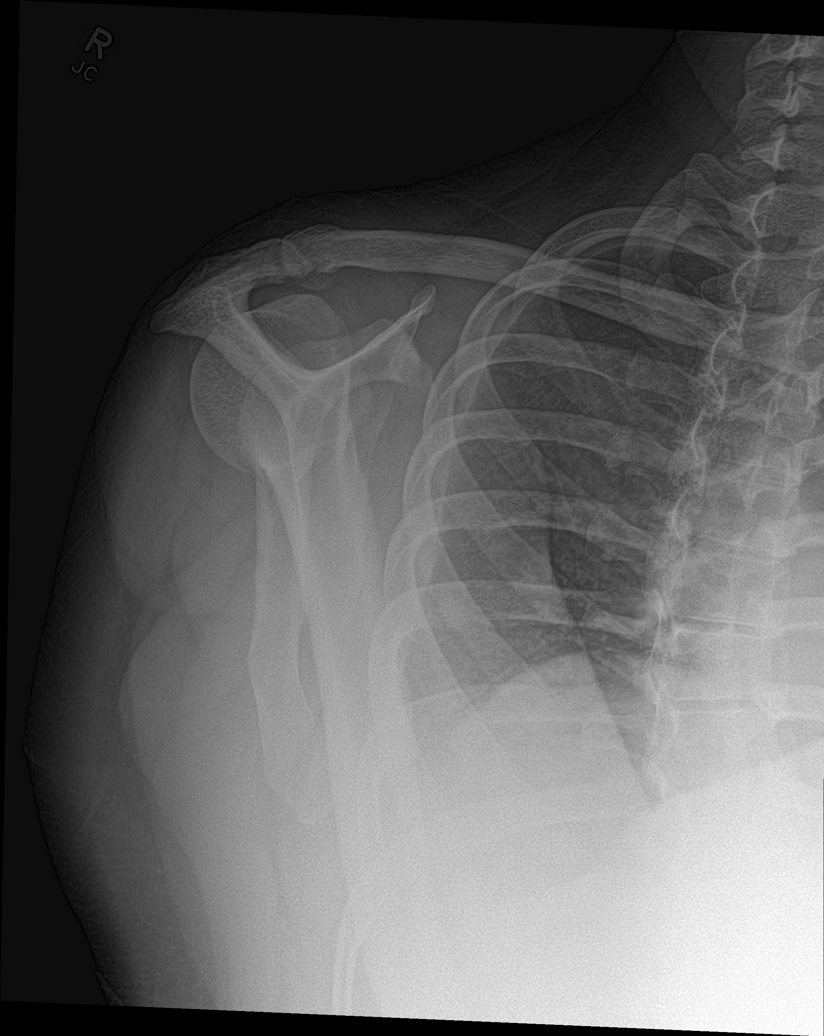

[shoulder axillary]
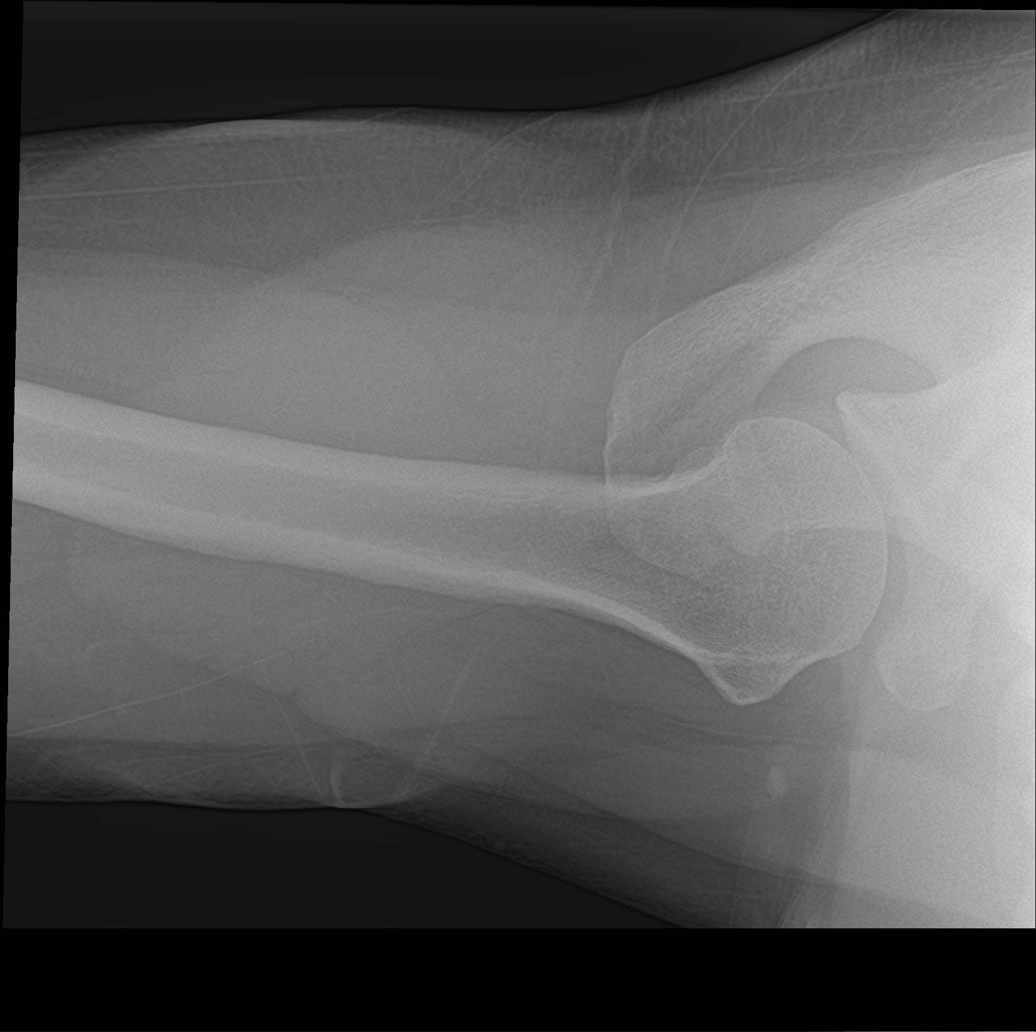

[shoulder ap neutral]
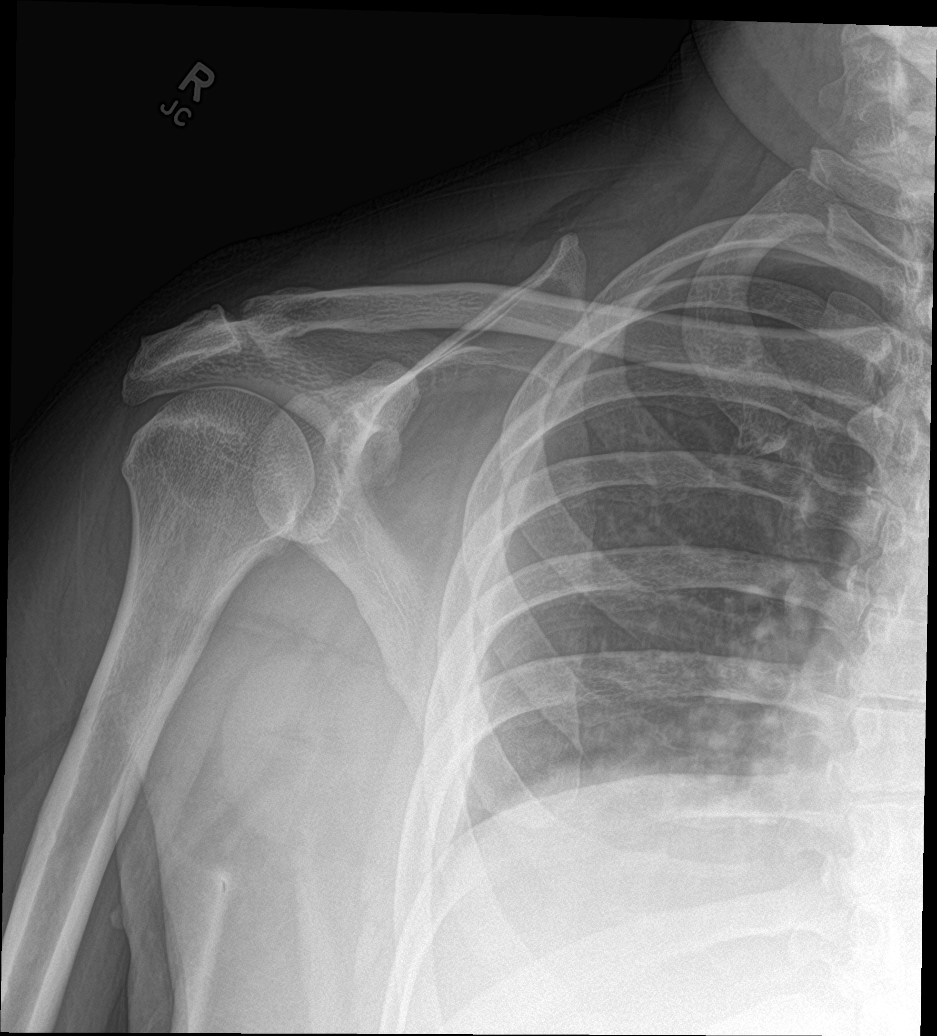

[4 of 4 positions shown; findings below may reference images not displayed]

FINDINGS: There is no evidence of fracture or dislocation. Normal glenohumeral
alignment. Trace inferior acromioclavicular spurring. Soft tissues
are unremarkable.
IMPRESSION: No fracture or dislocation of the right shoulder.

## 2020-05-24 NOTE — ED Provider Notes (Signed)
[facility] EMERGENCY DEPARTMENT Provider Note   CSN: [id number] Arrival date & time: 05/24/20  1823     History Chief Complaint  Patient presents with  . Shoulder Pain    Luis Lopez is a 45 y.o. male history of obesity, hypertension, sleep apnea.  Patient presents today for evaluation of right shoulder injury.  Patient reports 1 month ago he was playing basketball and while lifting his arm up to make his shot he developed right anterior shoulder pain that was worse with movements above shoulder level.  This lasted for a few days before slowly resolving.  Patient reports that yesterday while at work he was picking up a mini fridge and having to lift above shoulder level when the shoulder pain suddenly returned.  He describes sharp anterior right shoulder pain worse with palpation and movements above shoulder level improves with rest, pain does not radiate and is currently mild in intensity.  Denies fall/injury, fever/chills, chest pain/shortness of breath, cough/hemoptysis, numbness/tingling, weakness, neck pain, back pain or any additional concerns  HPI     Past Medical History:  Diagnosis Date  . Hypertension    borderline  . PVC (premature ventricular contraction) 04/29/2018  . Sleep apnea    uses a cpap  . Snoring     Patient Active Problem List   Diagnosis Date Noted  . Muscle spasm 07/26/2018  . Acute bilateral low back pain without sciatica 07/26/2018  . PVC (premature ventricular contraction) 04/29/2018  . Abnormal EKG 03/01/2018  . Essential hypertension 03/01/2018  . Umbilical hernia 40/98/1191  . Morbid obesity due to excess calories ([place]) 11/06/2015  . Nocturia more than twice per night 11/06/2015  . Sleep deprivation 11/06/2015  . Hypersomnia with sleep apnea 11/06/2015  . OSA (obstructive sleep apnea) 11/06/2015    Past Surgical History:  Procedure Laterality Date  . INSERTION OF MESH N/A 11/03/2017   Procedure: INSERTION OF MESH;   Surgeon: Erroll Luna, MD;  Location: [facility];  Service: General;  Laterality: N/A;  . UMBILICAL HERNIA REPAIR  11/03/2017  . UMBILICAL HERNIA REPAIR N/A 11/03/2017   Procedure: UMBILICAL HERNIA REPAIR WITH MESH;  Surgeon: Erroll Luna, MD;  Location: [facility];  Service: General;  Laterality: N/A;       Family History  Problem Relation Age of Onset  . Diabetes Father   . Diabetes Brother   . Diabetes Sister   . Aneurysm Paternal Grandmother        brain    Social History   Tobacco Use  . Smoking status: Former Smoker    Quit date: 03/23/2003    Years since quitting: 17.1  . Smokeless tobacco: Never Used  Vaping Use  . Vaping Use: Never used  Substance Use Topics  . Alcohol use: No  . Drug use: No    Home Medications Prior to Admission medications   Medication Sig Start Date End Date Taking? Authorizing Provider  acetaminophen (TYLENOL) 500 MG tablet Take 1 tablet (500 mg total) by mouth every 6 (six) hours as needed. 01/01/19   Augusto Gamble B, NP  amLODipine (NORVASC) 2.5 MG tablet Take 1 tablet (2.5 mg total) by mouth daily. 09/26/19 09/25/20  [name], FNP  aspirin EC 81 MG tablet Take 81 mg by mouth daily.    [provider]  hydrochlorothiazide (HYDRODIURIL) 25 MG tablet One tablet by mouth daily. 09/26/19   [name], FNP  ibuprofen (ADVIL) 800 MG tablet Take 1 tablet (800 mg total) by mouth every  8 (eight) hours as needed for moderate pain. Patient not taking: Reported on 09/25/2019 05/22/19   Darr, Edison Nasuti, PA-C  UNABLE TO FIND CPAP: At bedtime    [provider]    Allergies    Patient has no known allergies.  Review of Systems   Review of Systems  Constitutional: Negative.  Negative for chills and fever.  Respiratory: Negative.  Negative for cough and shortness of breath.   Cardiovascular: Negative.  Negative for chest pain.  Musculoskeletal: Positive for arthralgias (Right shoulder). Negative for back pain, joint swelling and neck pain.   Neurological: Negative.  Negative for weakness and numbness.    Physical Exam Updated Vital Signs BP (!) 148/100 (BP Location: Left Arm)   Pulse 83   Temp 98 F (36.7 C) (Oral)   Resp 18   SpO2 99%   Physical Exam Constitutional:      General: He is not in acute distress.    Appearance: Normal appearance. He is well-developed. He is obese. He is not ill-appearing or diaphoretic.  HENT:     Head: Normocephalic and atraumatic.  Eyes:     General: Vision grossly intact. Gaze aligned appropriately.     Pupils: Pupils are equal, round, and reactive to light.  Neck:     Trachea: Trachea and phonation normal.  Cardiovascular:     Rate and Rhythm: Normal rate and regular rhythm.     Pulses:          Radial pulses are 2+ on the right side and 2+ on the left side.  Pulmonary:     Effort: Pulmonary effort is normal. No respiratory distress.  Abdominal:     General: There is no distension.     Palpations: Abdomen is soft.     Tenderness: There is no abdominal tenderness. There is no guarding or rebound.  Musculoskeletal:        General: Normal range of motion.       Arms:     Cervical back: Normal range of motion.     Comments: Cervical Spine: Appearance normal. No obvious bony deformity. No skin swelling, erythema, heat, fluctuance or break of the skin. No TTP over the cervical spinous processes. No paraspinal tenderness. No step-offs. Patient is able to actively rotate their neck 45 degrees left and right voluntarily without pain and flex and extend the neck without pain. ----------------- Righ Shoulder: Appearance normal. No obvious bony deformity. No skin swelling, erythema, heat, fluctuance or break of the skin. No clavicular deformity or TTP.  Point tenderness over anterior deltoid.  Active and passive range of motion intact below shoulder level.  Increased pain with flexion and external rotation.  Increased pain with all movements above shoulder level.  Positive empty can  test. ----------  Right Elbow: Appearance normal. No obvious bony deformity. No skin swelling, erythema, heat, fluctuance or break of the skin. No TTP over joint. Active flexion, extension, supination and pronation full and intact without pain. Strength able and appropriate for age for flexion and extension.  Radial Pulse 2+. Cap refill <2 seconds. SILT for M/U/R distributions. Compartments soft.   Skin:    General: Skin is warm and dry.  Neurological:     Mental Status: He is alert.     GCS: GCS eye subscore is 4. GCS verbal subscore is 5. GCS motor subscore is 6.     Comments: Speech is clear and goal oriented, follows commands Major Cranial nerves without deficit, no facial droop Moves extremities without  ataxia, coordination intact  Psychiatric:        Behavior: Behavior normal.     ED Results / Procedures / Treatments   Labs (all labs ordered are listed, but only abnormal results are displayed) Labs Reviewed - No data to display  EKG None  Radiology DG Shoulder Right  Result Date: 05/24/2020 CLINICAL DATA:  Right shoulder pain.  Injury. EXAM: RIGHT SHOULDER - 2+ VIEW COMPARISON:  None. FINDINGS: There is no evidence of fracture or dislocation. Normal glenohumeral alignment. Trace inferior acromioclavicular spurring. Soft tissues are unremarkable. IMPRESSION: No fracture or dislocation of the right shoulder. Electronically Signed   By: Keith Rake M.D.   On: 05/24/2020 19:39    Procedures Procedures   Medications Ordered in ED Medications - No data to display  ED Course  I have reviewed the triage vital signs and the nursing notes.  Pertinent labs & imaging results that were available during my care of the patient were reviewed by me and considered in my medical decision making (see chart for details).    MDM Rules/Calculators/A&P                         Additional history obtained from: 1. Nursing notes from this visit. ========= DG Right Shoulder:   IMPRESSION:  No fracture or dislocation of the right shoulder.   46 year old male presented for pain of the anterior right shoulder after trying to lift a refrigerator above his head, he had a previous injury of the same area after playing basketball 1 month ago.  On exam he is tender over the anterior deltoid pain increases with movements above shoulder level and he is a positive empty can test.  X-ray also shows some trace spurring at the inferior [facility] joint which is the spot that the patient is point tender.  Suspect this to be musculoskeletal in nature possible ligamentous/tendon or rotator cuff injury.  There is no evidence for septic arthritis, cellulitis, DVT, compartment syndrome, neurovascular compromise or other emergent causes of patient's pain today.  Additionally patient denies any chest pain shortness of breath or exertional symptoms doubt this to be atypical presentation of ACS or other cardiopulmonary cause.  Plan of care is to place patient in sling and referred to Ortho for further treatment discussed rice therapy and OTC anti-inflammatories.  Will give patient work note.  Incidentally patient noted to have elevated blood pressure reading today 148/100, this is improved from 2 most recent prior blood pressure readings in chart review.  I encourage patient take his blood pressure medication as prescribed by his PCP and have blood pressure rechecked by PCP this week and discuss medication management if indicated at that time.  Patient is asymptomatic regarding elevated blood pressure reading, patient informed signs/symptoms of hypertensive urgency/emergency and to return to the ER if they occur.   At this time there does not appear to be any evidence of an acute emergency medical condition and the patient appears stable for discharge with appropriate outpatient follow up. Diagnosis was discussed with patient who verbalizes understanding of care plan and is agreeable to discharge. I have  discussed return precautions with patient who verbalizes understanding. Patient encouraged to follow-up with their PCP and Ortho. All questions answered.  Note: Portions of this report may have been transcribed using voice recognition software. Every effort was made to ensure accuracy; however, inadvertent computerized transcription errors may still be present. Final Clinical Impression(s) / ED Diagnoses Final diagnoses:  Acute  pain of right shoulder  Elevated blood pressure reading    Rx / DC Orders ED Discharge Orders    None       Gari Crown 05/24/20 2244    Drenda Freeze, MD 05/25/20 1500

## 2020-05-24 NOTE — Discharge Instructions (Signed)
At this time there does not appear to be the presence of an emergent medical condition, however there is always the potential for conditions to change. Please read and follow the below instructions.  Please return to the Emergency Department immediately for any new or worsening symptoms. Please be sure to follow up with your Primary Care Provider within one week regarding your visit today; please call their office to schedule an appointment even if you are feeling better for a follow-up visit. Please use a sling to protect your shoulder from further injury, over the next few days as your pain improves you may practice gentle range of motion exercises to avoid stiffness as long as they do not cause pain.  Please call the orthopedic specialist Dr. Lucia Gaskins under discharge paperwork for further evaluation and treatment of your shoulder pain. Your blood pressure was elevated in the emergency department today.  Please take your blood pressure medication as prescribed by your primary care doctor and have your blood pressure rechecked by your primary care doctor within 1 week.  Go to the nearest Emergency Department immediately if: You have fever or chills Get a very bad headache. Start to feel mixed up (confused). Feel weak or numb. Feel faint. Have very bad pain in your: Chest. Belly (abdomen). Throw up more than once. Have trouble breathing. Your arm, hand, or fingers: Tingle. Are numb. Are swollen. Are painful. Turn white or blue. You have any new/concerning or worsening of symptoms.   Please read the additional information packets attached to your discharge summary.  Do not take your medicine if  develop an itchy rash, swelling in your mouth or lips, or difficulty breathing; call 911 and seek immediate emergency medical attention if this occurs.  You may review your lab tests and imaging results in their entirety on your MyChart account.  Please discuss all results of fully with your  primary care provider and other specialist at your follow-up visit.  Note: Portions of this text may have been transcribed using voice recognition software. Every effort was made to ensure accuracy; however, inadvertent computerized transcription errors may still be present.

## 2020-05-24 NOTE — ED Triage Notes (Signed)
C/o R shoulder pain since playing basketball 1 month ago.  States it got better but flared up again after picking a mini-fridge up at work yesterday.

## 2020-05-24 NOTE — Progress Notes (Signed)
Orthopedic Tech Progress Note Patient Details:  Luis Lopez Feb 10, 1976 [id number]  Ortho Devices Type of Ortho Device: Arm sling Ortho Device/Splint Location: Right Arm Ortho Device/Splint Interventions: Application   Post Interventions Patient Tolerated: Well   Luis Lopez 05/24/2020, 10:49 PM

## 2020-05-27 ENCOUNTER — Ambulatory Visit: Payer: Self-pay | Admitting: Nurse Practitioner

## 2020-06-10 ENCOUNTER — Encounter: Payer: Self-pay | Admitting: Orthopaedic Surgery

## 2020-06-10 ENCOUNTER — Ambulatory Visit (INDEPENDENT_AMBULATORY_CARE_PROVIDER_SITE_OTHER): Payer: 59 | Admitting: Orthopaedic Surgery

## 2020-06-10 DIAGNOSIS — M25511 Pain in right shoulder: Secondary | ICD-10-CM

## 2020-06-10 DIAGNOSIS — G8929 Other chronic pain: Secondary | ICD-10-CM | POA: Diagnosis not present

## 2020-06-10 MED ORDER — METHYLPREDNISOLONE ACETATE 40 MG/ML IJ SUSP
40.0000 mg | INTRAMUSCULAR | Status: AC | PRN
  Administered 2020-06-10: 40 mg via INTRA_ARTICULAR

## 2020-06-10 MED ORDER — LIDOCAINE HCL 2 % IJ SOLN
2.0000 mL | INTRAMUSCULAR | Status: AC | PRN
  Administered 2020-06-10: 2 mL

## 2020-06-10 MED ORDER — BUPIVACAINE HCL 0.25 % IJ SOLN
2.0000 mL | INTRAMUSCULAR | Status: AC | PRN
  Administered 2020-06-10: 2 mL via INTRA_ARTICULAR

## 2020-06-10 NOTE — Progress Notes (Signed)
Office Visit Note   Patient: Luis Lopez           Date of Birth: Feb 10, 1976           MRN: [id number] Visit Date: 06/10/2020              Requested by: [name], [address] [place],  [place] [address] PCP: [name], FNP   Assessment & Plan: Visit Diagnoses:  1. Chronic right shoulder pain     Plan: Impression is right shoulder rotator cuff tendinitis and questionable labral pathology.  At this point, recommended subacromial cortisone injection for which she would like to proceed.  He will call us over the next several weeks if the symptoms have not improved and we will order an MRI to assess for structural abnormalities.  Follow-Up Instructions: Return if symptoms worsen or fail to improve.   Orders:  Orders Placed This Encounter  Procedures  . Large Joint Inj: R subacromial bursa   No orders of the defined types were placed in this encounter.     Procedures: Large Joint Inj: R subacromial bursa on 06/10/2020 10:51 AM Indications: pain Details: 22 G needle Medications: 2 mL lidocaine 2 %; 2 mL bupivacaine 0.25 %; 40 mg methylPREDNISolone acetate 40 MG/ML Outcome: tolerated well, no immediate complications Patient was prepped and draped in the usual sterile fashion.       Clinical Data: No additional findings.   Subjective: Chief Complaint  Patient presents with  . Right Shoulder - Pain    HPI patient is a very pleasant 45 year old gentleman who comes in today with chronic right shoulder pain for the past 1/2 to 2 months.  Initially had increased pain while playing basketball which did subside with time.  His pain returned recently after trying to lift a mini fridge.  The pain is primarily to the anterior aspect and is described as a constant tooth ache.  He has associated weakness.  Pain is worse with forward flexion and external rotation.  He has been using Biofreeze without significant relief.  He denies any paresthesias.  No previous  cortisone injection to the right shoulder.  Review of Systems as detailed in HPI.  All others reviewed and are negative.   Objective: Vital Signs: There were no vitals taken for this visit.  Physical Exam well-developed well-nourished gentleman in no acute distress.  Alert oriented x3.  Ortho Exam right shoulder exam reveals forward flexion to about 160 degrees.  Abduction to about 90 degrees.  External rotation to about 45 degrees.  Positive empty can test.  Positive O'Brien's and speeds test.  Near full strength throughout.  He is neurovascular intact distally.  Specialty Comments:  No specialty comments available.  Imaging: X-rays reviewed by me in canopy are negative for acute findings.   PMFS History: Patient Active Problem List   Diagnosis Date Noted  . Muscle spasm 07/26/2018  . Acute bilateral low back pain without sciatica 07/26/2018  . PVC (premature ventricular contraction) 04/29/2018  . Abnormal EKG 03/01/2018  . Essential hypertension 03/01/2018  . Umbilical hernia 27/25/3664  . Morbid obesity due to excess calories ([place]) 11/06/2015  . Nocturia more than twice per night 11/06/2015  . Sleep deprivation 11/06/2015  . Hypersomnia with sleep apnea 11/06/2015  . OSA (obstructive sleep apnea) 11/06/2015   Past Medical History:  Diagnosis Date  . Hypertension    borderline  . PVC (premature ventricular contraction) 04/29/2018  . Sleep apnea    uses a  cpap  . Snoring     Family History  Problem Relation Age of Onset  . Diabetes Father   . Diabetes Brother   . Diabetes Sister   . Aneurysm Paternal Grandmother        brain    Past Surgical History:  Procedure Laterality Date  . INSERTION OF MESH N/A 11/03/2017   Procedure: INSERTION OF MESH;  Surgeon: Erroll Luna, MD;  Location: [facility];  Service: General;  Laterality: N/A;  . UMBILICAL HERNIA REPAIR  11/03/2017  . UMBILICAL HERNIA REPAIR N/A 11/03/2017   Procedure: UMBILICAL HERNIA REPAIR WITH MESH;   Surgeon: Erroll Luna, MD;  Location: [facility];  Service: General;  Laterality: N/A;   Social History   Occupational History  . Not on file  Tobacco Use  . Smoking status: Former Smoker    Quit date: 03/23/2003    Years since quitting: 17.2  . Smokeless tobacco: Never Used  Vaping Use  . Vaping Use: Never used  Substance and Sexual Activity  . Alcohol use: No  . Drug use: No  . Sexual activity: Yes    Birth control/protection: None

## 2020-06-16 ENCOUNTER — Other Ambulatory Visit: Payer: Self-pay | Admitting: Nurse Practitioner

## 2020-06-16 DIAGNOSIS — I10 Essential (primary) hypertension: Secondary | ICD-10-CM

## 2020-06-19 ENCOUNTER — Encounter: Payer: Self-pay | Admitting: Nurse Practitioner

## 2020-06-19 ENCOUNTER — Other Ambulatory Visit: Payer: Self-pay

## 2020-06-19 ENCOUNTER — Ambulatory Visit (INDEPENDENT_AMBULATORY_CARE_PROVIDER_SITE_OTHER): Payer: 59 | Admitting: Nurse Practitioner

## 2020-06-19 VITALS — BP 160/100 | HR 82 | Temp 97.9°F | Ht 72.2 in | Wt 332.6 lb

## 2020-06-19 DIAGNOSIS — Z0101 Encounter for examination of eyes and vision with abnormal findings: Secondary | ICD-10-CM

## 2020-06-19 DIAGNOSIS — G4733 Obstructive sleep apnea (adult) (pediatric): Secondary | ICD-10-CM | POA: Diagnosis not present

## 2020-06-19 DIAGNOSIS — I10 Essential (primary) hypertension: Secondary | ICD-10-CM

## 2020-06-19 DIAGNOSIS — Z9989 Dependence on other enabling machines and devices: Secondary | ICD-10-CM

## 2020-06-19 DIAGNOSIS — Z23 Encounter for immunization: Secondary | ICD-10-CM

## 2020-06-19 DIAGNOSIS — K5900 Constipation, unspecified: Secondary | ICD-10-CM

## 2020-06-19 DIAGNOSIS — Z1159 Encounter for screening for other viral diseases: Secondary | ICD-10-CM

## 2020-06-19 DIAGNOSIS — Z6841 Body Mass Index (BMI) 40.0 and over, adult: Secondary | ICD-10-CM

## 2020-06-19 DIAGNOSIS — R7309 Other abnormal glucose: Secondary | ICD-10-CM

## 2020-06-19 MED ORDER — LOSARTAN POTASSIUM 25 MG PO TABS: 25.0000 mg | ORAL_TABLET | Freq: Every day | ORAL | 2 refills | Status: DC

## 2020-06-19 NOTE — Progress Notes (Signed)
Rutherford Nail as a scribe for [name], FNP.,have documented all relevant documentation on the behalf of [name], FNP,as directed by  [name], FNP while in the presence of [name], [place]. This visit occurred during the SARS-CoV-2 public health emergency.  Safety protocols were in place, including screening questions prior to the visit, additional usage of staff PPE, and extensive cleaning of exam room while observing appropriate contact time as indicated for disinfecting solutions.  Subjective:     Patient ID: Luis Lopez , male    DOB: [birth year] , 45 y.o.   MRN: [id number]   Chief Complaint  Patient presents with  . Hypertension    HPI  Pt is here today for b/p check.  The pt said that he stopped taking the amlodipine because it was making him feel bad and that he couldn't function at work.  He took the medication for about a week.  He reports when he took amlodipine he felt drained and not wanting to go to work. He stopped this medication in July 2021.  Did not call the office to inform how the medication made him feel.   Wt Readings from Last 3 Encounters: 06/19/20 : (!) 332 lb 9.6 oz (150.9 kg) 09/26/19 : (!) 358 lb 3.2 oz (162.5 kg) 03/25/19 : (!) 348 lb 5.2 oz (158 kg)   Hypertension This is a chronic problem. The current episode started more than 1 year ago. The problem has been gradually worsening since onset. The problem is uncontrolled. Pertinent negatives include no anxiety, chest pain, headaches, malaise/fatigue or palpitations. Risk factors for coronary artery disease include obesity and sedentary lifestyle. Treatments tried: Has been without his medications since April.  There are no compliance problems.  There is no history of angina. There is no history of chronic renal disease.     Past Medical History:  Diagnosis Date  . Hypertension    borderline  . PVC (premature ventricular contraction) 04/29/2018  . Sleep apnea    uses a cpap  .  Snoring      Family History  Problem Relation Age of Onset  . Diabetes Father   . Diabetes Brother   . Diabetes Sister   . Aneurysm Paternal Grandmother        brain     Current Outpatient Medications:  .  acetaminophen (TYLENOL) 500 MG tablet, Take 1 tablet (500 mg total) by mouth every 6 (six) hours as needed., Disp: 30 tablet, Rfl: 0 .  aspirin EC 81 MG tablet, Take 81 mg by mouth daily., Disp: , Rfl:  .  hydrochlorothiazide (HYDRODIURIL) 25 MG tablet, TAKE 1 TABLET BY MOUTH DAILY. Pt must keep upcoming appointment for further refills, Disp: 30 tablet, Rfl: 1 .  losartan (COZAAR) 25 MG tablet, Take 1 tablet (25 mg total) by mouth daily., Disp: 30 tablet, Rfl: 2 .  UNABLE TO FIND, CPAP: At bedtime, Disp: , Rfl:  .  ibuprofen (ADVIL) 800 MG tablet, Take 1 tablet (800 mg total) by mouth every 8 (eight) hours as needed for moderate pain. (Patient not taking: No sig reported), Disp: 90 tablet, Rfl: 0   No Known Allergies   Review of Systems  Constitutional: Negative.  Negative for fatigue and malaise/fatigue.  HENT: Negative.   Respiratory: Negative.   Cardiovascular: Negative for chest pain, palpitations and leg swelling.  Endocrine: Negative for polydipsia, polyphagia and polyuria.  Musculoskeletal: Negative.   Skin: Negative.   Neurological: Negative for dizziness and headaches.  Psychiatric/Behavioral: Negative.  Today's Vitals   06/19/20 1005  BP: (!) 160/100  Pulse: 82  Temp: 97.9 F (36.6 C)  TempSrc: Oral  Weight: (!) 332 lb 9.6 oz (150.9 kg)  Height: 6' 0.2" (1.834 m)   Body mass index is 44.86 kg/m.  Wt Readings from Last 3 Encounters:  06/19/20 (!) 332 lb 9.6 oz (150.9 kg)  09/26/19 (!) 358 lb 3.2 oz (162.5 kg)  03/25/19 (!) 348 lb 5.2 oz (158 kg)   Objective:  Physical Exam Vitals reviewed.  Constitutional:      General: He is not in acute distress.    Appearance: Normal appearance.  Cardiovascular:     Rate and Rhythm: Normal rate and regular  rhythm.     Pulses: Normal pulses.     Heart sounds: Normal heart sounds. No murmur heard.   Pulmonary:     Effort: Pulmonary effort is normal. No respiratory distress.     Breath sounds: Normal breath sounds. No wheezing.  Skin:    Capillary Refill: Capillary refill takes less than 2 seconds.  Neurological:     General: No focal deficit present.     Mental Status: He is alert and oriented to person, place, and time.     Cranial Nerves: No cranial nerve deficit.  Psychiatric:        Mood and Affect: Mood normal.        Behavior: Behavior normal.        Thought Content: Thought content normal.        Judgment: Judgment normal.         Assessment And Plan:     1. Uncontrolled hypertension  Chronic, he had been without his blood pressure medication since about June 2021 because of how the medication made him feel. I will also refer him to the eye doctor as well - losartan (COZAAR) 25 MG tablet; Take 1 tablet (25 mg total) by mouth daily.  Dispense: 30 tablet; Refill: 2 - BMP8+eGFR - Ambulatory referral to Ophthalmology  2. OSA (obstructive sleep apnea)  Chronic, encouraged to continue to using his CPAP nightly, he is just getting started  3. Class 3 severe obesity due to excess calories with serious comorbidity and body mass index (BMI) of 40.0 to 44.9 in adult [facility])  Chronic  Discussed healthy diet and regular exercise options   Encouraged to exercise at least 150 minutes per week with 2 days of strength training  He is encouraged to initially strive for BMI less than 30 to decrease cardiac risk.  4. CPAP (continuous positive airway pressure) dependence - Ambulatory referral to [facility] [place]  5. Abnormal glucose  Chronic, controlled  No current medications  Encouraged to limit intake of sugary foods and drinks - Hemoglobin A1c  6. Constipation, unspecified constipation type I have encouraged him to increase his water and  fiber intake.  I have also given him a fiber supplement to take daily  7. Encounter for hepatitis C screening test for low risk patient  Will check Hepatitis C screening due to recent recommendations to screen all adults 18 years and older - Hepatitis C antibody  8. Need for influenza vaccination  Influenza vaccine administered  Encouraged to take Tylenol as needed for fever or muscle aches. - Flu Vaccine QUAD 6+ mos PF IM (Fluarix Quad PF)  9. Encounter for vision examination with abnormal findings - Ambulatory referral to Ophthalmology      Patient was given opportunity to ask questions. Patient verbalized  understanding of the plan and was able to repeat key elements of the plan. All questions were answered to their satisfaction.  [name], FNP   I, [name], FNP, have reviewed all documentation for this visit. The documentation on 06/25/20 for the exam, diagnosis, procedures, and orders are all accurate and complete.   IF YOU HAVE BEEN REFERRED TO A SPECIALIST, IT MAY TAKE 1-2 WEEKS TO SCHEDULE/PROCESS THE REFERRAL. IF YOU HAVE NOT HEARD FROM US/SPECIALIST IN TWO WEEKS, PLEASE GIVE [place] A CALL AT [phone].   THE PATIENT IS ENCOURAGED TO PRACTICE SOCIAL DISTANCING DUE TO THE COVID-19 PANDEMIC.

## 2020-06-19 NOTE — Patient Instructions (Signed)

## 2020-06-20 LAB — BMP8+EGFR
BUN/Creatinine Ratio: 16 (ref 9–20)
BUN: 12 mg/dL (ref 6–24)
CO2: 22 mmol/L (ref 20–29)
Calcium: 9.1 mg/dL (ref 8.7–10.2)
Chloride: 98 mmol/L (ref 96–106)
Creatinine, Ser: 0.77 mg/dL (ref 0.76–1.27)
Glucose: 287 mg/dL — ABNORMAL HIGH (ref 65–99)
Potassium: 3.9 mmol/L (ref 3.5–5.2)
Sodium: 139 mmol/L (ref 134–144)
eGFR: 114 mL/min/{1.73_m2} (ref >59)

## 2020-06-20 LAB — HEMOGLOBIN A1C
Est. average glucose Bld gHb Est-mCnc: 283 mg/dL
Hgb A1c MFr Bld: 11.5 % — ABNORMAL HIGH (ref 4.8–5.6)

## 2020-06-20 LAB — HEPATITIS C ANTIBODY: Hep C Virus Ab: <0.1 s/co ratio (ref 0.0–0.9)

## 2020-06-25 MED ORDER — OZEMPIC (0.25 OR 0.5 MG/DOSE) 2 MG/1.5ML ~~LOC~~ SOPN: 0.5000 mg | PEN_INJECTOR | SUBCUTANEOUS | 1 refills | Status: DC

## 2020-06-25 MED ORDER — METFORMIN HCL 500 MG PO TABS: 500.0000 mg | ORAL_TABLET | Freq: Two times a day (BID) | ORAL | 2 refills | Status: DC

## 2020-07-10 ENCOUNTER — Other Ambulatory Visit: Payer: Self-pay

## 2020-07-10 ENCOUNTER — Encounter: Payer: Self-pay | Admitting: Nurse Practitioner

## 2020-07-10 MED ORDER — OZEMPIC (0.25 OR 0.5 MG/DOSE) 2 MG/1.5ML ~~LOC~~ SOPN: 0.5000 mg | PEN_INJECTOR | SUBCUTANEOUS | 1 refills | Status: DC

## 2020-07-14 LAB — HM DIABETES EYE EXAM

## 2020-07-17 ENCOUNTER — Other Ambulatory Visit: Payer: Self-pay

## 2020-07-17 ENCOUNTER — Ambulatory Visit (INDEPENDENT_AMBULATORY_CARE_PROVIDER_SITE_OTHER): Payer: 59 | Admitting: Nurse Practitioner

## 2020-07-17 ENCOUNTER — Encounter: Payer: Self-pay | Admitting: Nurse Practitioner

## 2020-07-17 VITALS — BP 138/80 | HR 78 | Temp 98.0°F | Ht 72.2 in | Wt 325.8 lb

## 2020-07-17 DIAGNOSIS — E66813 Obesity, class 3: Secondary | ICD-10-CM

## 2020-07-17 DIAGNOSIS — E1169 Type 2 diabetes mellitus with other specified complication: Secondary | ICD-10-CM

## 2020-07-17 DIAGNOSIS — I10 Essential (primary) hypertension: Secondary | ICD-10-CM | POA: Diagnosis not present

## 2020-07-17 DIAGNOSIS — Z6841 Body Mass Index (BMI) 40.0 and over, adult: Secondary | ICD-10-CM

## 2020-07-17 NOTE — Patient Instructions (Signed)

## 2020-07-17 NOTE — Progress Notes (Signed)
I,Yamilka Roman [employer] as a Education administrator for [employer], FNP.,have documented all relevant documentation on the behalf of [name], FNP,as directed by  [name], FNP while in the presence of [name], [place].  This visit occurred during the SARS-CoV-2 public health emergency.  Safety protocols were in place, including screening questions prior to the visit, additional usage of staff PPE, and extensive cleaning of exam room while observing appropriate contact time as indicated for disinfecting solutions.  Subjective:     Patient ID: Luis Lopez , male    DOB: 1975/09/17 , 45 y.o.   MRN: [id number]   Chief Complaint  Patient presents with  . Hypertension  . Diabetes    HPI  Pt is here today for b/p check.  He is also here for Ozempic follow up, tolerating well. Denies nausea. Had some mild constipation but is taking a fiber supplement which has helped. He can tell he has a decreased appetite since being on Ozempic.  At home his blood pressure has been 122/74.    Wt Readings from Last 3 Encounters: 07/17/20 : (!) 325 lb 12.8 oz (147.8 kg) 06/19/20 : (!) 332 lb 9.6 oz (150.9 kg) 09/26/19 : (!) 358 lb 3.2 oz (162.5 kg)      Hypertension This is a chronic problem. The current episode started more than 1 year ago. The problem has been gradually worsening since onset. The problem is uncontrolled. Pertinent negatives include no anxiety, chest pain, headaches, malaise/fatigue or palpitations. Risk factors for coronary artery disease include obesity and sedentary lifestyle. Treatments tried: Has been without his medications since April.  There are no compliance problems.  There is no history of angina. There is no history of chronic renal disease.     Past Medical History:  Diagnosis Date  . Hypertension    borderline  . PVC (premature ventricular contraction) 04/29/2018  . Sleep apnea    uses a cpap  . Snoring      Family History  Problem Relation Age of Onset  . Diabetes  Father   . Diabetes Brother   . Diabetes Sister   . Aneurysm Paternal Grandmother        brain     Current Outpatient Medications:  .  aspirin EC 81 MG tablet, Take 81 mg by mouth daily., Disp: , Rfl:  .  hydrochlorothiazide (HYDRODIURIL) 25 MG tablet, TAKE 1 TABLET BY MOUTH DAILY. Pt must keep upcoming appointment for further refills, Disp: 30 tablet, Rfl: 1 .  losartan (COZAAR) 25 MG tablet, Take 1 tablet (25 mg total) by mouth daily., Disp: 30 tablet, Rfl: 2 .  metFORMIN (GLUCOPHAGE) 500 MG tablet, Take 1 tablet (500 mg total) by mouth 2 (two) times daily with a meal., Disp: 60 tablet, Rfl: 2 .  Semaglutide,0.25 or 0.5MG /DOS, (OZEMPIC, 0.25 OR 0.5 MG/DOSE,) 2 MG/1.5ML SOPN, Inject 0.5 mg into the skin once a week., Disp: 4.5 mL, Rfl: 1 .  UNABLE TO FIND, CPAP: At bedtime, Disp: , Rfl:    No Known Allergies   Review of Systems  Constitutional: Negative.  Negative for malaise/fatigue.  Respiratory: Negative.   Cardiovascular: Negative.  Negative for chest pain and palpitations.  Gastrointestinal: Negative.   Neurological: Negative.  Negative for headaches.  Psychiatric/Behavioral: Negative.      Today's Vitals   07/17/20 0928  BP: 138/80  Pulse: 78  Temp: 98 F (36.7 C)  TempSrc: Oral  Weight: (!) 325 lb 12.8 oz (147.8 kg)  Height: 6' 0.2" (1.834 m)  Body mass index is 43.94 kg/m.   Objective:  Physical Exam Vitals reviewed.  Constitutional:      General: He is not in acute distress.    Appearance: Normal appearance. He is obese.  Cardiovascular:     Rate and Rhythm: Normal rate and regular rhythm.     Pulses: Normal pulses.     Heart sounds: Normal heart sounds. No murmur heard.   Pulmonary:     Effort: Pulmonary effort is normal. No respiratory distress.     Breath sounds: Normal breath sounds. No wheezing.  Skin:    Capillary Refill: Capillary refill takes less than 2 seconds.  Neurological:     General: No focal deficit present.     Mental Status: He  is alert and oriented to person, place, and time.     Cranial Nerves: No cranial nerve deficit.  Psychiatric:        Mood and Affect: Mood normal.        Behavior: Behavior normal.        Thought Content: Thought content normal.        Judgment: Judgment normal.         Assessment And Plan:     1. Primary hypertension  Chronic, blood pressure is improving and he is tolerating losartan well   2. Type 2 diabetes mellitus with other specified complication, without long-term current use of insulin ([place])  This is a new diagnosis his HgbA1c was 11.5 at his last visit, he was started on Ozempic and is currently taking 0.25 mg which he is tolerating well. Denies any nausea or difficulty with swallowing.   3. Class 3 severe obesity due to excess calories with serious comorbidity and body mass index (BMI) of 40.0 to 44.9 in adult [facility])  Chronic  Discussed healthy diet and regular exercise options   Encouraged to exercise at least 150 minutes per week with 2 days of strength training  He has lost 6 lbs since starting Ozempic as a side effec    Patient was given opportunity to ask questions. Patient verbalized understanding of the plan and was able to repeat key elements of the plan. All questions were answered to their satisfaction.  [name], FNP     I, [name], FNP, have reviewed all documentation for this visit. The documentation on 07/17/20 for the exam, diagnosis, procedures, and orders are all accurate and complete.  IF YOU HAVE BEEN REFERRED TO A SPECIALIST, IT MAY TAKE 1-2 WEEKS TO SCHEDULE/PROCESS THE REFERRAL. IF YOU HAVE NOT HEARD FROM US/SPECIALIST IN TWO WEEKS, PLEASE GIVE [place] A CALL AT [phone].   THE PATIENT IS ENCOURAGED TO PRACTICE SOCIAL DISTANCING DUE TO THE COVID-19 PANDEMIC.

## 2020-07-21 ENCOUNTER — Encounter: Payer: Self-pay | Admitting: Nurse Practitioner

## 2020-07-30 ENCOUNTER — Other Ambulatory Visit: Payer: Self-pay

## 2020-07-30 ENCOUNTER — Encounter: Payer: Self-pay | Admitting: Cardiovascular Disease

## 2020-07-30 ENCOUNTER — Ambulatory Visit (INDEPENDENT_AMBULATORY_CARE_PROVIDER_SITE_OTHER): Payer: 59 | Admitting: Cardiovascular Disease

## 2020-07-30 VITALS — BP 114/68 | HR 76 | Ht 75.0 in | Wt 321.6 lb

## 2020-07-30 DIAGNOSIS — G4733 Obstructive sleep apnea (adult) (pediatric): Secondary | ICD-10-CM | POA: Diagnosis not present

## 2020-07-30 DIAGNOSIS — I10 Essential (primary) hypertension: Secondary | ICD-10-CM

## 2020-07-30 DIAGNOSIS — I493 Ventricular premature depolarization: Secondary | ICD-10-CM

## 2020-07-30 NOTE — Assessment & Plan Note (Signed)
His blood pressure is much better controlled on losartan and hydrochlorothiazide.  He is feeling well and was congratulated on exercising regularly.  No changes recommended at this time.

## 2020-07-30 NOTE — Assessment & Plan Note (Addendum)
Continue CPAP and weight loss. ?

## 2020-07-30 NOTE — Progress Notes (Signed)
Advanced Hypertension Clinic Initial Assessment:    Date:  07/30/2020   ID:  Luis Lopez, DOB [birth year], MRN [id number]  PCP:  [name], FNP  Cardiologist:  None  Nephrologist:  Referring MD: [name], FNP   CC: Hypertension  History of Present Illness:    Luis Lopez is a 45 y.o. male with a hx of hypertension, PVC, obstructive sleep apnea on CPAP, here to establish care in the hypertension clinic. He was first seen 04/2018 for the evaluation ofPVCs.Luis Lopez saw [name], FNP 02/2018 and reported palpitations.He had previously been seen in another provider's office where his heart was noted to be irregular. An EKG was obtained and he was found to have 3 PVCs on top of a baseline of sinus rhythm. He was asymptomatic at the time.  There was some concern that he may have also had a prior MI. Luis Lopez was referred for an echo that has not yet been completed.  Lab testing was unremarkable.  His BP was poorly controlled and he wanted to work on diet and exercise. At follow-up 07/2018 he was doing better and his blood pressure was mostly controlled.  On 05/2020 he saw Ms. Moore and his blood pressure control was worsening. She started him on Losartan and his bp was improving the following month. His blood pressure was 138/80 at that appointment on Losartan.  Today, he reports struggling with blood pressure for years now. However, his blood pressure is currently stable and he is feeling well overall. Losartan and HCTZ seems to be working for him and he is tolerating these medications. Also he continues to take aspirin every morning. Lately his weight is down. He has been using a treadmill at the [facility] or playing basketball for at least 2 hours, three times a week. He has no exertional chest pain or shortness of breath. Eliminating carbs and salt from his diet has helped improve his edema. He has also cut sodas, juices, and coffees. He is now cooking his meals at home, including  different foods such as beets and beans. He does not drink alcohol and was never a smoker. Regularly, he sleeps with a CPAP and is resting well at night. He is also beginning to wear compression socks as needed. Some of his siblings have hypertension and diabetes. He denies any chest pain, shortness of breath, or palpitations. No pre-syncope, syncope, or lightheadedness/dizziness to note.    Past Medical History:  Diagnosis Date  . Hypertension    borderline  . PVC (premature ventricular contraction) 04/29/2018  . Sleep apnea    uses a cpap  . Snoring     Past Surgical History:  Procedure Laterality Date  . INSERTION OF MESH N/A 11/03/2017   Procedure: INSERTION OF MESH;  Surgeon: Erroll Luna, MD;  Location: [place];  Service: General;  Laterality: N/A;  . UMBILICAL HERNIA REPAIR  11/03/2017  . UMBILICAL HERNIA REPAIR N/A 11/03/2017   Procedure: UMBILICAL HERNIA REPAIR WITH MESH;  Surgeon: Erroll Luna, MD;  Location: [place];  Service: General;  Laterality: N/A;    Current Medications: Current Meds  Medication Sig  . aspirin EC 81 MG tablet Take 81 mg by mouth daily.  . hydrochlorothiazide (HYDRODIURIL) 25 MG tablet TAKE 1 TABLET BY MOUTH DAILY. Pt must keep upcoming appointment for further refills  . losartan (COZAAR) 25 MG tablet Take 1 tablet (25 mg total) by mouth daily.  . metFORMIN (GLUCOPHAGE) 500 MG tablet Take 1 tablet (500 mg total) by mouth 2 (  two) times daily with a meal.  . Semaglutide,0.25 or 0.5MG /DOS, (OZEMPIC, 0.25 OR 0.5 MG/DOSE,) 2 MG/1.5ML SOPN Inject 0.5 mg into the skin once a week.  Marland Kitchen UNABLE TO FIND CPAP: At bedtime     Allergies:   Patient has no known allergies.   Social History   Socioeconomic History  . Marital status: Married    Spouse name: Not on file  . Number of children: Not on file  . Years of education: Not on file  . Highest education level: Not on file  Occupational History  . Not on file  Tobacco Use  . Smoking status: Former Smoker     Quit date: 03/23/2003    Years since quitting: 17.3  . Smokeless tobacco: Never Used  Vaping Use  . Vaping Use: Never used  Substance and Sexual Activity  . Alcohol use: No  . Drug use: No  . Sexual activity: Yes    Birth control/protection: None  Other Topics Concern  . Not on file  Social History Narrative  . Not on file   Social Determinants of Health   Financial Resource Strain: Low Risk   . Difficulty of Paying Living Expenses: Not hard at all  Food Insecurity: No Food Insecurity  . Worried About Charity fundraiser in the Last Year: Never true  . Ran Out of Food in the Last Year: Never true  Transportation Needs: No Transportation Needs  . Lack of Transportation (Medical): No  . Lack of Transportation (Non-Medical): No  Physical Activity: Sufficiently Active  . Days of Exercise per Week: 3 days  . Minutes of Exercise per Session: 120 min  Stress: No Stress Concern Present  . Feeling of Stress : Not at all  Social Connections: Not on file     Family History: The patient's family history includes Aneurysm in his paternal grandmother; Diabetes in his brother, father, and sister; Hypertension in his brother and sister.  ROS:   Please see the history of present illness.    All other systems reviewed and are negative.  EKGs/Labs/Other Studies Reviewed:    EKG:   07/30/2020: SR, rate 76, LAFB, LVH  Recent Labs: 06/19/2020: BUN 12; Creatinine, Ser 0.77; Potassium 3.9; Sodium 139   Recent Lipid Panel    Component Value Date/Time   CHOL 152 07/26/2018 1606   TRIG 82 07/26/2018 1606   HDL 36 (L) 07/26/2018 1606   CHOLHDL 4.2 07/26/2018 1606   LDLCALC 100 (H) 07/26/2018 1606    Physical Exam:   VS:  BP 114/68 (BP Location: Right Arm, Patient Position: Sitting, Cuff Size: Large)   Pulse 76   Ht 6\' 3"  (1.905 m)   Wt (!) 321 lb 9.6 oz (145.9 kg)   BMI 40.20 kg/m  , BMI Body mass index is 40.2 kg/m. GENERAL:  Well appearing HEENT: Pupils equal round and  reactive, fundi not visualized, oral mucosa unremarkable NECK:  No jugular venous distention, waveform within normal limits, carotid upstroke brisk and symmetric, no bruits LUNGS:  Clear to auscultation bilaterally HEART:  RRR.  PMI not displaced or sustained,S1 and S2 within normal limits, no S3, no S4, no clicks, no rubs, no murmurs ABD:  Flat, positive bowel sounds normal in frequency in pitch, no bruits, no rebound, no guarding, no midline pulsatile mass, no hepatomegaly, no splenomegaly EXT:  2 plus pulses throughout, no edema, no cyanosis no clubbing SKIN:  No rashes no nodules NEURO:  Cranial nerves II through XII grossly intact, motor grossly  intact throughout [facility]:  Cognitively intact, oriented to person place and time   ASSESSMENT/PLAN:    Essential hypertension His blood pressure is much better controlled on losartan and hydrochlorothiazide.  He is feeling well and was congratulated on exercising regularly.  No changes recommended at this time.    OSA (obstructive sleep apnea) Continue CPAP and weight loss.  Morbid obesity due to excess calories ([facility]) Continue diet and exercise.  He was congratulated on his efforts.   Screening for Secondary Hypertension:     Relevant Labs/Studies: Basic Labs Latest Ref Rng & Units 06/19/2020 09/26/2019 07/26/2018  Sodium 134 - 144 mmol/L 139 141 140  Potassium 3.5 - 5.2 mmol/L 3.9 4.0 3.7  Creatinine 0.76 - 1.27 mg/dL 0.77 0.84 0.85    Thyroid  Latest Ref Rng & Units 07/26/2018  TSH 0.450 - 4.500 uIU/mL 1.560                Disposition:    FU with MD/PharmD as needed.   Medication Adjustments/Labs and Tests Ordered: Current medicines are reviewed at length with the patient today.  Concerns regarding medicines are outlined above.  No orders of the defined types were placed in this encounter.  No orders of the defined types were placed in this encounter.  I,Mathew Stumpf,acting as a Education administrator for Skeet Latch, MD.,have documented  all relevant documentation on the behalf of Skeet Latch, MD,as directed by  Skeet Latch, MD while in the presence of Skeet Latch, MD.  I, Parma Oval Linsey, MD have reviewed all documentation for this visit.  The documentation of the exam, diagnosis, procedures, and orders on 07/30/2020 are all accurate and complete.   Signed, Skeet Latch, MD  07/30/2020 5:17 PM    [place]

## 2020-07-30 NOTE — Assessment & Plan Note (Signed)
Continue diet and exercise.  He was congratulated on his efforts.

## 2020-07-30 NOTE — Patient Instructions (Signed)
Medication Instructions:  Continue current medications  *If you need a refill on your cardiac medications before your next appointment, please call your pharmacy*   Lab Work: None Ordered   Testing/Procedures: None Ordered   Follow-Up: At [facility], you and your health needs are our priority.  As part of our continuing mission to provide you with exceptional heart care, we have created designated Provider Care Teams.  These Care Teams include your primary Cardiologist (physician) and Advanced Practice Providers (APPs -  Physician Assistants and Nurse Practitioners) who all work together to provide you with the care you need, when you need it.  We recommend signing up for the patient portal called "MyChart".  Sign up information is provided on this After Visit Summary.  MyChart is used to connect with patients for Virtual Visits (Telemedicine).  Patients are able to view lab/test results, encounter notes, upcoming appointments, etc.  Non-urgent messages can be sent to your provider as well.   To learn more about what you can do with MyChart, go to [email or web address].    Your next appointment:   As Needed

## 2020-08-16 ENCOUNTER — Other Ambulatory Visit: Payer: Self-pay | Admitting: Nurse Practitioner

## 2020-08-16 DIAGNOSIS — I10 Essential (primary) hypertension: Secondary | ICD-10-CM

## 2020-08-23 ENCOUNTER — Other Ambulatory Visit: Payer: Self-pay

## 2020-08-23 ENCOUNTER — Encounter (HOSPITAL_COMMUNITY): Payer: Self-pay | Admitting: Emergency Medicine

## 2020-08-23 ENCOUNTER — Ambulatory Visit (HOSPITAL_COMMUNITY)
Admission: EM | Admit: 2020-08-23 | Discharge: 2020-08-23 | Disposition: A | Discharge status: Home / Self Care | Payer: 59 | Attending: Student | Admitting: Student

## 2020-08-23 DIAGNOSIS — Z0289 Encounter for other administrative examinations: Secondary | ICD-10-CM

## 2020-08-23 DIAGNOSIS — E118 Type 2 diabetes mellitus with unspecified complications: Secondary | ICD-10-CM | POA: Diagnosis not present

## 2020-08-23 DIAGNOSIS — M7581 Other shoulder lesions, right shoulder: Secondary | ICD-10-CM

## 2020-08-23 DIAGNOSIS — E1169 Type 2 diabetes mellitus with other specified complication: Secondary | ICD-10-CM

## 2020-08-23 MED ORDER — TIZANIDINE HCL 2 MG PO CAPS: 2.0000 mg | ORAL_CAPSULE | Freq: Three times a day (TID) | ORAL | 0 refills | Status: DC

## 2020-08-23 MED ORDER — IBUPROFEN 800 MG PO TABS: 800.0000 mg | ORAL_TABLET | Freq: Three times a day (TID) | ORAL | 0 refills | Status: DC

## 2020-08-23 NOTE — ED Provider Notes (Signed)
[place]    CSN: [id number] Arrival date & time: 08/23/20  1722      History   Chief Complaint Chief Complaint  Patient presents with  . Shoulder Pain    HPI Luis Lopez is a 45 y.o. male presenting for right shoulder pain.  This is a chronic issue for this patient, related to rotator cuff tendinitis.  He is followed by Ortho and has been told he needed surgery on this.  He has not had this done because of scheduling constraints and work.  Today presents with the same symptoms, states he needs a work note.  Denies new trauma or overuse.  States he has difficulty raising the arm due to the pain and decreased range of motion.  States he requires something for the discomfort and needs a work note.  Denies sensation changes.  HPI  Past Medical History:  Diagnosis Date  . Hypertension    borderline  . PVC (premature ventricular contraction) 04/29/2018  . Sleep apnea    uses a cpap  . Snoring     Patient Active Problem List   Diagnosis Date Noted  . Muscle spasm 07/26/2018  . Acute bilateral low back pain without sciatica 07/26/2018  . PVC (premature ventricular contraction) 04/29/2018  . Abnormal EKG 03/01/2018  . Essential hypertension 03/01/2018  . Umbilical hernia 83/38/2505  . Morbid obesity due to excess calories ([place]) 11/06/2015  . Nocturia more than twice per night 11/06/2015  . Sleep deprivation 11/06/2015  . Hypersomnia with sleep apnea 11/06/2015  . OSA (obstructive sleep apnea) 11/06/2015    Past Surgical History:  Procedure Laterality Date  . INSERTION OF MESH N/A 11/03/2017   Procedure: INSERTION OF MESH;  Surgeon: Erroll Luna, MD;  Location: [facility];  Service: General;  Laterality: N/A;  . UMBILICAL HERNIA REPAIR  11/03/2017  . UMBILICAL HERNIA REPAIR N/A 11/03/2017   Procedure: UMBILICAL HERNIA REPAIR WITH MESH;  Surgeon: Erroll Luna, MD;  Location: [facility];  Service: General;  Laterality: N/A;       Home Medications    Prior to  Admission medications   Medication Sig Start Date End Date Taking? Authorizing Provider  aspirin EC 81 MG tablet Take 81 mg by mouth daily.   Yes [provider]  hydrochlorothiazide (HYDRODIURIL) 25 MG tablet TAKE 1 TABLET BY MOUTH DAILY 08/19/20  Yes [name], FNP  ibuprofen (ADVIL) 800 MG tablet Take 1 tablet (800 mg total) by mouth 3 (three) times daily. 08/23/20  Yes Hazel Sams, PA-C  losartan (COZAAR) 25 MG tablet Take 1 tablet (25 mg total) by mouth daily. 06/19/20  Yes [name], FNP  metFORMIN (GLUCOPHAGE) 500 MG tablet Take 1 tablet (500 mg total) by mouth 2 (two) times daily with a meal. 06/25/20 06/25/21 Yes [name], FNP  Semaglutide,0.25 or 0.5MG /DOS, (OZEMPIC, 0.25 OR 0.5 MG/DOSE,) 2 MG/1.5ML SOPN Inject 0.5 mg into the skin once a week. 07/10/20  Yes [name], FNP  tizanidine (ZANAFLEX) 2 MG capsule Take 1 capsule (2 mg total) by mouth 3 (three) times daily. 08/23/20  Yes Hazel Sams, PA-C  UNABLE TO FIND CPAP: At bedtime    [provider]    Family History Family History  Problem Relation Age of Onset  . Diabetes Father   . Diabetes Brother   . Hypertension Brother   . Diabetes Sister   . Hypertension Sister   . Aneurysm Paternal Grandmother        brain  Social History Social History   Tobacco Use  . Smoking status: Former Smoker    Quit date: 03/23/2003    Years since quitting: 17.4  . Smokeless tobacco: Never Used  Vaping Use  . Vaping Use: Never used  Substance Use Topics  . Alcohol use: No  . Drug use: No     Allergies   Patient has no known allergies.   Review of Systems Review of Systems  Musculoskeletal:       R shoulder pain  All other systems reviewed and are negative.    Physical Exam Triage Vital Signs ED Triage Vitals  Enc Vitals Group     BP 08/23/20 1827 111/83     Pulse Rate 08/23/20 1827 77     Resp 08/23/20 1827 20     Temp 08/23/20 1827 98.5 F (36.9 C)     Temp Source 08/23/20 1827  Oral     SpO2 08/23/20 1827 97 %     Weight --      Height --      Head Circumference --      Peak Flow --      Pain Score 08/23/20 1824 7     Pain Loc --      Pain Edu? --      Excl. in [place]? --    No data found.  Updated Vital Signs BP 111/83 (BP Location: Right Arm) Comment (BP Location): large cuff  Pulse 77   Temp 98.5 F (36.9 C) (Oral)   Resp 20   SpO2 97%   Visual Acuity Right Eye Distance:   Left Eye Distance:   Bilateral Distance:    Right Eye Near:   Left Eye Near:    Bilateral Near:     Physical Exam Vitals reviewed.  Constitutional:      General: He is not in acute distress.    Appearance: Normal appearance. He is obese. He is not ill-appearing or diaphoretic.  HENT:     Head: Normocephalic and atraumatic.  Cardiovascular:     Rate and Rhythm: Normal rate and regular rhythm.     Heart sounds: Normal heart sounds.  Pulmonary:     Effort: Pulmonary effort is normal.     Breath sounds: Normal breath sounds.  Musculoskeletal:     Comments: Cervical spine is without tenderness or bony deformity.  No step-off.  Right shoulder is without obvious bony deformity.  No effusion, skin swelling, erythema, fluctuance.  No clavicular deformity or tenderness.  Some tenderness over the anterior deltoid.  Range of motion intact but pain with abduction.  Positive empty beer can.  Negative cross body adduction.  Strength 5 out of 5, sensation intact.  Radial pulse 2+, cap refill is < 2 seconds.  Right elbow appearance normal, no obvious bony deformity.  Range of motion intact and without pain.  Skin:    General: Skin is warm.  Neurological:     General: No focal deficit present.     Mental Status: He is alert and oriented to person, place, and time.  Psychiatric:        Mood and Affect: Mood normal.        Behavior: Behavior normal.        Thought Content: Thought content normal.        Judgment: Judgment normal.     UC Treatments / Results  Labs (all labs  ordered are listed, but only abnormal results are displayed) Labs Reviewed - No data to  display  EKG   Radiology No results found.  Procedures Procedures (including critical care time)  Medications Ordered in UC Medications - No data to display  Initial Impression / Assessment and Plan / UC Course  I have reviewed the triage vital signs and the nursing notes.  Pertinent labs & imaging results that were available during my care of the patient were reviewed by me and considered in my medical decision making (see chart for details).     This patient is a 45 year old male presenting with chronic rotator cuff issues of the right shoulder.  He is here because he needs a work note. Already followed by Ortho for this issue, last visit with them was 05/24/2020.  He is due to follow-up but has not due to scheduling constraint. Needs to schedule rotator cuff repair but has not done this.  Neurovascularly intact.  No new trauma or overuse.  A1c 11.5. did not send prednisone. Trial of zanaflex and ibuprofen as below.  Work note provided for 1 day.  ED return precautions discussed  Final Clinical Impressions(s) / UC Diagnoses   Final diagnoses:  Tendinitis of right rotator cuff  Type 2 diabetes mellitus with other specified complication, without long-term current use of insulin ([place])  Encounter to obtain excuse from work     Discharge Instructions     -Start the muscle relaxer-Zanaflex (tizanidine), up to 3 times daily for muscle spasms and pain.  This can make you drowsy, so take at bedtime or when you do not need to drive or operate machinery. -Take Tylenol 1000 mg 3 times daily, and ibuprofen 800 mg 3 times daily with food.  You can take these together, or alternate every 3-4 hours. -Follow-up with your orthopedist     ED Prescriptions    Medication Sig Dispense Auth. Provider   tizanidine (ZANAFLEX) 2 MG capsule Take 1 capsule (2 mg total) by mouth 3 (three) times daily. 21 capsule  Hazel Sams, PA-C   ibuprofen (ADVIL) 800 MG tablet Take 1 tablet (800 mg total) by mouth 3 (three) times daily. 21 tablet Hazel Sams, PA-C     PDMP not reviewed this encounter.   Hazel Sams, PA-C 08/23/20 1925

## 2020-08-23 NOTE — ED Triage Notes (Signed)
Pain in the right shoulder for 2 months.  Patient has seen an orthopedic specialist, but has not been able to follow up again due to work schedule/source of income for family.  Patient reports he is noticing range of motion is being negatively impacted.

## 2020-08-23 NOTE — Discharge Instructions (Signed)
-  Start the muscle relaxer-Zanaflex (tizanidine), up to 3 times daily for muscle spasms and pain.  This can make you drowsy, so take at bedtime or when you do not need to drive or operate machinery. -Take Tylenol 1000 mg 3 times daily, and ibuprofen 800 mg 3 times daily with food.  You can take these together, or alternate every 3-4 hours. -Follow-up with your orthopedist

## 2020-09-03 ENCOUNTER — Other Ambulatory Visit: Payer: Self-pay

## 2020-09-03 ENCOUNTER — Encounter: Payer: Self-pay | Admitting: Nurse Practitioner

## 2020-09-03 ENCOUNTER — Ambulatory Visit (INDEPENDENT_AMBULATORY_CARE_PROVIDER_SITE_OTHER): Payer: 59 | Admitting: Nurse Practitioner

## 2020-09-03 VITALS — BP 138/82 | HR 84 | Temp 98.4°F | Ht 75.0 in | Wt 321.8 lb

## 2020-09-03 DIAGNOSIS — I10 Essential (primary) hypertension: Secondary | ICD-10-CM

## 2020-09-03 DIAGNOSIS — I2510 Atherosclerotic heart disease of native coronary artery without angina pectoris: Secondary | ICD-10-CM | POA: Diagnosis not present

## 2020-09-03 DIAGNOSIS — Z6841 Body Mass Index (BMI) 40.0 and over, adult: Secondary | ICD-10-CM

## 2020-09-03 DIAGNOSIS — E1169 Type 2 diabetes mellitus with other specified complication: Secondary | ICD-10-CM | POA: Diagnosis not present

## 2020-09-03 LAB — LIPID PANEL
Chol/HDL Ratio: 3.3 ratio (ref 0.0–5.0)
Cholesterol, Total: 130 mg/dL (ref 100–199)
HDL: 40 mg/dL (ref >39)
LDL Chol Calc (NIH): 76 mg/dL (ref 0–99)
Triglycerides: 65 mg/dL (ref 0–149)
VLDL Cholesterol Cal: 14 mg/dL (ref 5–40)

## 2020-09-03 LAB — BMP8+EGFR
BUN/Creatinine Ratio: 16 (ref 9–20)
BUN: 13 mg/dL (ref 6–24)
CO2: 25 mmol/L (ref 20–29)
Calcium: 9.1 mg/dL (ref 8.7–10.2)
Chloride: 99 mmol/L (ref 96–106)
Creatinine, Ser: 0.79 mg/dL (ref 0.76–1.27)
Glucose: 117 mg/dL — ABNORMAL HIGH (ref 65–99)
Potassium: 3.9 mmol/L (ref 3.5–5.2)
Sodium: 140 mmol/L (ref 134–144)
eGFR: 113 mL/min/{1.73_m2} (ref >59)

## 2020-09-03 LAB — HEMOGLOBIN A1C
Est. average glucose Bld gHb Est-mCnc: 131 mg/dL
Hgb A1c MFr Bld: 6.2 % — ABNORMAL HIGH (ref 4.8–5.6)

## 2020-09-03 MED ORDER — GVOKE HYPOPEN 2-PACK 0.5 MG/0.1ML ~~LOC~~ SOAJ: 0.5000 mg | SUBCUTANEOUS | 2 refills | Status: AC | PRN

## 2020-09-03 NOTE — Progress Notes (Signed)
I,Yamilka Roman [employer] as a Education administrator for [employer], FNP.,have documented all relevant documentation on the behalf of [name], FNP,as directed by  [name], FNP while in the presence of [name], [place].  This visit occurred during the SARS-CoV-2 public health emergency.  Safety protocols were in place, including screening questions prior to the visit, additional usage of staff PPE, and extensive cleaning of exam room while observing appropriate contact time as indicated for disinfecting solutions.  Subjective:     Patient ID: Luis Lopez , male    DOB: 10/20/1975 , 45 y.o.   MRN: [id number]   Chief Complaint  Patient presents with   Diabetes   Hypertension    HPI  Pt is here today for b/p and diabetes check (has started medications for diabetes), he is on 0.5 mg Ozempic, denies nausea. Tolerating well. He has noticed he gets full more quickly.  Continues to exercise 3 days a week. His shoulder has been bothering him so he has cut back a little on his exercising.  Wt Readings from Last 3 Encounters: 09/03/20 : (!) 321 lb 12.8 oz (146 kg) 07/30/20 : (!) 321 lb 9.6 oz (145.9 kg) 07/17/20 : (!) 325 lb 12.8 oz (147.8 kg)       Diabetes He presents for his follow-up diabetic visit. He has type 2 diabetes mellitus. Pertinent negatives for hypoglycemia include no headaches. There are no diabetic associated symptoms. Pertinent negatives for diabetes include no chest pain.  Hypertension This is a chronic problem. The current episode started more than 1 year ago. The problem has been gradually worsening since onset. The problem is uncontrolled. Pertinent negatives include no anxiety, chest pain, headaches, malaise/fatigue or palpitations. Risk factors for coronary artery disease include obesity and sedentary lifestyle. Treatments tried: Has been without his medications since April.  There are no compliance problems.  There is no history of angina. There is no history of chronic  renal disease.    Past Medical History:  Diagnosis Date   Hypertension    borderline   PVC (premature ventricular contraction) 04/29/2018   Sleep apnea    uses a cpap   Snoring      Family History  Problem Relation Age of Onset   Diabetes Father    Diabetes Brother    Hypertension Brother    Diabetes Sister    Hypertension Sister    Aneurysm Paternal Grandmother        brain     Current Outpatient Medications:    aspirin EC 81 MG tablet, Take 81 mg by mouth daily., Disp: , Rfl:    hydrochlorothiazide (HYDRODIURIL) 25 MG tablet, TAKE 1 TABLET BY MOUTH DAILY, Disp: 30 tablet, Rfl: 1   ibuprofen (ADVIL) 800 MG tablet, Take 1 tablet (800 mg total) by mouth 3 (three) times daily., Disp: 21 tablet, Rfl: 0   losartan (COZAAR) 25 MG tablet, Take 1 tablet (25 mg total) by mouth daily., Disp: 30 tablet, Rfl: 2   metFORMIN (GLUCOPHAGE) 500 MG tablet, Take 1 tablet (500 mg total) by mouth 2 (two) times daily with a meal., Disp: 60 tablet, Rfl: 2   Semaglutide,0.25 or 0.5MG/DOS, (OZEMPIC, 0.25 OR 0.5 MG/DOSE,) 2 MG/1.5ML SOPN, Inject 0.5 mg into the skin once a week., Disp: 4.5 mL, Rfl: 1   tizanidine (ZANAFLEX) 2 MG capsule, Take 1 capsule (2 mg total) by mouth 3 (three) times daily., Disp: 21 capsule, Rfl: 0   UNABLE TO FIND, CPAP: At bedtime, Disp: , Rfl:  No Known Allergies   Review of Systems  Constitutional:  Negative for malaise/fatigue.  Cardiovascular:  Negative for chest pain and palpitations.  Neurological:  Negative for headaches.    Today's Vitals   09/03/20 0856  BP: 138/82  Pulse: 84  Temp: 98.4 F (36.9 C)  Weight: (!) 321 lb 12.8 oz (146 kg)  Height: _0  (1.905 m)  PainSc: 0-No pain   Body mass index is 40.22 kg/m.   Objective:  Physical Exam Vitals reviewed.  Constitutional:      General: He is not in acute distress.    Appearance: Normal appearance. He is obese.  Cardiovascular:     Rate and Rhythm: Normal rate and regular rhythm.     Pulses:  Normal pulses.     Heart sounds: Normal heart sounds. No murmur heard. Pulmonary:     Effort: Pulmonary effort is normal. No respiratory distress.     Breath sounds: Normal breath sounds. No wheezing.  Skin:    Capillary Refill: Capillary refill takes less than 2 seconds.  Neurological:     General: No focal deficit present.     Mental Status: He is alert and oriented to person, place, and time.     Cranial Nerves: No cranial nerve deficit.  Psychiatric:        Mood and Affect: Mood normal.        Behavior: Behavior normal.        Thought Content: Thought content normal.        Judgment: Judgment normal.        Assessment And Plan:     1. Type 2 diabetes mellitus with other specified complication, without long-term current use of insulin ([place]) This is a new diagnosis, his HgbA1c was 11.9 at his last visit, started him on metformin and ozempic and is doing well.  Referral for diabetes education and nutrition - Hemoglobin A1c - BMP8+eGFR  2. Primary hypertension Chronic, much better controlled with losartan and HCTZ - BMP8+eGFR  3. Atherosclerotic cardiovascular disease Explained we will likely need to start him on a low dose statin pending his lipid levels. - Lipid panel  4. Class 3 severe obesity due to excess calories with serious comorbidity and body mass index (BMI) of 40.0 to 44.9 in adult [facility])  Chronic Discussed healthy diet and regular exercise options  Encouraged to continue exercising at least 150 minutes per week with 2 days of strength training   Patient was given opportunity to ask questions. Patient verbalized understanding of the plan and was able to repeat key elements of the plan. All questions were answered to their satisfaction.  [name], FNP   I, [name], FNP, have reviewed all documentation for this visit. The documentation on 09/03/20 for the exam, diagnosis, procedures, and orders are all accurate and complete.   IF YOU HAVE BEEN REFERRED  TO A SPECIALIST, IT MAY TAKE 1-2 WEEKS TO SCHEDULE/PROCESS THE REFERRAL. IF YOU HAVE NOT HEARD FROM US/SPECIALIST IN TWO WEEKS, PLEASE GIVE [place] A CALL AT [phone].   THE PATIENT IS ENCOURAGED TO PRACTICE SOCIAL DISTANCING DUE TO THE COVID-19 PANDEMIC.

## 2020-09-03 NOTE — Patient Instructions (Addendum)
Hypertension, Adult Hypertension is another name for high blood pressure. High blood pressure forces your heart to work harder to pump blood. This can cause problems overtime. There are two numbers in a blood pressure reading. There is a top number (systolic) over a bottom number (diastolic). It is best to have a blood pressure that is below 120/80. Healthy choicescan help lower your blood pressure, or you may need medicine to help lower it. What are the causes? The cause of this condition is not known. Some conditions may be related tohigh blood pressure. What increases the risk? Smoking. Having type 2 diabetes mellitus, high cholesterol, or both. Not getting enough exercise or physical activity. Being overweight. Having too much fat, sugar, calories, or salt (sodium) in your diet. Drinking too much alcohol. Having long-term (chronic) kidney disease. Having a family history of high blood pressure. Age. Risk increases with age. Race. You may be at higher risk if you are African American. Gender. Men are at higher risk than women before age 20. After age 72, women are at higher risk than men. Having obstructive sleep apnea. Stress. What are the signs or symptoms? High blood pressure may not cause symptoms. Very high blood pressure (hypertensive crisis) may cause: Headache. Feelings of worry or nervousness (anxiety). Shortness of breath. Nosebleed. A feeling of being sick to your stomach (nausea). Throwing up (vomiting). Changes in how you see. Very bad chest pain. Seizures. How is this treated? This condition is treated by making healthy lifestyle changes, such as: Eating healthy foods. Exercising more. Drinking less alcohol. Your health care provider may prescribe medicine if lifestyle changes are not enough to get your blood pressure under control, and if: Your top number is above 130. Your bottom number is above 80. Your personal target blood pressure may vary. Follow these  instructions at home: Eating and drinking  If told, follow the DASH eating plan. To follow this plan: Fill one half of your plate at each meal with fruits and vegetables. Fill one fourth of your plate at each meal with whole grains. Whole grains include whole-wheat pasta, brown rice, and whole-grain bread. Eat or drink low-fat dairy products, such as skim milk or low-fat yogurt. Fill one fourth of your plate at each meal with low-fat (lean) proteins. Low-fat proteins include fish, chicken without skin, eggs, beans, and tofu. Avoid fatty meat, cured and processed meat, or chicken with skin. Avoid pre-made or processed food. Eat less than 1,500 mg of salt each day. Do not drink alcohol if: Your doctor tells you not to drink. You are pregnant, may be pregnant, or are planning to become pregnant. If you drink alcohol: Limit how much you use to: 0-1 drink a day for women. 0-2 drinks a day for men. Be aware of how much alcohol is in your drink. In the [place] one drink equals one 12 oz bottle of beer (355 mL), one 5 oz glass of wine (148 mL), or one 1 oz glass of hard liquor (44 mL).  Lifestyle  Work with your doctor to stay at a healthy weight or to lose weight. Ask your doctor what the best weight is for you. Get at least 30 minutes of exercise most days of the week. This may include walking, swimming, or biking. Get at least 30 minutes of exercise that strengthens your muscles (resistance exercise) at least 3 days a week. This may include lifting weights or doing Pilates. Do not use any products that contain nicotine or tobacco, such as cigarettes,  e-cigarettes, and chewing tobacco. If you need help quitting, ask your doctor. Check your blood pressure at home as told by your doctor. Keep all follow-up visits as told by your doctor. This is important.  Medicines Take over-the-counter and prescription medicines only as told by your doctor. Follow directions carefully. Do not skip doses of  blood pressure medicine. The medicine does not work as well if you skip doses. Skipping doses also puts you at risk for problems. Ask your doctor about side effects or reactions to medicines that you should watch for. Contact a doctor if you: Think you are having a reaction to the medicine you are taking. Have headaches that keep coming back (recurring). Feel dizzy. Have swelling in your ankles. Have trouble with your vision. Get help right away if you: Get a very bad headache. Start to feel mixed up (confused). Feel weak or numb. Feel faint. Have very bad pain in your: Chest. Belly (abdomen). Throw up more than once. Have trouble breathing. Summary Hypertension is another name for high blood pressure. High blood pressure forces your heart to work harder to pump blood. For most people, a normal blood pressure is less than 120/80. Making healthy choices can help lower blood pressure. If your blood pressure does not get lower with healthy choices, you may need to take medicine. This information is not intended to replace advice given to you by your health care provider. Make sure you discuss any questions you have with your healthcare provider. Document Revised: 11/16/2017 Document Reviewed: 11/16/2017 Elsevier Patient Education  [place].   Goal blood sugar less than 120 if less than 70 watch for symptoms of hypoglycemia - sweating, jittery or lightheaded. Keep peanut butter crackers available or take Gvoke

## 2020-09-05 ENCOUNTER — Telehealth: Payer: Self-pay

## 2020-09-05 NOTE — Telephone Encounter (Signed)
Left the patient a message to call back for lab results. 

## 2020-09-05 NOTE — Telephone Encounter (Signed)
-----   Message from [name], [place] sent at 09/04/2020  4:11 PM EDT ----- Your HgbA1c is down to 6.2 from 11.5 this is AWESOME, continue taking the Ozempic. Your glucose is much better as well down to 117 from 287.  Continue focusing on a healthy diet and exercise

## 2020-09-15 ENCOUNTER — Other Ambulatory Visit: Payer: Self-pay | Admitting: Nurse Practitioner

## 2020-09-15 DIAGNOSIS — I10 Essential (primary) hypertension: Secondary | ICD-10-CM

## 2020-09-26 ENCOUNTER — Ambulatory Visit (INDEPENDENT_AMBULATORY_CARE_PROVIDER_SITE_OTHER): Payer: 59 | Admitting: Orthopaedic Surgery

## 2020-09-26 DIAGNOSIS — M25511 Pain in right shoulder: Secondary | ICD-10-CM | POA: Diagnosis not present

## 2020-09-26 DIAGNOSIS — G8929 Other chronic pain: Secondary | ICD-10-CM

## 2020-09-26 NOTE — Progress Notes (Signed)
Office Visit Note   Patient: Luis Lopez           Date of Birth: [birth year]           MRN: [id number] Visit Date: 09/26/2020              Requested by: [name], [address] [place],  [place] [address] PCP: [name], FNP   Assessment & Plan: Visit Diagnoses:  1. Chronic right shoulder pain     Plan: Impression is chronic right shoulder pain concerning for rotator cuff pathology.  Based on the patient's clinical symptoms and his response to subacromial injection, I feel his symptoms are more likely coming from his rotator cuff rather than his labrum or biceps.  He does however have a fair amount of pain with testing these so we will go ahead and order an MR arthrogram to assess his rotator cuff, biceps and labrum.  He will follow-up with [place] once has been completed.  In the meantime, we have provided him a work note for desk work only for the next 4 weeks.  Call with concerns or questions in the meantime.  Follow-Up Instructions: No follow-ups on file.   Orders:  No orders of the defined types were placed in this encounter.  No orders of the defined types were placed in this encounter.     Procedures: No procedures performed   Clinical Data: No additional findings.   Subjective: Chief Complaint  Patient presents with   Right Shoulder - Pain    HPI patient is a pleasant 45 year old gentleman who comes in today with recurrent right shoulder pain.  He was seen in our office several months ago with about a month of right shoulder pain after playing basketball.  No specific injury.  The pain he has is to the anterior shoulder and into the deltoid and is described as a deep ache worse with any activity of the shoulder as well as with sleeping on the right side.  He has tried pain medication without relief.  He underwent subacromial cortisone injection back in March with great relief which only lasted for about a day.  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  He is having a hard time working due to the pain.  Review of Systems as detailed in HPI.  All others reviewed and are negative.   Objective: Vital Signs: There were no vitals taken for this visit.  Physical Exam well-developed well-nourished gentleman in no acute distress.  Alert and oriented x3.  Ortho Exam right shoulder exam shows range of motion to approximately 110 degrees.  Near full external rotation.  Abduction to 90 degrees.  Internal rotation to his back pocket.  Markedly positive empty can with 3 out of 5 strength.  Positive speeds and positive O'Brien's test.  No pain or weakness with resisted internal or external rotation.  He is neurovascular intact distally.  Specialty Comments:  No specialty comments available.  Imaging: No new imaging   PMFS History: Patient Active Problem List   Diagnosis Date Noted   Muscle spasm 07/26/2018   Acute bilateral low back pain without sciatica 07/26/2018   PVC (premature ventricular contraction) 04/29/2018   Abnormal EKG 03/01/2018   Essential hypertension 40/10/6759   Umbilical hernia 95/11/3265   Morbid obesity due to excess calories ([place]) 11/06/2015   Nocturia more than twice per night 11/06/2015   Sleep deprivation 11/06/2015   Hypersomnia with  sleep apnea 11/06/2015   OSA (obstructive sleep apnea) 11/06/2015   Past Medical History:  Diagnosis Date   Hypertension    borderline   PVC (premature ventricular contraction) 04/29/2018   Sleep apnea    uses a cpap   Snoring     Family History  Problem Relation Age of Onset   Diabetes Father    Diabetes Brother    Hypertension Brother    Diabetes Sister    Hypertension Sister    Aneurysm Paternal Grandmother        brain    Past Surgical History:  Procedure Laterality Date   INSERTION OF MESH N/A 11/03/2017   Procedure: INSERTION OF MESH;  Surgeon: Erroll Luna, MD;  Location: [facility];  Service: General;  Laterality: N/A;    UMBILICAL HERNIA REPAIR  16/12/9602   UMBILICAL HERNIA REPAIR N/A 11/03/2017   Procedure: UMBILICAL HERNIA REPAIR WITH MESH;  Surgeon: Erroll Luna, MD;  Location: [facility];  Service: General;  Laterality: N/A;   Social History   Occupational History   Not on file  Tobacco Use   Smoking status: Former    Pack years: 0.00    Types: Cigarettes    Quit date: 03/23/2003    Years since quitting: 17.5   Smokeless tobacco: Never  Vaping Use   Vaping Use: Never used  Substance and Sexual Activity   Alcohol use: No   Drug use: No   Sexual activity: Yes    Birth control/protection: None

## 2020-10-02 ENCOUNTER — Other Ambulatory Visit: Payer: Self-pay | Admitting: Nurse Practitioner

## 2020-10-09 ENCOUNTER — Inpatient Hospital Stay: Admission: RE | Admit: 2020-10-09 | Payer: 59 | Source: Ambulatory Visit

## 2020-10-09 ENCOUNTER — Other Ambulatory Visit: Payer: 59

## 2020-10-13 ENCOUNTER — Other Ambulatory Visit: Payer: Self-pay

## 2020-10-13 ENCOUNTER — Ambulatory Visit
Admission: RE | Admit: 2020-10-13 | Discharge: 2020-10-13 | Disposition: A | Discharge status: Home / Self Care | Payer: 59 | Source: Ambulatory Visit | Attending: Orthopaedic Surgery | Admitting: Orthopaedic Surgery

## 2020-10-13 DIAGNOSIS — G8929 Other chronic pain: Secondary | ICD-10-CM

## 2020-10-13 IMAGING — XA DG FLUORO GUIDE NDL PLC/BX
1 series · 1 of 1 positions shown · non-contrast
Comparison: none

CLINICAL DATA: Right shoulder pain.

[Series 1: ortho adipose · 1 of 1 slices shown]
[im 1/1]
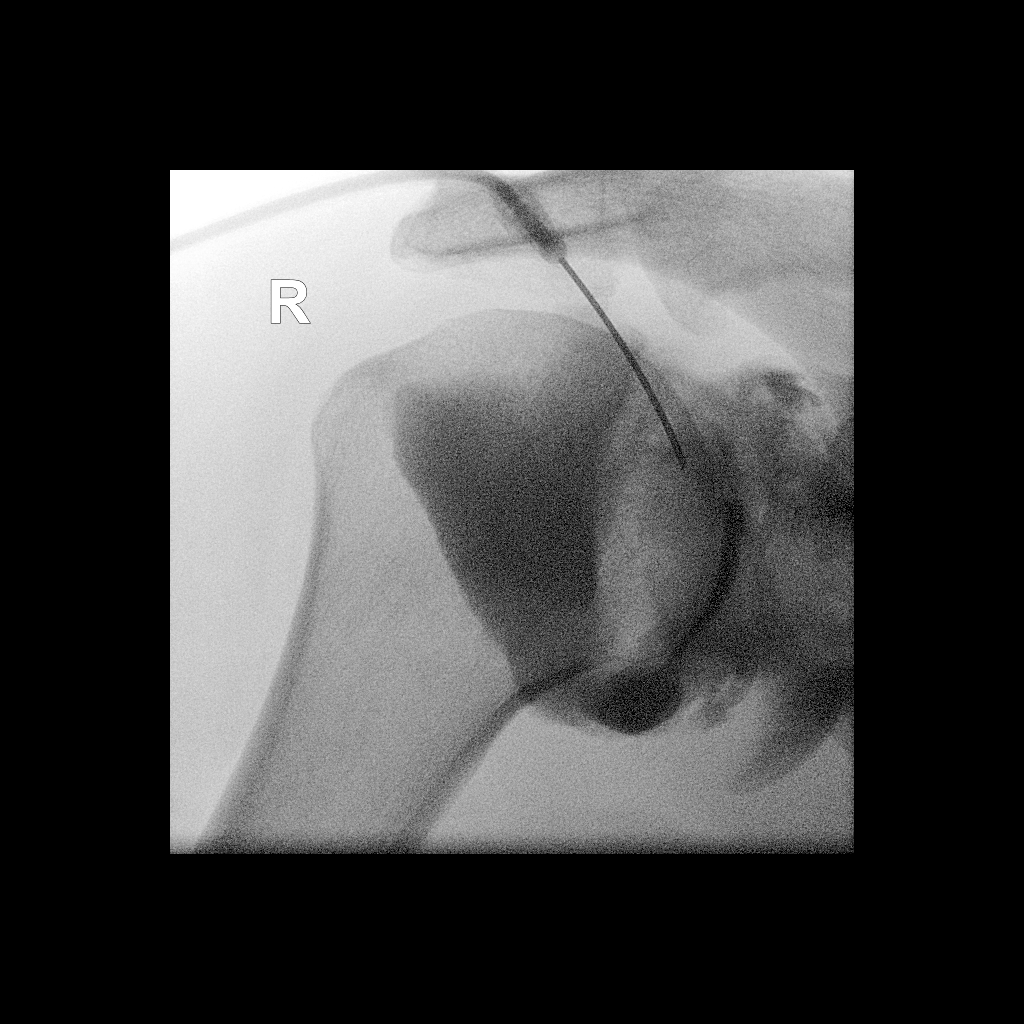

[1 of 1 positions shown; findings below may reference images not displayed]

FLUOROSCOPY TIME:  Radiation Exposure Index (as provided by the
fluoroscopic device): 26.14 uGy*m2

PROCEDURE:
Right SHOULDER INJECTION UNDER FLUOROSCOPY

An appropriate skin entrance site was determined. The site was
marked, prepped with Betadine, draped in the usual sterile fashion,
and infiltrated locally with buffered Lidocaine. 22 gauge spinal
needle was advanced to the superomedial margin of the humeral head
under intermittent fluoroscopy. 1 ml of Lidocaine injected easily. A
mixture of 0.1 ml Multihance and 20 ml of dilute Isovue M 200 was
then used to opacify the right shoulder capsule. No immediate
complication.
IMPRESSION: Technically successful right shoulder injection for MRI.

## 2020-10-13 IMAGING — MR MR SHOULDER*R* W/CM
4 of 5 series · 13 of 40 positions shown · IV contrast (agent unspecified)
Comparison: X-ray [DATE]

CLINICAL DATA: Shoulder pain, rotator cuff disorder suspected, xray
done. Pain and limited range of motion for 6 months

EXAM:
MR ARTHROGRAM OF THE RIGHT SHOULDER
TECHNIQUE: Multiplanar, multisequence MR imaging of the right shoulder was
performed following the administration of intra-articular contrast.
CONTRAST:  See Injection Documentation.

[Series 7: T1 fat-sat · axial · right · 3.0mm · 0.36mm/px · z∈[-8,+59]mm · 3 of 25 slices shown (1 of 2)]
[im 3/25]
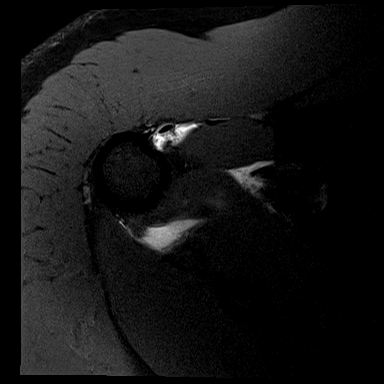
[im 14/25]
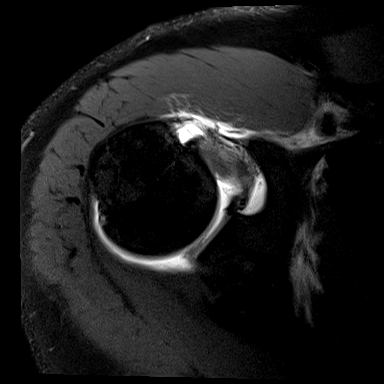
[im 22/25]
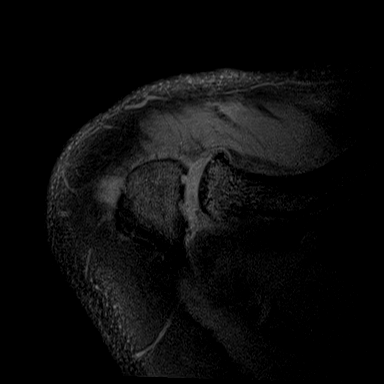

[Series 8: T2 fat-sat · oblique · right · 3.0mm · 0.22mm/px · 4 of 25 slices shown (1 of 2)]
[im 1/25]
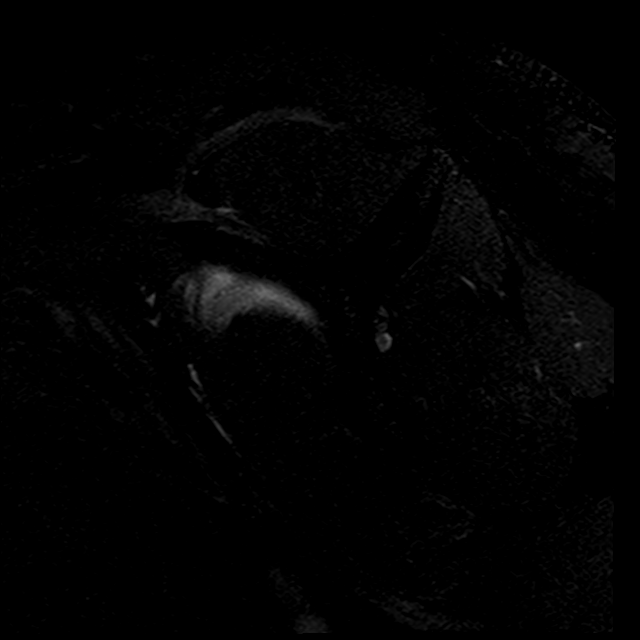
[im 4/25]
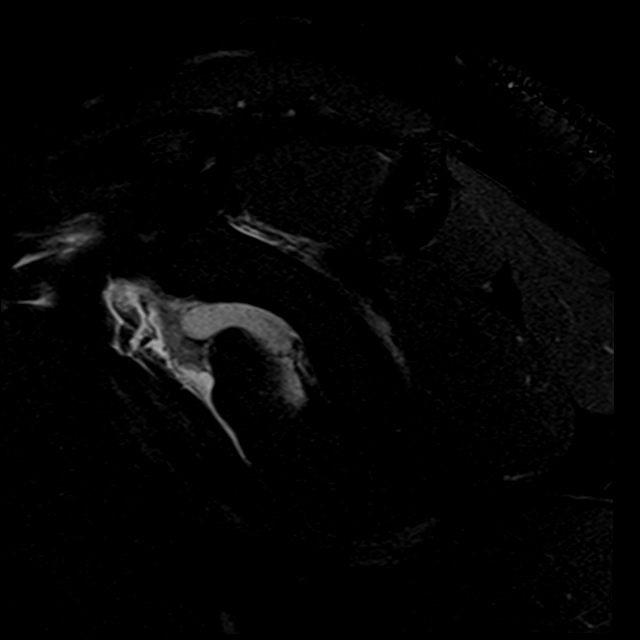
[im 13/25]
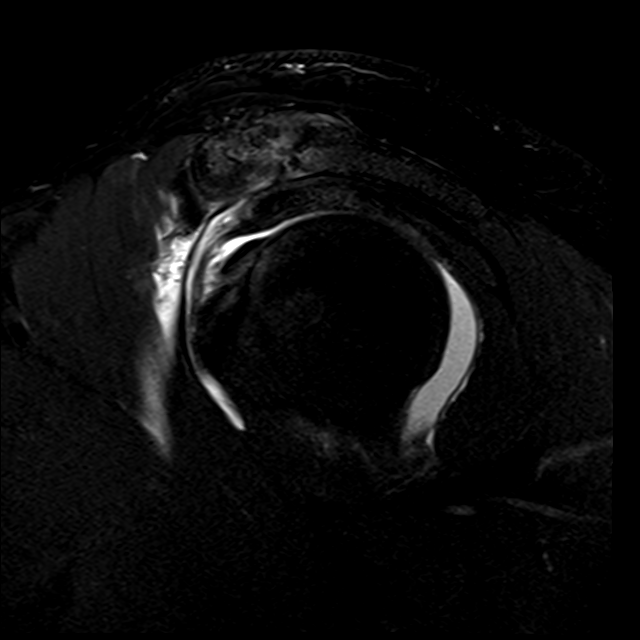
[im 22/25]
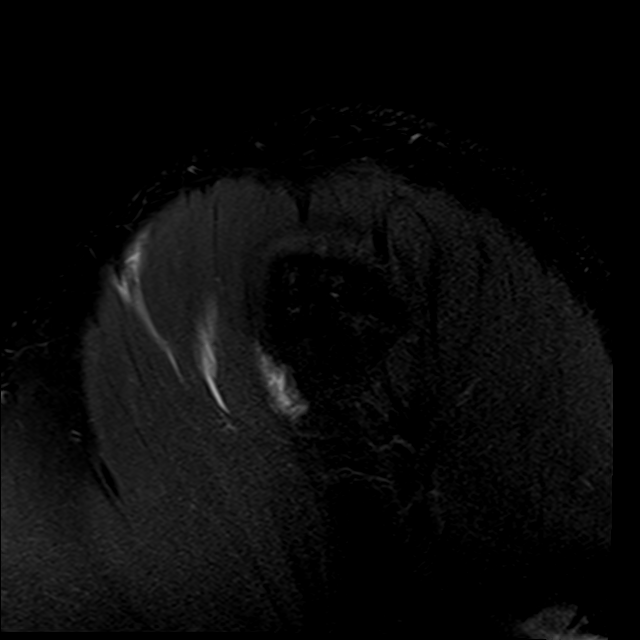

[Series 9: T1 fat-sat · oblique · right · 3.0mm · 0.18mm/px · 3 of 21 slices shown (2 of 2)]
[im 4/21]
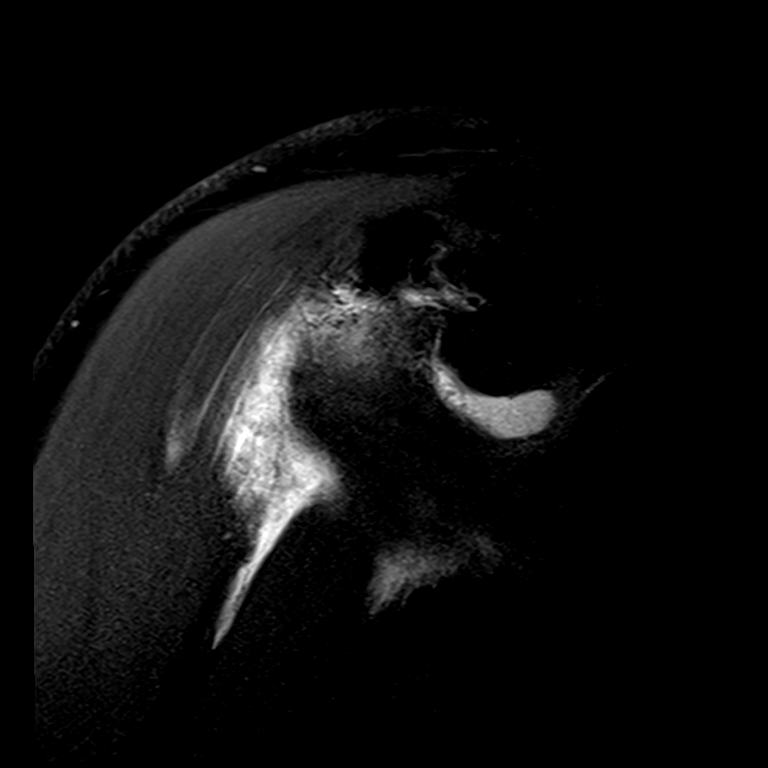
[im 11/21]
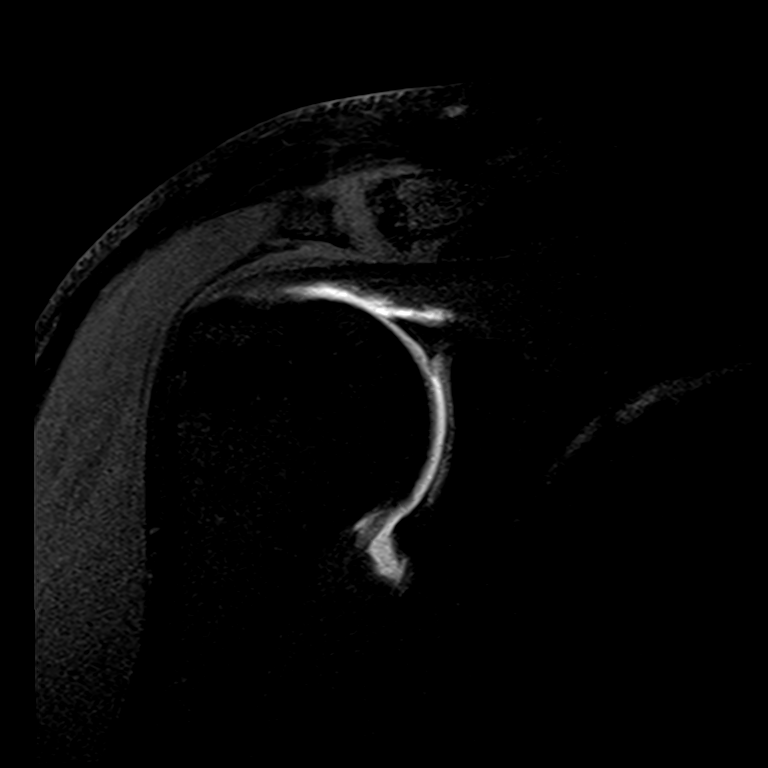
[im 17/21]
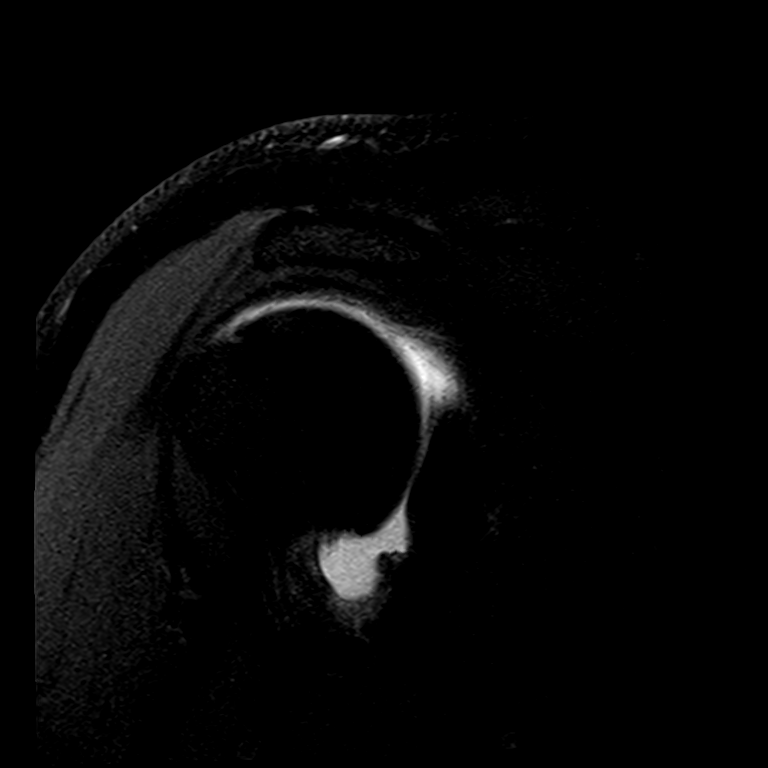

[Series 11: T2 fat-sat · oblique · right · 3.0mm · 0.22mm/px · 3 of 21 slices shown (2 of 2)]
[im 4/21]
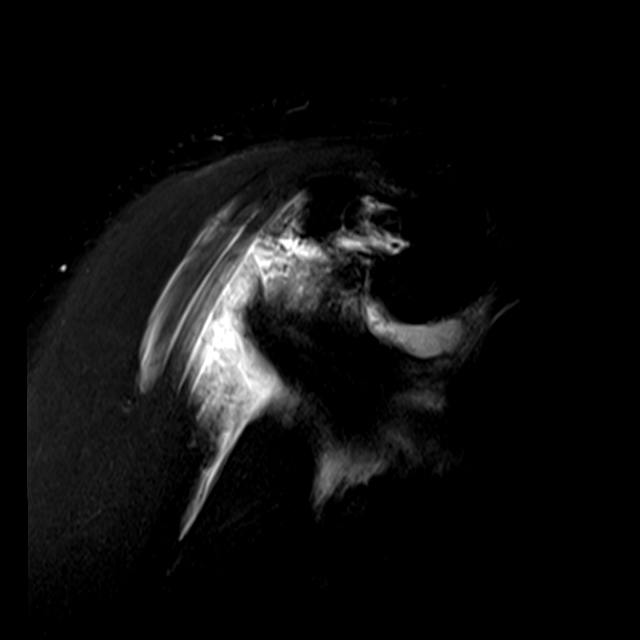
[im 11/21]
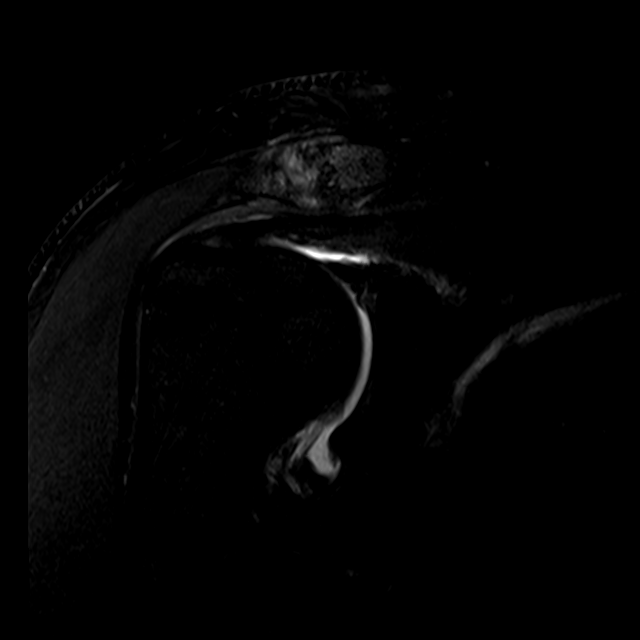
[im 17/21]
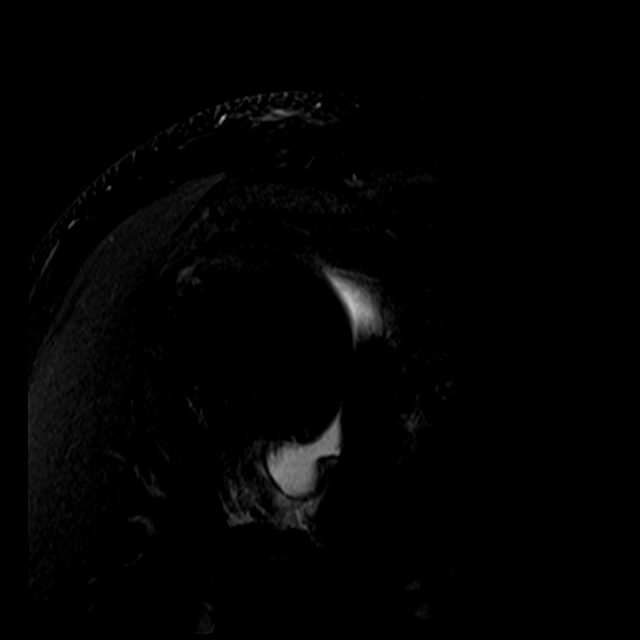

[13 of 40 positions shown; findings below may reference images not displayed]

FINDINGS: Rotator cuff: Moderate supraspinatus tendinosis with focal
partial-thickness articular surface tear of the anterior leading
edge of the distal tendon involving approximately 50% of the tendon
depth (series 11, image 14). Irregular bursal sided tear of the
anterior supraspinatus tendon in the region of the critical zone
underlying distal clavicular osteophyte (series 11, image 13). No
full-thickness or retracted tear. Mild infraspinatus and
subscapularis tendinosis without discrete tear. Intact teres minor.

Muscles: Preserved bulk and signal intensity of the rotator cuff
musculature without edema, atrophy, or fatty infiltration.

Biceps long head: Intact.

Acromioclavicular Joint: Moderate acromioclavicular osteoarthritis
with prominent inferiorly oriented osteophyte emanating from the
distal clavicle. Trace subacromial-subdeltoid bursal fluid. No
intra-articular contrast extends into the bursal space.

Glenohumeral Joint: Adequately distended with injected contrast. No
focal cartilage defect.

Labrum: Irregular partial-thickness tearing at the chondrolabral
junction of the posterosuperior and posterior labrum involving
approximately the [DATE] to [DATE] positions (series 9, images 7-11).
No paralabral cyst.

Bones: Degenerative subchondral marrow changes at the AC joint. No
acute fracture. No dislocation. No suspicious bone lesion.

Other: None.
IMPRESSION: 1. Moderate supraspinatus tendinosis with focal partial-thickness
articular surface tear of the anterior leading edge of the distal
tendon involving approximately 50% of the tendon depth. There is
also an irregular bursal sided tear of the anterior supraspinatus
tendon in the region of the critical zone underlying a distal
clavicular osteophyte.
2. Mild infraspinatus and subscapularis tendinosis without discrete
tear.
3. Irregular partial-thickness tearing of the posterosuperior labrum
involving approximately the [DATE] to [DATE] positions.
4. Moderate acromioclavicular osteoarthritis with prominent distal
clavicular osteophyte.

## 2020-10-13 MED ORDER — IOPAMIDOL (ISOVUE-M 200) INJECTION 41%
15.0000 mL | Freq: Once | INTRAMUSCULAR | Status: AC
  Administered 2020-10-13: 15 mL via INTRA_ARTICULAR

## 2020-10-14 NOTE — Progress Notes (Signed)
Needs appt.  Thanks.

## 2020-10-15 ENCOUNTER — Other Ambulatory Visit: Payer: Self-pay | Admitting: Nurse Practitioner

## 2020-10-15 ENCOUNTER — Encounter: Payer: Self-pay | Admitting: Orthopaedic Surgery

## 2020-10-15 DIAGNOSIS — I10 Essential (primary) hypertension: Secondary | ICD-10-CM

## 2020-10-15 NOTE — Telephone Encounter (Signed)
Spoke with patient and he will call to make an appointment to talk about surgery.  I reviewed the MRI with him briefly over the phone

## 2020-10-16 ENCOUNTER — Encounter: Payer: 59 | Attending: Nurse Practitioner | Admitting: Dietician

## 2020-10-21 ENCOUNTER — Encounter: Payer: Self-pay | Admitting: Nurse Practitioner

## 2020-10-21 ENCOUNTER — Other Ambulatory Visit: Payer: Self-pay

## 2020-10-21 ENCOUNTER — Ambulatory Visit (INDEPENDENT_AMBULATORY_CARE_PROVIDER_SITE_OTHER): Payer: 59 | Admitting: Nurse Practitioner

## 2020-10-21 VITALS — BP 132/88 | HR 83 | Temp 98.0°F | Ht 75.0 in | Wt 331.6 lb

## 2020-10-21 DIAGNOSIS — E1169 Type 2 diabetes mellitus with other specified complication: Secondary | ICD-10-CM

## 2020-10-21 DIAGNOSIS — E66813 Obesity, class 3: Secondary | ICD-10-CM

## 2020-10-21 DIAGNOSIS — Z6841 Body Mass Index (BMI) 40.0 and over, adult: Secondary | ICD-10-CM

## 2020-10-21 DIAGNOSIS — I10 Essential (primary) hypertension: Secondary | ICD-10-CM

## 2020-10-21 MED ORDER — LOSARTAN POTASSIUM 25 MG PO TABS: ORAL_TABLET | ORAL | 1 refills | Status: DC

## 2020-10-21 MED ORDER — HYDROCHLOROTHIAZIDE 25 MG PO TABS: 25.0000 mg | ORAL_TABLET | Freq: Every day | ORAL | 1 refills | Status: DC

## 2020-10-21 NOTE — Patient Instructions (Signed)

## 2020-10-21 NOTE — Progress Notes (Signed)
I,Luis Lopez,acting as a Education administrator for [employer], FNP.,have documented all relevant documentation on the behalf of Luis Brine, FNP,as directed by  Luis Brine, FNP while in the presence of Luis Lopez, [place].  This visit occurred during the SARS-CoV-2 public health emergency.  Safety protocols were in place, including screening questions prior to the visit, additional usage of staff PPE, and extensive cleaning of exam room while observing appropriate contact time as indicated for disinfecting solutions.  Subjective:     Patient ID: Luis Lopez , male    DOB: [birth year] , 45 y.o.   MRN: [id number]   Chief Complaint  Patient presents with   Diabetes   Hypertension    HPI  Pt is here today for b/p and diabetes check.  He is planning to have surgery to right shoulder; has appt tomorrow to get more information. He has not been able to exercise as much.    Diabetes He presents for his follow-up diabetic visit. He has type 2 diabetes mellitus. Pertinent negatives for hypoglycemia include no headaches. There are no diabetic associated symptoms. Pertinent negatives for diabetes include no chest pain. There are no diabetic complications. Risk factors for coronary artery disease include male sex and obesity. Current diabetic treatment includes diet and oral agent (dual therapy).  Hypertension This is a chronic problem. The current episode started more than 1 year ago. The problem has been gradually worsening since onset. The problem is uncontrolled. Pertinent negatives include no anxiety, chest pain, headaches, malaise/fatigue or palpitations. Risk factors for coronary artery disease include obesity and sedentary lifestyle. Treatments tried: Has been without his medications since April.  There are no compliance problems.  There is no history of angina. There is no history of chronic renal disease.    Past Medical History:  Diagnosis Date   Diabetes mellitus without complication ([place])     Hypertension    borderline   PVC (premature ventricular contraction) 04/29/2018   Sleep apnea    uses a cpap   Snoring      Family History  Problem Relation Age of Onset   Diabetes Father    Diabetes Brother    Hypertension Brother    Diabetes Sister    Hypertension Sister    Aneurysm Paternal Grandmother        brain     Current Outpatient Medications:    aspirin EC 81 MG tablet, Take 81 mg by mouth daily., Disp: , Rfl:    Glucagon (GVOKE HYPOPEN 2-PACK) 0.5 MG/0.1ML SOAJ, Inject 0.5 mg into the skin as needed., Disp: 0.2 mL, Rfl: 2   ibuprofen (ADVIL) 800 MG tablet, Take 1 tablet (800 mg total) by mouth 3 (three) times daily., Disp: 21 tablet, Rfl: 0   metFORMIN (GLUCOPHAGE) 500 MG tablet, Take 1 tablet (500 mg total) by mouth 2 (two) times daily with a meal., Disp: 60 tablet, Rfl: 2   OZEMPIC, 0.25 OR 0.5 MG/DOSE, 2 MG/1.5ML SOPN, INJECT 0.5 MG INTO THE SKIN ONCE A WEEK, Disp: 4.5 mL, Rfl: 1   UNABLE TO FIND, CPAP: At bedtime, Disp: , Rfl:    hydrochlorothiazide (HYDRODIURIL) 25 MG tablet, Take 1 tablet (25 mg total) by mouth daily., Disp: 90 tablet, Rfl: 1   losartan (COZAAR) 25 MG tablet, TAKE 1 TABLET(25 MG) BY MOUTH DAILY, Disp: 90 tablet, Rfl: 1   No Known Allergies   Review of Systems  Constitutional: Negative.  Negative for malaise/fatigue.  Respiratory: Negative.    Cardiovascular: Negative.  Negative for chest pain  and palpitations.  Gastrointestinal: Negative.   Neurological:  Negative for headaches.  Psychiatric/Behavioral: Negative.    All other systems reviewed and are negative.   Today's Vitals   10/21/20 0959  BP: 132/88  Pulse: 83  Temp: 98 F (36.7 C)  TempSrc: Oral  Weight: (!) 331 lb 9.6 oz (150.4 kg)  Height: '6\' 3"'$  (1.905 m)   Body mass index is 41.45 kg/m.  Wt Readings from Last 3 Encounters:  10/21/20 (!) 331 lb 9.6 oz (150.4 kg)  09/03/20 (!) 321 lb 12.8 oz (146 kg)  07/30/20 (!) 321 lb 9.6 oz (145.9 kg)    BP Readings from Last 3  Encounters:  10/21/20 132/88  09/03/20 138/82  08/23/20 111/83    Objective:  Physical Exam Vitals reviewed.  Constitutional:      General: He is not in acute distress.    Appearance: Normal appearance. He is obese.  Cardiovascular:     Rate and Rhythm: Normal rate and regular rhythm.     Pulses: Normal pulses.     Heart sounds: Normal heart sounds. No murmur heard. Pulmonary:     Effort: Pulmonary effort is normal. No respiratory distress.     Breath sounds: Normal breath sounds. No wheezing.  Skin:    Capillary Refill: Capillary refill takes less than 2 seconds.  Neurological:     General: No focal deficit present.     Mental Status: He is alert and oriented to person, place, and time.     Cranial Nerves: No cranial nerve deficit.     Motor: No weakness.  Psychiatric:        Mood and Affect: Mood normal.        Behavior: Behavior normal.        Thought Content: Thought content normal.        Judgment: Judgment normal.        Assessment And Plan:     1. Type 2 diabetes mellitus with other specified complication, without long-term current use of insulin (HCC) Chronic, has improved significantly  Continue current medications  No labs this visit  2. Essential hypertension Chronic, better controlled Continue current medications - hydrochlorothiazide (HYDRODIURIL) 25 MG tablet; Take 1 tablet (25 mg total) by mouth daily.  Dispense: 90 tablet; Refill: 1  3. Class 3 severe obesity due to excess calories with serious comorbidity and body mass index (BMI) of 40.0 to 44.9 in adult [facility])  He is encouraged to initially strive for BMI less than 30 to decrease cardiac risk. He is advised to exercise no less than 150 minutes per week.    Patient was given opportunity to ask questions. Patient verbalized understanding of the plan and was able to repeat key elements of the plan. All questions were answered to their satisfaction.  Luis Brine, FNP    I, Luis Brine, FNP, have  reviewed all documentation for this visit. The documentation on 10/21/20 for the exam, diagnosis, procedures, and orders are all accurate and complete.   IF YOU HAVE BEEN REFERRED TO A SPECIALIST, IT MAY TAKE 1-2 WEEKS TO SCHEDULE/PROCESS THE REFERRAL. IF YOU HAVE NOT HEARD FROM US/SPECIALIST IN TWO WEEKS, PLEASE GIVE [place] A CALL AT [phone].   THE PATIENT IS ENCOURAGED TO PRACTICE SOCIAL DISTANCING DUE TO THE COVID-19 PANDEMIC.

## 2020-10-22 ENCOUNTER — Encounter: Payer: Self-pay | Admitting: Orthopaedic Surgery

## 2020-10-22 ENCOUNTER — Other Ambulatory Visit: Payer: Self-pay

## 2020-10-22 ENCOUNTER — Ambulatory Visit (INDEPENDENT_AMBULATORY_CARE_PROVIDER_SITE_OTHER): Payer: 59 | Admitting: Orthopaedic Surgery

## 2020-10-22 ENCOUNTER — Encounter (HOSPITAL_BASED_OUTPATIENT_CLINIC_OR_DEPARTMENT_OTHER): Payer: Self-pay | Admitting: Orthopaedic Surgery

## 2020-10-22 DIAGNOSIS — M7541 Impingement syndrome of right shoulder: Secondary | ICD-10-CM | POA: Diagnosis not present

## 2020-10-22 DIAGNOSIS — M75101 Unspecified rotator cuff tear or rupture of right shoulder, not specified as traumatic: Secondary | ICD-10-CM

## 2020-10-22 NOTE — Progress Notes (Signed)
Office Visit Note   Patient: Luis Lopez           Date of Birth: 04-Sep-1975           MRN: [id number] Visit Date: 10/22/2020              Requested by: Luis Lopez, Luis Lopez [place],  [place] [address] PCP: Luis Brine, FNP   Assessment & Plan: Visit Diagnoses:  1. Impingement syndrome of right shoulder   2. Nontraumatic tear of supraspinatus tendon, right     Plan: MRI of the right shoulder shows significant partial-thickness tearing of the supraspinatus of at least 50%.  There is also advanced AC joint arthritis with impingement effects.  Appears that he has a degenerative SLAP tear.  These findings were reviewed with Luis Lopez and his wife Luis Lopez in detail and due to failure of conservative treatment for the last 6 months that included subacromial injection, rest, home exercise strengthening program the patient has elected to move forward with arthroscopic surgery.  Details of the surgery reviewed with the patient today including the risks benefits rehab recovery time.  Plan is for extensive debridement, distal clavicle excision, subacromial decompression with possible biceps tenodesis and rotator cuff repair.  Questions encouraged and answered.  Follow-Up Instructions: Return for Postop.   Orders:  No orders of the defined types were placed in this encounter.  No orders of the defined types were placed in this encounter.     Procedures: No procedures performed   Clinical Data: No additional findings.   Subjective: Chief Complaint  Patient presents with   Right Shoulder - Follow-up    HPI Luis Lopez returns today with his wife for MRI review of the right shoulder.  Overall he has had pain and limitation range of motion for 6 months.  The pain is 7 out of 10 with constant throbbing and aching with occasional sharp stabbing pain.  Review of Systems  Constitutional: Negative.   All other systems reviewed and are negative.   Objective: Vital Signs:  There were no vitals taken for this visit.  Physical Exam Vitals and nursing note reviewed.  Constitutional:      Appearance: He is well-developed.  Pulmonary:     Effort: Pulmonary effort is normal.  Abdominal:     Palpations: Abdomen is soft.  Skin:    General: Skin is warm.  Neurological:     Mental Status: He is alert and oriented to person, place, and time.  Psychiatric:        Behavior: Behavior normal.        Thought Content: Thought content normal.        Judgment: Judgment normal.    Ortho Exam Examination of the right shoulder shows forward flexion to 150 degrees with severe pain.  Normal external rotation.  Abduction to 90 degrees with pain.  Pain and weakness with empty can test.  Positive Speed and positive O'Brien's test.  Positive cross body adduction.  Specialty Comments:  No specialty comments available.  Imaging: No results found.   PMFS History: Patient Active Problem List   Diagnosis Date Noted   Muscle spasm 07/26/2018   Acute bilateral low back pain without sciatica 07/26/2018   PVC (premature ventricular contraction) 04/29/2018   Abnormal EKG 03/01/2018   Essential hypertension [id number]   Umbilical hernia [id number]   Morbid obesity due to excess calories ([place]) 11/06/2015   Nocturia more than twice per night 11/06/2015   Sleep deprivation 11/06/2015  Hypersomnia with sleep apnea 11/06/2015   OSA (obstructive sleep apnea) 11/06/2015   Past Medical History:  Diagnosis Date   Hypertension    borderline   PVC (premature ventricular contraction) 04/29/2018   Sleep apnea    uses a cpap   Snoring     Family History  Problem Relation Age of Onset   Diabetes Father    Diabetes Brother    Hypertension Brother    Diabetes Sister    Hypertension Sister    Aneurysm Paternal Grandmother        brain    Past Surgical History:  Procedure Laterality Date   INSERTION OF MESH N/A 11/03/2017   Procedure: INSERTION OF MESH;  Surgeon: Erroll Luna, MD;  Location: [facility];  Service: General;  Laterality: N/A;   UMBILICAL HERNIA REPAIR  [id number]   UMBILICAL HERNIA REPAIR N/A 11/03/2017   Procedure: UMBILICAL HERNIA REPAIR WITH MESH;  Surgeon: Erroll Luna, MD;  Location: [facility];  Service: General;  Laterality: N/A;   Social History   Occupational History   Not on file  Tobacco Use   Smoking status: Former    Types: Cigarettes    Quit date: 03/23/2003    Years since quitting: 17.5   Smokeless tobacco: Never  Vaping Use   Vaping Use: Never used  Substance and Sexual Activity   Alcohol use: No   Drug use: No   Sexual activity: Yes    Birth control/protection: None

## 2020-10-24 ENCOUNTER — Encounter (HOSPITAL_BASED_OUTPATIENT_CLINIC_OR_DEPARTMENT_OTHER)
Admission: RE | Admit: 2020-10-24 | Discharge: 2020-10-24 | Disposition: A | Discharge status: Home / Self Care | Payer: 59 | Source: Ambulatory Visit | Attending: Orthopaedic Surgery | Admitting: Orthopaedic Surgery

## 2020-10-24 DIAGNOSIS — Z01812 Encounter for preprocedural laboratory examination: Secondary | ICD-10-CM | POA: Insufficient documentation

## 2020-10-24 LAB — BASIC METABOLIC PANEL
Anion gap: 10 (ref 5–15)
BUN: 12 mg/dL (ref 6–20)
CO2: 26 mmol/L (ref 22–32)
Calcium: 9.2 mg/dL (ref 8.9–10.3)
Chloride: 102 mmol/L (ref 98–111)
Creatinine, Ser: 0.78 mg/dL (ref 0.61–1.24)
GFR, Estimated: >60 mL/min (ref >60)
Glucose, Bld: 108 mg/dL — ABNORMAL HIGH (ref 70–99)
Potassium: 4.1 mmol/L (ref 3.5–5.1)
Sodium: 138 mmol/L (ref 135–145)

## 2020-10-24 NOTE — Progress Notes (Signed)

## 2020-10-29 ENCOUNTER — Ambulatory Visit (HOSPITAL_BASED_OUTPATIENT_CLINIC_OR_DEPARTMENT_OTHER): Payer: 59 | Admitting: Certified Registered"

## 2020-10-29 ENCOUNTER — Encounter (HOSPITAL_BASED_OUTPATIENT_CLINIC_OR_DEPARTMENT_OTHER): Payer: Self-pay | Admitting: Orthopaedic Surgery

## 2020-10-29 ENCOUNTER — Encounter (HOSPITAL_BASED_OUTPATIENT_CLINIC_OR_DEPARTMENT_OTHER)
Admission: RE | Disposition: A | Discharge status: Home / Self Care | Payer: Self-pay | Source: Home / Self Care | Attending: Orthopaedic Surgery

## 2020-10-29 ENCOUNTER — Other Ambulatory Visit: Payer: Self-pay

## 2020-10-29 ENCOUNTER — Ambulatory Visit (HOSPITAL_BASED_OUTPATIENT_CLINIC_OR_DEPARTMENT_OTHER)
Admission: RE | Admit: 2020-10-29 | Discharge: 2020-10-29 | Disposition: A | Discharge status: Home / Self Care | Payer: 59 | Attending: Orthopaedic Surgery | Admitting: Orthopaedic Surgery

## 2020-10-29 DIAGNOSIS — Z87891 Personal history of nicotine dependence: Secondary | ICD-10-CM | POA: Insufficient documentation

## 2020-10-29 DIAGNOSIS — Z791 Long term (current) use of non-steroidal anti-inflammatories (NSAID): Secondary | ICD-10-CM | POA: Diagnosis not present

## 2020-10-29 DIAGNOSIS — S43431A Superior glenoid labrum lesion of right shoulder, initial encounter: Secondary | ICD-10-CM | POA: Diagnosis not present

## 2020-10-29 DIAGNOSIS — Z7982 Long term (current) use of aspirin: Secondary | ICD-10-CM | POA: Diagnosis not present

## 2020-10-29 DIAGNOSIS — Z833 Family history of diabetes mellitus: Secondary | ICD-10-CM | POA: Diagnosis not present

## 2020-10-29 DIAGNOSIS — E119 Type 2 diabetes mellitus without complications: Secondary | ICD-10-CM | POA: Diagnosis not present

## 2020-10-29 DIAGNOSIS — M778 Other enthesopathies, not elsewhere classified: Secondary | ICD-10-CM | POA: Insufficient documentation

## 2020-10-29 DIAGNOSIS — M94211 Chondromalacia, right shoulder: Secondary | ICD-10-CM | POA: Insufficient documentation

## 2020-10-29 DIAGNOSIS — G4730 Sleep apnea, unspecified: Secondary | ICD-10-CM | POA: Insufficient documentation

## 2020-10-29 DIAGNOSIS — M19011 Primary osteoarthritis, right shoulder: Secondary | ICD-10-CM | POA: Diagnosis not present

## 2020-10-29 DIAGNOSIS — M75101 Unspecified rotator cuff tear or rupture of right shoulder, not specified as traumatic: Secondary | ICD-10-CM

## 2020-10-29 DIAGNOSIS — Z7984 Long term (current) use of oral hypoglycemic drugs: Secondary | ICD-10-CM | POA: Insufficient documentation

## 2020-10-29 DIAGNOSIS — Z8249 Family history of ischemic heart disease and other diseases of the circulatory system: Secondary | ICD-10-CM | POA: Insufficient documentation

## 2020-10-29 DIAGNOSIS — M7541 Impingement syndrome of right shoulder: Secondary | ICD-10-CM | POA: Diagnosis not present

## 2020-10-29 DIAGNOSIS — X58XXXA Exposure to other specified factors, initial encounter: Secondary | ICD-10-CM | POA: Insufficient documentation

## 2020-10-29 DIAGNOSIS — I10 Essential (primary) hypertension: Secondary | ICD-10-CM | POA: Insufficient documentation

## 2020-10-29 HISTORY — PX: SHOULDER ACROMIOPLASTY: SHX6093

## 2020-10-29 HISTORY — DX: Unspecified rotator cuff tear or rupture of right shoulder, not specified as traumatic: M75.101

## 2020-10-29 LAB — GLUCOSE, CAPILLARY
Glucose-Capillary: 111 mg/dL — ABNORMAL HIGH (ref 70–99)
Glucose-Capillary: 159 mg/dL — ABNORMAL HIGH (ref 70–99)

## 2020-10-29 SURGERY — SHOULDER ACROMIOPLASTY
Anesthesia: General | Site: Shoulder | Laterality: Right

## 2020-10-29 MED ORDER — PROMETHAZINE HCL 25 MG/ML IJ SOLN: 6.2500 mg | INTRAMUSCULAR | Status: DC | PRN

## 2020-10-29 MED ORDER — PHENYLEPHRINE HCL (PRESSORS) 10 MG/ML IV SOLN
INTRAVENOUS | Status: DC | PRN
  Administered 2020-10-29 (×2): 80 ug via INTRAVENOUS

## 2020-10-29 MED ORDER — FENTANYL CITRATE (PF) 100 MCG/2ML IJ SOLN
100.0000 ug | Freq: Once | INTRAMUSCULAR | Status: AC
  Administered 2020-10-29: 100 ug via INTRAVENOUS

## 2020-10-29 MED ORDER — SODIUM CHLORIDE 0.9 % IR SOLN
Status: DC | PRN
  Administered 2020-10-29: 12000 mL

## 2020-10-29 MED ORDER — CEFAZOLIN IN SODIUM CHLORIDE 3-0.9 GM/100ML-% IV SOLN
INTRAVENOUS | Status: AC
  Filled 2020-10-29: qty 100

## 2020-10-29 MED ORDER — FENTANYL CITRATE (PF) 100 MCG/2ML IJ SOLN
INTRAMUSCULAR | Status: DC | PRN
  Administered 2020-10-29: 50 ug via INTRAVENOUS

## 2020-10-29 MED ORDER — GLYCOPYRROLATE PF 0.2 MG/ML IJ SOSY
PREFILLED_SYRINGE | INTRAMUSCULAR | Status: AC
  Filled 2020-10-29: qty 2

## 2020-10-29 MED ORDER — BUPIVACAINE HCL (PF) 0.5 % IJ SOLN
INTRAMUSCULAR | Status: DC | PRN
  Administered 2020-10-29: 20 mL via PERINEURAL

## 2020-10-29 MED ORDER — LACTATED RINGERS IV SOLN: INTRAVENOUS | Status: DC

## 2020-10-29 MED ORDER — MIDAZOLAM HCL 2 MG/2ML IJ SOLN
INTRAMUSCULAR | Status: AC
  Filled 2020-10-29: qty 2

## 2020-10-29 MED ORDER — MIDAZOLAM HCL 2 MG/2ML IJ SOLN
2.0000 mg | Freq: Once | INTRAMUSCULAR | Status: AC
  Administered 2020-10-29: 2 mg via INTRAVENOUS

## 2020-10-29 MED ORDER — ROCURONIUM BROMIDE 100 MG/10ML IV SOLN
INTRAVENOUS | Status: DC | PRN
  Administered 2020-10-29: 80 mg via INTRAVENOUS

## 2020-10-29 MED ORDER — OXYCODONE-ACETAMINOPHEN 5-325 MG PO TABS: 1.0000 | ORAL_TABLET | Freq: Three times a day (TID) | ORAL | 0 refills | Status: DC | PRN

## 2020-10-29 MED ORDER — BUPIVACAINE-EPINEPHRINE (PF) 0.25% -1:200000 IJ SOLN
INTRAMUSCULAR | Status: DC | PRN
  Administered 2020-10-29: 30 mL

## 2020-10-29 MED ORDER — BUPIVACAINE LIPOSOME 1.3 % IJ SUSP
INTRAMUSCULAR | Status: DC | PRN
  Administered 2020-10-29: 10 mL via PERINEURAL

## 2020-10-29 MED ORDER — HYDROMORPHONE HCL 1 MG/ML IJ SOLN: 0.2500 mg | INTRAMUSCULAR | Status: DC | PRN

## 2020-10-29 MED ORDER — FENTANYL CITRATE (PF) 100 MCG/2ML IJ SOLN
INTRAMUSCULAR | Status: AC
  Filled 2020-10-29: qty 2

## 2020-10-29 MED ORDER — OXYCODONE HCL 5 MG/5ML PO SOLN
5.0000 mg | Freq: Once | ORAL | Status: DC | PRN
Start: 2020-10-29 — End: 2020-10-29

## 2020-10-29 MED ORDER — CEFAZOLIN IN SODIUM CHLORIDE 3-0.9 GM/100ML-% IV SOLN
3.0000 g | INTRAVENOUS | Status: AC
  Administered 2020-10-29: 3 g via INTRAVENOUS

## 2020-10-29 MED ORDER — KETOROLAC TROMETHAMINE 30 MG/ML IJ SOLN: 30.0000 mg | Freq: Once | INTRAMUSCULAR | Status: DC | PRN

## 2020-10-29 MED ORDER — DEXAMETHASONE SODIUM PHOSPHATE 4 MG/ML IJ SOLN
INTRAMUSCULAR | Status: DC | PRN
  Administered 2020-10-29: 4 mg via INTRAVENOUS

## 2020-10-29 MED ORDER — ONDANSETRON HCL 4 MG PO TABS: 4.0000 mg | ORAL_TABLET | Freq: Three times a day (TID) | ORAL | 0 refills | Status: DC | PRN

## 2020-10-29 MED ORDER — PROPOFOL 10 MG/ML IV BOLUS
INTRAVENOUS | Status: DC | PRN
  Administered 2020-10-29: 200 mg via INTRAVENOUS

## 2020-10-29 MED ORDER — LIDOCAINE HCL (CARDIAC) PF 100 MG/5ML IV SOSY
PREFILLED_SYRINGE | INTRAVENOUS | Status: DC | PRN
  Administered 2020-10-29: 30 mg via INTRAVENOUS

## 2020-10-29 MED ORDER — OXYCODONE HCL 5 MG PO TABS: 5.0000 mg | ORAL_TABLET | Freq: Once | ORAL | Status: DC | PRN

## 2020-10-29 MED ORDER — SUGAMMADEX SODIUM 200 MG/2ML IV SOLN
INTRAVENOUS | Status: DC | PRN
  Administered 2020-10-29: 200 mg via INTRAVENOUS

## 2020-10-29 MED ORDER — ROCURONIUM BROMIDE 10 MG/ML (PF) SYRINGE
PREFILLED_SYRINGE | INTRAVENOUS | Status: AC
  Filled 2020-10-29: qty 10

## 2020-10-29 MED ORDER — ONDANSETRON HCL 4 MG/2ML IJ SOLN
INTRAMUSCULAR | Status: DC | PRN
  Administered 2020-10-29: 4 mg via INTRAVENOUS

## 2020-10-29 SURGICAL SUPPLY — 70 items
ADH SKN CLS APL DERMABOND .7 (GAUZE/BANDAGES/DRESSINGS)
ANCH SUT SWLK 24.5 SLF PNCH VT (Anchor) ×4 IMPLANT
ANCHOR BIOCOMP SWIVELOCK (Anchor) ×6 IMPLANT
APL SKNCLS STERI-STRIP NONHPOA (GAUZE/BANDAGES/DRESSINGS)
BENZOIN TINCTURE PRP APPL 2/3 (GAUZE/BANDAGES/DRESSINGS) IMPLANT
BLADE EXCALIBUR 4.0X13 (MISCELLANEOUS) ×3 IMPLANT
BLADE SURG 15 STRL LF DISP TIS (BLADE) IMPLANT
BLADE SURG 15 STRL SS (BLADE)
BNDG COHESIVE 4X5 TAN ST LF (GAUZE/BANDAGES/DRESSINGS) ×3 IMPLANT
BURR OVAL 8 FLU 4.0X13 (MISCELLANEOUS) ×3 IMPLANT
CANNULA 5.75X71 LONG (CANNULA) ×3 IMPLANT
CANNULA SHOULDER 7CM (CANNULA) ×3 IMPLANT
CANNULA TWIST IN 8.25X7CM (CANNULA) ×3 IMPLANT
COOLER ICEMAN CLASSIC (MISCELLANEOUS) ×3 IMPLANT
DECANTER SPIKE VIAL GLASS SM (MISCELLANEOUS) IMPLANT
DERMABOND ADVANCED (GAUZE/BANDAGES/DRESSINGS)
DERMABOND ADVANCED .7 DNX12 (GAUZE/BANDAGES/DRESSINGS) IMPLANT
DISSECTOR  3.8MM X 13CM (MISCELLANEOUS)
DISSECTOR 3.8MM X 13CM (MISCELLANEOUS) IMPLANT
DRAPE IMP U-DRAPE 54X76 (DRAPES) ×3 IMPLANT
DRAPE INCISE IOBAN 66X45 STRL (DRAPES) ×3 IMPLANT
DRAPE STERI 35X30 U-POUCH (DRAPES) ×3 IMPLANT
DRAPE SURG 17X23 STRL (DRAPES) ×3 IMPLANT
DRAPE U-SHAPE 47X51 STRL (DRAPES) ×3 IMPLANT
DRAPE U-SHAPE 76X120 STRL (DRAPES) ×6 IMPLANT
DRSG PAD ABDOMINAL 8X10 ST (GAUZE/BANDAGES/DRESSINGS) ×3 IMPLANT
DURAPREP 26ML APPLICATOR (WOUND CARE) ×6 IMPLANT
ELECT REM PT RETURN 9FT ADLT (ELECTROSURGICAL) ×3
ELECTRODE REM PT RTRN 9FT ADLT (ELECTROSURGICAL) ×2 IMPLANT
FIBER TAPE 2MM (SUTURE) IMPLANT
GAUZE SPONGE 4X4 12PLY STRL (GAUZE/BANDAGES/DRESSINGS) ×3 IMPLANT
GAUZE XEROFORM 1X8 LF (GAUZE/BANDAGES/DRESSINGS) ×3 IMPLANT
GLOVE SURG NEOP MICRO LF SZ7.5 (GLOVE) ×3 IMPLANT
GLOVE SURG SYN 7.5  E (GLOVE) ×2
GLOVE SURG SYN 7.5 E (GLOVE) ×4 IMPLANT
GLOVE SURG UNDER POLY LF SZ7 (GLOVE) ×3 IMPLANT
GLOVE SURG UNDER POLY LF SZ7.5 (GLOVE) ×3 IMPLANT
GOWN STRL REIN XL XLG (GOWN DISPOSABLE) ×6 IMPLANT
GOWN STRL REUS W/ TWL LRG LVL3 (GOWN DISPOSABLE) ×4 IMPLANT
GOWN STRL REUS W/ TWL XL LVL3 (GOWN DISPOSABLE) IMPLANT
GOWN STRL REUS W/TWL LRG LVL3 (GOWN DISPOSABLE) ×6
GOWN STRL REUS W/TWL XL LVL3 (GOWN DISPOSABLE)
MANIFOLD NEPTUNE II (INSTRUMENTS) ×3 IMPLANT
NEEDLE SCORPION MULTI FIRE (NEEDLE) ×3 IMPLANT
PACK ARTHROSCOPY DSU (CUSTOM PROCEDURE TRAY) ×3 IMPLANT
PACK BASIN DAY SURGERY FS (CUSTOM PROCEDURE TRAY) ×3 IMPLANT
PAD COLD SHLDR WRAP-ON (PAD) ×3 IMPLANT
PORT APPOLLO RF 90DEGREE MULTI (SURGICAL WAND) ×3 IMPLANT
SHEET MEDIUM DRAPE 40X70 STRL (DRAPES) ×3 IMPLANT
SLEEVE SCD COMPRESS KNEE MED (STOCKING) ×3 IMPLANT
SLING ARM FOAM STRAP LRG (SOFTGOODS) IMPLANT
SLING ARM FOAM STRAP XLG (SOFTGOODS) ×3 IMPLANT
STRIP CLOSURE SKIN 1/2X4 (GAUZE/BANDAGES/DRESSINGS) IMPLANT
SUT ETHILON 3 0 PS 1 (SUTURE) ×6 IMPLANT
SUT FIBERWIRE #2 38 T-5 BLUE (SUTURE)
SUT MNCRL AB 4-0 PS2 18 (SUTURE) ×3 IMPLANT
SUT PDS AB 1 CT  36 (SUTURE)
SUT PDS AB 1 CT 36 (SUTURE) IMPLANT
SUT TIGER TAPE 7 IN WHITE (SUTURE) IMPLANT
SUT VIC AB 2-0 CT1 27 (SUTURE) ×3
SUT VIC AB 2-0 CT1 TAPERPNT 27 (SUTURE) ×2 IMPLANT
SUTURE FIBERWR #2 38 T-5 BLUE (SUTURE) IMPLANT
SUTURE TAPE 1.3 40 TPR END (SUTURE) IMPLANT
SUTURE TAPE TIGERLINK 1.3MM BL (SUTURE) ×2 IMPLANT
SUTURETAPE 1.3 40 TPR END (SUTURE)
SUTURETAPE TIGERLINK 1.3MM BL (SUTURE) ×3
SYR 50ML LL SCALE MARK (SYRINGE) ×3 IMPLANT
TOWEL GREEN STERILE FF (TOWEL DISPOSABLE) ×3 IMPLANT
TUBE CONNECTING 20X1/4 (TUBING) IMPLANT
TUBING ARTHROSCOPY IRRIG 16FT (MISCELLANEOUS) IMPLANT

## 2020-10-29 NOTE — Discharge Instructions (Addendum)
Post-operative patient instructions  Shoulder Arthroscopy   Ice:  Place intermittent ice or cooler pack over your shoulder, 30 minutes on and 30 minutes off.  Continue this for the first 72 hours after surgery, then save ice for use after therapy sessions or on more active days.   Weight:  You may NOT bear weight on your arm as your symptoms allow. Motion:  Perform gentle shoulder motion as tolerated Dressing:  Perform 1st dressing change at 2 days postoperative. A moderate amount of blood tinged drainage is to be expected.  So if you bleed through the dressing on the first or second day or if you have fevers, it is fine to change the dressing/check the wounds early and redress wound.  If it bleeds through again, or if the incisions are leaking frank blood, please call the office. May change dressing every 1-2 days thereafter to help watch wounds. Can purchase Tegaderm (or 28M Nexcare) water resistant dressings at local pharmacy / [facility]. Shower:  Light shower is ok after 2 days.  Please take shower, NO bath. Recover with gauze and ace wrap to help keep wounds protected.   Pain medication:  A narcotic pain medication has been prescribed.  Take as directed.  Typically you need narcotic pain medication more regularly during the first 3 to 5 days after surgery.  Decrease your use of the medication as the pain improves.  Narcotics can sometimes cause constipation, even after a few doses.  If you have problems with constipation, you can take an over the counter stool softener or light laxative.  If you have persistent problems, please notify your physician's office. Physical therapy: Additional activity guidelines to be provided by your physician or physical therapist at follow-up visits.  Driving: Do not recommend driving x 2 weeks post surgical, especially if surgery performed on right side. Should not drive while taking narcotic pain medications. It typically takes at least 2 weeks to restore  sufficient neuromuscular function for normal reaction times for driving safety.  Call [phone] for questions or problems. Evenings you will be forwarded to the hospital operator.  Ask for the orthopaedic physician on call. Please call if you experience:    Redness, foul smelling, or persistent drainage from the surgical site  worsening shoulder pain and swelling not responsive to medication  any calf pain and or swelling of the lower leg  temperatures greater than 101.5 F other questions or concerns   Thank you for allowing [place] to be a part of your care.      Post Anesthesia Home Care Instructions  Activity: Get plenty of rest for the remainder of the day. A responsible individual must stay with you for 24 hours following the procedure.  For the next 24 hours, DO NOT: -Drive a car -Paediatric nurse -Drink alcoholic beverages -Take any medication unless instructed by your physician -Make any legal decisions or sign important papers.  Meals: Start with liquid foods such as gelatin or soup. Progress to regular foods as tolerated. Avoid greasy, spicy, heavy foods. If nausea and/or vomiting occur, drink only clear liquids until the nausea and/or vomiting subsides. Call your physician if vomiting continues.  Special Instructions/Symptoms: Your throat may feel dry or sore from the anesthesia or the breathing tube placed in your throat during surgery. If this causes discomfort, gargle with warm salt water. The discomfort should disappear within 24 hours.  If you had a scopolamine patch placed behind your ear for the management of post- operative nausea  and/or vomiting:  1. The medication in the patch is effective for 72 hours, after which it should be removed.  Wrap patch in a tissue and discard in the trash. Wash hands thoroughly with soap and water. 2. You may remove the patch earlier than 72 hours if you experience unpleasant side effects which may include dry mouth, dizziness or  visual disturbances. 3. Avoid touching the patch. Wash your hands with soap and water after contact with the patch.          Regional Anesthesia Blocks  1. Numbness or the inability to move the "blocked" extremity may last from 3-48 hours after placement. The length of time depends on the medication injected and your individual response to the medication. If the numbness is not going away after 48 hours, call your surgeon.  2. The extremity that is blocked will need to be protected until the numbness is gone and the  Strength has returned. Because you cannot feel it, you will need to take extra care to avoid injury. Because it may be weak, you may have difficulty moving it or using it. You may not know what position it is in without looking at it while the block is in effect.  3. For blocks in the legs and feet, returning to weight bearing and walking needs to be done carefully. You will need to wait until the numbness is entirely gone and the strength has returned. You should be able to move your leg and foot normally before you try and bear weight or walk. You will need someone to be with you when you first try to ensure you do not fall and possibly risk injury.  4. Bruising and tenderness at the needle site are common side effects and will resolve in a few days.  5. Persistent numbness or new problems with movement should be communicated to the surgeon or the [facility] [phone] [place] [phone]).     Information for Discharge Teaching: EXPAREL (bupivacaine liposome injectable suspension)   Your surgeon or anesthesiologist gave you EXPAREL(bupivacaine) to help control your pain after surgery.  EXPAREL is a local anesthetic that provides pain relief by numbing the tissue around the surgical site. EXPAREL is designed to release pain medication over time and can control pain for up to 72 hours. Depending on how you respond to EXPAREL, you may  require less pain medication during your recovery.  Possible side effects: Temporary loss of sensation or ability to move in the area where bupivacaine was injected. Nausea, vomiting, constipation Rarely, numbness and tingling in your mouth or lips, lightheadedness, or anxiety may occur. Call your doctor right away if you think you may be experiencing any of these sensations, or if you have other questions regarding possible side effects.  Follow all other discharge instructions given to you by your surgeon or nurse. Eat a healthy diet and drink plenty of water or other fluids.  If you return to the hospital for any reason within 96 hours following the administration of EXPAREL, it is important for health care providers to know that you have received this anesthetic. A teal colored band has been placed on your arm with the date, time and amount of EXPAREL you have received in order to alert and inform your health care providers. Please leave this armband in place for the full 96 hours following administration, and then you may remove the band.

## 2020-10-29 NOTE — Op Note (Signed)
   Date of Surgery: 10/29/2020  INDICATIONS: The patient is a 45 year old male with right shoulder pain that has failed conservative treatment;  The patient did consent to the procedure after discussion of the risks and benefits.  PREOPERATIVE DIAGNOSIS:  1.  Right shoulder impingement syndrome 2.  Right AC joint arthrosis 3.  Right shoulder degenerative SLAP tear 4.  Right shoulder rotator cuff tendinosis 5.  Right shoulder glenohumeral chondromalacia  POSTOPERATIVE DIAGNOSIS: Same.  PROCEDURE:  1.  Right shoulder arthroscopic biceps tenodesis 2.  Right shoulder arthroscopic distal clavicle excision 3.  Right shoulder arthroscopic extensive debridement of labrum, biceps, glenohumeral surface, synovitis, articular and bursal surfaces of rotator cuff 4.  Right shoulder arthroscopic subacromial decompression with acromioplasty  SURGEON: N. Eduard Roux, M.D.  ASSIST: Madalyn Rob, PA-C  ANESTHESIA:  general, regional  IV FLUIDS AND URINE: See anesthesia.  ESTIMATED BLOOD LOSS: minimal mL.  IMPLANTS: Arthrex 4.75 mm swivel lock  COMPLICATIONS: None.  DESCRIPTION OF PROCEDURE: The patient was brought to the operating room and placed supine on the operating table.  The patient had been signed prior to the procedure and this was documented. The patient had the anesthesia placed by the anesthesiologist.  A time-out was performed to confirm that this was the correct patient, site, side and location. The patient did receive antibiotics prior to the incision and was re-dosed during the procedure as needed at indicated intervals.  The patient was then positioned into the beach chair position with all bony prominences well padded and neutral C spine. The patient had the operative extremity prepped and draped in the standard surgical fashion.    Incisions were made for standard shoulder arthroscopy portals.  Diagnostic shoulder arthroscopy was first performed which revealed a degenerative  labral and SLAP tear extending from 9:00 to 12:00.  The biceps tendon itself did not show any pathology.  There was a mild amount of synovitis throughout the shoulder joint.  There is mild chondromalacia of the glenohumeral surface.  All of these areas were gently debrided.  The articular surface of the rotator cuff showed minimal tendinosis.  Arthroscopic biceps tenodesis was then performed using the loop intact technique which was then anchored into the bicipital groove with a 4.75 mm swivel lock.  The stump of the biceps tendon was debrided down.  The labrum was debrided back to stable margins.  The arthroscope was then repositioned into the subacromial space.  Subacromial and subdeltoid bursectomy was performed.  We evaluated the bursal surface of the rotator cuff extensively and it showed mild tendinopathy towards the anterior margin of the supraspinatus.  No full-thickness tears could be identified.  Patient did have a large distal clavicle inferior osteophyte which I felt was causing impingement of the superior rotator cuff.  Distal clavicle excision was performed taking approximately a centimeter distal clavicle.  The superior AC ligament was left intact.  Acromioplasty and subacromial decompression was performed as well.  CA ligament release was performed.  After completion of the surgery the incisions were closed with interrupted nylon sutures.  Sterile dressings were applied.  Shoulder sling was placed.  Patient tolerated procedure well had no me complications.  Tawanna Cooler, my PA, was a medical necessity for the entirety of the surgery including opening, closing, limb positioning, retracting, exposing, and repairing.  POSTOPERATIVE PLAN: Patient will discharge home and follow-up in a week for suture removal.  Azucena Cecil, MD [facility] [phone] 7:35 PM

## 2020-10-29 NOTE — Anesthesia Procedure Notes (Signed)
Procedure Name: Intubation Date/Time: 10/29/2020 12:15 PM Performed by: Signe Colt, CRNA Pre-anesthesia Checklist: Patient identified, Emergency Drugs available, Suction available and Patient being monitored Patient Re-evaluated:Patient Re-evaluated prior to induction Oxygen Delivery Method: Circle system utilized Preoxygenation: Pre-oxygenation with 100% oxygen Induction Type: IV induction Ventilation: Mask ventilation without difficulty Laryngoscope Size: Mac and 4 Grade View: Grade II Tube type: Oral Tube size: 7.5 mm Number of attempts: 2 Airway Equipment and Method: Stylet and Oral airway Placement Confirmation: ETT inserted through vocal cords under direct vision, positive ETCO2 and breath sounds checked- equal and bilateral Secured at: 22 cm Tube secured with: Tape Dental Injury: Teeth and Oropharynx as per pre-operative assessment

## 2020-10-29 NOTE — Progress Notes (Signed)
Assisted Dr. Rose with right, ultrasound guided, interscalene  block. Side rails up, monitors on throughout procedure. See vital signs in flow sheet. Tolerated Procedure well. 

## 2020-10-29 NOTE — H&P (Signed)
PREOPERATIVE H&P  Chief Complaint: right shoulder supraspinatus tear, acromioclavicular arthritis, impingement, labral tear  HPI: Luis Lopez is a 45 y.o. male who presents for surgical treatment of right shoulder supraspinatus tear, acromioclavicular arthritis, impingement, labral tear.  He denies any changes in medical history.  Past Medical History:  Diagnosis Date   Diabetes mellitus without complication ([place])    Hypertension    borderline   PVC (premature ventricular contraction) 04/29/2018   Sleep apnea    uses a cpap   Snoring    Past Surgical History:  Procedure Laterality Date   INSERTION OF MESH N/A 11/03/2017   Procedure: INSERTION OF MESH;  Surgeon: Erroll Luna, MD;  Location: [facility];  Service: General;  Laterality: N/A;   UMBILICAL HERNIA REPAIR  [id number]   UMBILICAL HERNIA REPAIR N/A 11/03/2017   Procedure: UMBILICAL HERNIA REPAIR WITH MESH;  Surgeon: Erroll Luna, MD;  Location: [facility];  Service: General;  Laterality: N/A;   Social History   Socioeconomic History   Marital status: Married    Spouse name: Not on file   Number of children: Not on file   Years of education: Not on file   Highest education level: Not on file  Occupational History   Not on file  Tobacco Use   Smoking status: Former    Types: Cigarettes    Quit date: 03/23/2003    Years since quitting: 17.6   Smokeless tobacco: Never  Vaping Use   Vaping Use: Never used  Substance and Sexual Activity   Alcohol use: No   Drug use: No   Sexual activity: Yes    Birth control/protection: None  Other Topics Concern   Not on file  Social History Narrative   Not on file   Social Determinants of Health   Financial Resource Strain: Low Risk    Difficulty of Paying Living Expenses: Not hard at all  Food Insecurity: No Food Insecurity   Worried About Charity fundraiser in the Last Year: Never true   [place] in the Last Year: Never true  Transportation Needs: No  Transportation Needs   Lack of Transportation (Medical): No   Lack of Transportation (Non-Medical): No  Physical Activity: Sufficiently Active   Days of Exercise per Week: 3 days   Minutes of Exercise per Session: 120 min  Stress: No Stress Concern Present   Feeling of Stress : Not at all  Social Connections: Not on file   Family History  Problem Relation Age of Onset   Diabetes Father    Diabetes Brother    Hypertension Brother    Diabetes Sister    Hypertension Sister    Aneurysm Paternal Grandmother        brain   No Known Allergies Prior to Admission medications   Medication Sig Start Date End Date Taking? Authorizing Provider  aspirin EC 81 MG tablet Take 81 mg by mouth daily.   Yes [provider]  hydrochlorothiazide (HYDRODIURIL) 25 MG tablet Take 1 tablet (25 mg total) by mouth daily. 10/21/20  Yes [name], FNP  ibuprofen (ADVIL) 800 MG tablet Take 1 tablet (800 mg total) by mouth 3 (three) times daily. 08/23/20  Yes Hazel Sams, PA-C  losartan (COZAAR) 25 MG tablet TAKE 1 TABLET(25 MG) BY MOUTH DAILY 10/21/20  Yes [name], FNP  metFORMIN (GLUCOPHAGE) 500 MG tablet Take 1 tablet (500 mg total) by mouth 2 (two) times daily with a meal. 06/25/20 06/25/21 Yes Moore,  Janece, FNP  OZEMPIC, 0.25 OR 0.5 MG/DOSE, 2 MG/1.5ML SOPN INJECT 0.5 MG INTO THE SKIN ONCE A WEEK 10/06/20  Yes [name], FNP  Glucagon (GVOKE HYPOPEN 2-PACK) 0.5 MG/0.1ML SOAJ Inject 0.5 mg into the skin as needed. 09/03/20   [name], FNP  UNABLE TO FIND CPAP: At bedtime    [provider]     Positive ROS: All other systems have been reviewed and were otherwise negative with the exception of those mentioned in the HPI and as above.  Physical Exam: General: Alert, no acute distress Cardiovascular: No pedal edema Respiratory: No cyanosis, no use of accessory musculature GI: abdomen soft Skin: No lesions in the area of chief complaint Neurologic: Sensation intact  distally Psychiatric: Patient is competent for consent with normal mood and affect Lymphatic: no lymphedema  MUSCULOSKELETAL: exam stable  Assessment: right shoulder supraspinatus tear, acromioclavicular arthritis, impingement, labral tear  Plan: Plan for Procedure(s): RIGHT SHOULDER ARTHROSCOPY, EXTENSIVE DEBRIDEMENT, DISTAL CLAVICLE EXCISION, SUBACROMIAL DECOMPRESSION, POSSIBLE ROTATOR CUFF REPAIR AND BICEPS TENODESIS  The risks benefits and alternatives were discussed with the patient including but not limited to the risks of nonoperative treatment, versus surgical intervention including infection, bleeding, nerve injury,  blood clots, cardiopulmonary complications, morbidity, mortality, among others, and they were willing to proceed.   Preoperative templating of the joint replacement has been completed, documented, and submitted to the Operating Room personnel in order to optimize intra-operative equipment management.   Eduard Roux, MD 10/29/2020 10:14 AM

## 2020-10-29 NOTE — Anesthesia Preprocedure Evaluation (Signed)
Anesthesia Evaluation  Patient identified by MRN, date of birth, ID band Patient awake    Reviewed: Allergy & Precautions, NPO status , Patient's Chart, lab work & pertinent test results  Airway Mallampati: II  TM Distance: >3 FB Neck ROM: Full    Dental no notable dental hx.    Pulmonary sleep apnea and Continuous Positive Airway Pressure Ventilation , former smoker,    Pulmonary exam normal breath sounds clear to auscultation       Cardiovascular hypertension, Normal cardiovascular exam Rhythm:Regular Rate:Normal     Neuro/Psych negative neurological ROS  negative psych ROS   GI/Hepatic negative GI ROS, Neg liver ROS,   Endo/Other  diabetesMorbid obesity  Renal/GU negative Renal ROS  negative genitourinary   Musculoskeletal negative musculoskeletal ROS (+)   Abdominal   Peds negative pediatric ROS (+)  Hematology negative hematology ROS (+)   Anesthesia Other Findings   Reproductive/Obstetrics negative OB ROS                             Anesthesia Physical Anesthesia Plan  ASA: 3  Anesthesia Plan: General   Post-op Pain Management:  Regional for Post-op pain   Induction: Intravenous  PONV Risk Score and Plan: 2 and Ondansetron, Dexamethasone and Treatment may vary due to age or medical condition  Airway Management Planned: Oral ETT  Additional Equipment:   Intra-op Plan:   Post-operative Plan: Extubation in OR  Informed Consent: I have reviewed the patients History and Physical, chart, labs and discussed the procedure including the risks, benefits and alternatives for the proposed anesthesia with the patient or authorized representative who has indicated his/her understanding and acceptance.     Dental advisory given  Plan Discussed with: CRNA and Surgeon  Anesthesia Plan Comments:         Anesthesia Quick Evaluation

## 2020-10-29 NOTE — Anesthesia Procedure Notes (Signed)
Anesthesia Procedure Image    

## 2020-10-29 NOTE — Transfer of Care (Signed)
Immediate Anesthesia Transfer of Care Note  Patient: Luis Lopez  Procedure(s) Performed: RIGHT SHOULDER ARTHROSCOPY, EXTENSIVE DEBRIDEMENT, DISTAL CLAVICLE EXCISION, SUBACROMIAL DECOMPRESSION, BICEPS TENODESIS (Right: Shoulder)  Patient Location: PACU  Anesthesia Type:GA combined with regional for post-op pain  Level of Consciousness: drowsy and patient cooperative  Airway & Oxygen Therapy: Patient Spontanous Breathing and Patient connected to face mask oxygen  Post-op Assessment: Report given to RN and Post -op Vital signs reviewed and stable  Post vital signs: Reviewed and stable  Last Vitals:  Vitals Value Taken Time  BP    Temp    Pulse 99 10/29/20 1428  Resp 17 10/29/20 1428  SpO2 93 % 10/29/20 1428    Last Pain:  Vitals:   10/29/20 1027  TempSrc: Oral  PainSc: 0-No pain         Complications: No notable events documented.

## 2020-10-29 NOTE — Anesthesia Procedure Notes (Signed)
Anesthesia Regional Block: Interscalene brachial plexus block   Pre-Anesthetic Checklist: , timeout performed,  Correct Patient, Correct Site, Correct Laterality,  Correct Procedure, Correct Position, site marked,  Risks and benefits discussed,  Surgical consent,  Pre-op evaluation,  At surgeon's request and post-op pain management  Laterality: Right  Prep: chloraprep       Needles:  Injection technique: Single-shot  Needle Type: Echogenic Needle     Needle Length: 9cm      Additional Needles:   Procedures:, nerve stimulator,,, ultrasound used (permanent image in chart),,     Nerve Stimulator or Paresthesia:  Response: 0.5 mA  Additional Responses:   Narrative:  Start time: 10/29/2020 11:26 AM End time: 10/29/2020 11:35 AM Injection made incrementally with aspirations every 5 mL.  Performed by: Personally  Anesthesiologist: Myrtie Soman, MD  Additional Notes: Patient tolerated the procedure well without complications

## 2020-10-30 ENCOUNTER — Encounter (HOSPITAL_BASED_OUTPATIENT_CLINIC_OR_DEPARTMENT_OTHER): Payer: Self-pay | Admitting: Orthopaedic Surgery

## 2020-10-30 NOTE — Anesthesia Postprocedure Evaluation (Signed)
Anesthesia Post Note  Patient: Luis Lopez  Procedure(s) Performed: RIGHT SHOULDER ARTHROSCOPY, EXTENSIVE DEBRIDEMENT, DISTAL CLAVICLE EXCISION, SUBACROMIAL DECOMPRESSION, BICEPS TENODESIS (Right: Shoulder)     Patient location during evaluation: PACU Anesthesia Type: General Level of consciousness: awake and alert Pain management: pain level controlled Vital Signs Assessment: post-procedure vital signs reviewed and stable Respiratory status: spontaneous breathing, nonlabored ventilation, respiratory function stable and patient connected to nasal cannula oxygen Cardiovascular status: blood pressure returned to baseline and stable Postop Assessment: no apparent nausea or vomiting Anesthetic complications: no   No notable events documented.  Last Vitals:  Vitals:   10/29/20 1500 10/29/20 1523  BP: (!) 146/87 (!) 158/98  Pulse: 84 86  Resp: 12 14  Temp:  36.7 C  SpO2: 96% 92%    Last Pain:  Vitals:   10/30/20 0900  TempSrc:   PainSc: 0-No pain                 Tris Howell S

## 2020-11-04 ENCOUNTER — Other Ambulatory Visit: Payer: Self-pay

## 2020-11-07 ENCOUNTER — Ambulatory Visit (INDEPENDENT_AMBULATORY_CARE_PROVIDER_SITE_OTHER): Payer: 59 | Admitting: Surgical

## 2020-11-07 ENCOUNTER — Encounter: Payer: Self-pay | Admitting: Surgical

## 2020-11-07 ENCOUNTER — Other Ambulatory Visit: Payer: Self-pay

## 2020-11-07 DIAGNOSIS — M75101 Unspecified rotator cuff tear or rupture of right shoulder, not specified as traumatic: Secondary | ICD-10-CM

## 2020-11-07 DIAGNOSIS — M7541 Impingement syndrome of right shoulder: Secondary | ICD-10-CM

## 2020-11-07 DIAGNOSIS — Z9889 Other specified postprocedural states: Secondary | ICD-10-CM

## 2020-11-07 NOTE — Progress Notes (Signed)
Post-Op Visit Note   Patient: Luis Lopez           Date of Birth: 10/29/1975           MRN: [id number] Visit Date: 11/07/2020 PCP: [name], FNP   Assessment & Plan:  Chief Complaint:  Chief Complaint  Patient presents with   Right Shoulder - Routine Post Op   Visit Diagnoses:  1. Impingement syndrome of right shoulder     Plan: Patient is a 45 year old male who presents s/p right shoulder arthroscopy by Dr. Erlinda Hong on 10/29/2020.  Reviewed patient op note which showed patient had biceps tenodesis with loop and tack as well as arthroscopic DCE, extensive debridement, subacromial decompression but no actual rotator cuff repair.  He is currently ambulating with sling.  Reports 4/10 pain.  Only has to take oxycodone twice a day as needed.  No concerns at this time by the patient.  Questions were answered to his satisfaction.  On exam he has well-healing incisions without any evidence of infection or dehiscence.  Excellent bicep flexion and supination strength with no Popeye deformity.  Axillary nerve intact with deltoid firing.  He has 15 degrees external rotation, 60 degrees abduction, 90 degrees forward flexion.  Shoulder is fairly stiff especially with external rotation so he may be developing a little bit of adhesive capsulitis rather than typical postoperative swelling.  Plan to start physical therapy with Rob upstairs as [facility] family has had good experience with him.  Okay for passive and active range of motion of the right shoulder as well as deltoid isometrics but avoid passive and active shoulder extension specifically and avoid bicep strengthening exercises.  He will hold off on lifting.  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  Follow-up in 3 weeks for clinical recheck with Dr. Erlinda Hong or Mendel Ryder.  Follow-Up Instructions: No follow-ups on file.   Orders:  No orders of the defined types were placed in this encounter.  No orders of the defined types were placed in this  encounter.   Imaging: No results found.  PMFS History: Patient Active Problem List   Diagnosis Date Noted   Nontraumatic tear of supraspinatus tendon, right 10/29/2020   Degenerative superior labral anterior-to-posterior (SLAP) tear of right shoulder 10/29/2020   Arthrosis of right acromioclavicular joint 10/29/2020   Muscle spasm 07/26/2018   Acute bilateral low back pain without sciatica 07/26/2018   PVC (premature ventricular contraction) 04/29/2018   Abnormal EKG 03/01/2018   Essential hypertension XX123456   Umbilical hernia 123XX123   Morbid obesity due to excess calories ([place]) 11/06/2015   Nocturia more than twice per night 11/06/2015   Sleep deprivation 11/06/2015   Hypersomnia with sleep apnea 11/06/2015   OSA (obstructive sleep apnea) 11/06/2015   Past Medical History:  Diagnosis Date   Diabetes mellitus without complication ([place])    Hypertension    borderline   PVC (premature ventricular contraction) 04/29/2018   Sleep apnea    uses a cpap   Snoring     Family History  Problem Relation Age of Onset   Diabetes Father    Diabetes Brother    Hypertension Brother    Diabetes Sister    Hypertension Sister    Aneurysm Paternal Grandmother        brain    Past Surgical History:  Procedure Laterality Date   INSERTION OF MESH N/A 11/03/2017   Procedure: INSERTION OF MESH;  Surgeon: Erroll Luna, MD;  Location: [place];  Service: General;  Laterality: N/A;   SHOULDER ACROMIOPLASTY Right 10/29/2020   Procedure: RIGHT SHOULDER ARTHROSCOPY, EXTENSIVE DEBRIDEMENT, DISTAL CLAVICLE EXCISION, SUBACROMIAL DECOMPRESSION, BICEPS TENODESIS;  Surgeon: Leandrew Koyanagi, MD;  Location: [place];  Service: Orthopedics;  Laterality: Right;   UMBILICAL HERNIA REPAIR  [id number]   UMBILICAL HERNIA REPAIR N/A 11/03/2017   Procedure: UMBILICAL HERNIA REPAIR WITH MESH;  Surgeon: Erroll Luna, MD;  Location: [place];  Service: General;  Laterality: N/A;   Social  History   Occupational History   Not on file  Tobacco Use   Smoking status: Former    Types: Cigarettes    Quit date: 03/23/2003    Years since quitting: 17.6   Smokeless tobacco: Never  Vaping Use   Vaping Use: Never used  Substance and Sexual Activity   Alcohol use: No   Drug use: No   Sexual activity: Yes    Birth control/protection: None

## 2020-11-07 NOTE — Addendum Note (Signed)
Addended by: Melton Alar on: 11/07/2020 01:36 PM   Modules accepted: Orders

## 2020-11-13 ENCOUNTER — Other Ambulatory Visit: Payer: Self-pay

## 2020-11-13 ENCOUNTER — Other Ambulatory Visit: Payer: Self-pay | Admitting: Orthopaedic Surgery

## 2020-11-13 ENCOUNTER — Encounter: Payer: Self-pay | Admitting: Physical Therapy

## 2020-11-13 ENCOUNTER — Ambulatory Visit (INDEPENDENT_AMBULATORY_CARE_PROVIDER_SITE_OTHER): Payer: 59 | Admitting: Physical Therapy

## 2020-11-13 DIAGNOSIS — R600 Localized edema: Secondary | ICD-10-CM

## 2020-11-13 DIAGNOSIS — M25511 Pain in right shoulder: Secondary | ICD-10-CM | POA: Diagnosis not present

## 2020-11-13 DIAGNOSIS — M6281 Muscle weakness (generalized): Secondary | ICD-10-CM | POA: Diagnosis not present

## 2020-11-13 DIAGNOSIS — M25611 Stiffness of right shoulder, not elsewhere classified: Secondary | ICD-10-CM | POA: Diagnosis not present

## 2020-11-13 NOTE — Addendum Note (Signed)
Addended by: Elsie Ra R on: 11/13/2020 12:12 PM   Modules accepted: Orders

## 2020-11-13 NOTE — Telephone Encounter (Signed)
He needs to call his surgeon for the pain medication

## 2020-11-13 NOTE — Patient Instructions (Signed)
Access Code: [email or web address] URL: [email or web address] Date: 11/13/2020 Prepared by: Elsie Ra  Exercises Seated Shoulder Flexion AAROM with Pulley Behind - 2 x daily - 6 x weekly - 2 sets - 20 reps - 2-5 sec hold Seated Shoulder Scaption AAROM with Pulley at Side - 2 x daily - 6 x weekly - 2 sets - 20 reps - 2-5 sec hold Circular Shoulder Pendulum with Table Support - 3 x daily - 7 x weekly - 2 sets - 10 reps Supine Shoulder Flexion Extension AAROM with Dowel - 3 x daily - 7 x weekly - 2-3 sets - 10 reps Supine Shoulder Abduction AAROM with Dowel - 2 x daily - 6 x weekly - 2-3 sets - 10 reps Supine Shoulder External Rotation in 45 Degrees Abduction AAROM with Dowel - 3 x daily - 7 x weekly - 2-3 sets - 10 reps Supine Shoulder Internal Rotation Stretch - 2 x daily - 6 x weekly - 3 sets - 10 reps

## 2020-11-13 NOTE — Therapy (Signed)
[facility] Physical Therapy [address] [place], [place], [address] Phone: [phone]   Fax:  [phone]  Physical Therapy Evaluation  Patient Details  Name: Luis Lopez MRN: [id number] Date of Birth: 06-12-1975 Referring Provider (PT): Erlinda Hong, MD   Encounter Date: 11/13/2020   PT End of Session - 11/13/20 1201     Visit Number 1    Number of Visits 18    Date for PT Re-Evaluation 01/08/21    PT Start Time 1100    PT Stop Time 1145    PT Time Calculation (min) 45 min    Activity Tolerance Patient tolerated treatment well;Patient limited by pain    Behavior During Therapy [facility] for tasks assessed/performed             Past Medical History:  Diagnosis Date   Diabetes mellitus without complication ([place])    Hypertension    borderline   PVC (premature ventricular contraction) 04/29/2018   Sleep apnea    uses a cpap   Snoring     Past Surgical History:  Procedure Laterality Date   INSERTION OF MESH N/A 11/03/2017   Procedure: INSERTION OF MESH;  Surgeon: Erroll Luna, MD;  Location: [place];  Service: General;  Laterality: N/A;   SHOULDER ACROMIOPLASTY Right 10/29/2020   Procedure: RIGHT SHOULDER ARTHROSCOPY, EXTENSIVE DEBRIDEMENT, DISTAL CLAVICLE EXCISION, SUBACROMIAL DECOMPRESSION, BICEPS TENODESIS;  Surgeon: Leandrew Koyanagi, MD;  Location: [place];  Service: Orthopedics;  Laterality: Right;   UMBILICAL HERNIA REPAIR  [id number]   UMBILICAL HERNIA REPAIR N/A 11/03/2017   Procedure: UMBILICAL HERNIA REPAIR WITH MESH;  Surgeon: Erroll Luna, MD;  Location: [place];  Service: General;  Laterality: N/A;    There were no vitals filed for this visit.    Subjective Assessment - 11/13/20 1108     Subjective presents s/p right shoulder arthroscopy biceps tenodesis with loop and tack as well as arthroscopic DCE, extensive debridement, subacromial decompression but no actual rotator cuff repair. by Dr. Erlinda Hong on 10/29/2020. he relays doing well  with pain but stiffness is his biggest complaint and rates this as 9/10 stiffness    Pertinent History s/p right shoulder arthroscopy biceps tenodesis with loop and tack as well as arthroscopic DCE, extensive debridement, subacromial decompression but no actual rotator cuff repair. by Dr. Erlinda Hong on 10/29/2020.    Limitations Lifting;House hold activities    Patient Stated Goals get back to normal    Currently in Pain? Yes    Pain Score 4     Pain Location Shoulder    Pain Orientation Right    Pain Descriptors / Indicators Aching    Pain Type Surgical pain    Pain Radiating Towards denies N/T    Pain Onset More than a month ago    Pain Frequency Intermittent    Multiple Pain Sites No                [facility] PT Assessment - 11/13/20 0001       Assessment   Medical Diagnosis s/p right shoulder arthroscopy biceps tenodesis with loop and tack as well as arthroscopic DCE, extensive debridement, subacromial decompression but no actual rotator cuff repair.    Referring Provider (PT) Erlinda Hong, MD    Onset Date/Surgical Date 10/29/20    Hand Dominance Right    Next MD Visit 11/28/20    Prior Therapy none      Precautions   Precaution Comments MD notes say "Okay for passive and active range of motion of  the right shoulder as well as deltoid isometrics but avoid passive and active shoulder extension specifically and avoid bicep strengthening exercises.  He will hold off on lifting"      Balance Screen   Has the patient fallen in the past 6 months No    Has the patient had a decrease in activity level because of a fear of falling?  No    Is the patient reluctant to leave their home because of a fear of falling?  No      Prior Function   Level of Independence Independent    Vocation Full time employment    Lexicographer      Cognition   Overall Cognitive Status Within Functional Limits for tasks assessed      Observation/Other Assessments   Focus on Therapeutic Outcomes  (FOTO)  41% functional, goal 70%      ROM / Strength   AROM / PROM / Strength AROM;PROM;Strength      AROM   Overall AROM Comments in supine    AROM Assessment Site Shoulder    Right/Left Shoulder Right    Right Shoulder Flexion 90 Degrees    Right Shoulder ABduction 80 Degrees    Right Shoulder Internal Rotation 40 Degrees    Right Shoulder External Rotation 20 Degrees      PROM   PROM Assessment Site Shoulder    Right/Left Shoulder Right    Right Shoulder Flexion 105 Degrees    Right Shoulder ABduction 90 Degrees    Right Shoulder Internal Rotation 50 Degrees    Right Shoulder External Rotation 20 Degrees      Strength   Strength Assessment Site Shoulder    Right/Left Shoulder Right    Right Shoulder Flexion 2/5    Right Shoulder ABduction 2/5    Right Shoulder Internal Rotation 3/5    Right Shoulder External Rotation 2/5            Objective measurements completed on examination: See above findings.       [facility] Adult PT Treatment/Exercise - 11/13/20 0001       Exercises   Exercises Shoulder      Shoulder Exercises: Supine   External Rotation AAROM;10 reps    Internal Rotation AROM;10 reps    Flexion AAROM;10 reps    ABduction AAROM;10 reps      Shoulder Exercises: Standing   Other Standing Exercises pendulums X15 CW and CCW      Shoulder Exercises: Pulleys   Flexion 2 minutes    ABduction 2 minutes      Manual Therapy   Manual therapy comments Rt shoulder PROM to tolerance all planes                    PT Education - 11/13/20 1201     Education Details HEP,POC    Person(s) Educated Patient;Spouse    Methods Explanation;Demonstration;Verbal cues;Handout    Comprehension Verbalized understanding;Returned demonstration;Need further instruction              PT Short Term Goals - 11/13/20 1206       PT SHORT TERM GOAL #1   Title Pt will be I and compliant with HEP.    Time 4    Period Weeks    Status New    Target Date  12/11/20      PT SHORT TERM GOAL #2   Title Pt will improve PROM Rt shoulder  to [facility]  Time 4    Period Weeks    Status New               PT Long Term Goals - 11/13/20 1207       PT LONG TERM GOAL #1   Title Pt will improve Rt shoulder AROM to [facility].    Time 8    Period Weeks    Status New    Target Date 01/08/21      PT LONG TERM GOAL #2   Title Pt will improve Rt shoulder strength to 5/5 MMT to improve functional lifting    Time 8    Period Weeks    Status New      PT LONG TERM GOAL #3   Title Pt will improve FOTO score to 70% functional.    Time 8    Period Weeks    Status New      PT LONG TERM GOAL #4   Title Pt will I and compliant with final HEP.    Time 8    Period Weeks    Status New      PT LONG TERM GOAL #5   Title Pt will report less than 3/10 overall pain with reaching overhead and behind back and performing usual ADL's, and being able to return to bowling.    Time 8    Period Weeks    Status New                    Plan - 11/13/20 1202     Clinical Impression Statement He presents to PT with Rt shoulder stiffness, weakness, pain, and decreased use of his Rt UE s/p right shoulder arthroscopy biceps tenodesis with loop and tack as well as arthroscopic DCE, extensive debridement, subacromial decompression but no actual rotator cuff repair. by Dr. Erlinda Hong on 10/29/2020. He will benefit from skilled PT to address his functional impairments.    Personal Factors and Comorbidities Comorbidity 2    Comorbidities DM, HTN    Examination-Activity Limitations Lift;Bathing;Bed Mobility;Carry;Sleep    Examination-Participation Restrictions Cleaning;Occupation;Driving;Laundry    Stability/Clinical Decision Making Stable/Uncomplicated    Clinical Decision Making Low    Rehab Potential Good    PT Frequency 2x / week   2-3   PT Duration 8 weeks    PT Next Visit Plan ROM early focus to minimize frozen shoulder risk, MD notes say "Okay for passive and active  range of motion of the right shoulder as well as deltoid isometrics but avoid passive and active shoulder extension specifically and avoid bicep strengthening exercises.  He will hold off on lifting"    PT Home Exercise Plan Access Code: [email or web address]    Consulted and Agree with Plan of Care Patient;Family member/caregiver    Family Member Consulted wife             Patient will benefit from skilled therapeutic intervention in order to improve the following deficits and impairments:  Decreased activity tolerance, Decreased mobility, Decreased range of motion, Decreased strength, Increased edema, Increased muscle spasms, Impaired flexibility, Pain, Impaired UE functional use  Visit Diagnosis: Acute pain of right shoulder  Stiffness of right shoulder, not elsewhere classified  Muscle weakness (generalized)  Localized edema     Problem List Patient Active Problem List   Diagnosis Date Noted   Nontraumatic tear of supraspinatus tendon, right 10/29/2020   Degenerative superior labral anterior-to-posterior (SLAP) tear of right shoulder 10/29/2020   Arthrosis of right acromioclavicular joint 10/29/2020  Muscle spasm 07/26/2018   Acute bilateral low back pain without sciatica 07/26/2018   PVC (premature ventricular contraction) 04/29/2018   Abnormal EKG 03/01/2018   Essential hypertension XX123456   Umbilical hernia 123XX123   Morbid obesity due to excess calories ([place]) 11/06/2015   Nocturia more than twice per night 11/06/2015   Sleep deprivation 11/06/2015   Hypersomnia with sleep apnea 11/06/2015   OSA (obstructive sleep apnea) 11/06/2015    Debbe Odea, PT,DPT 11/13/2020, 12:10 PM  [facility] Physical Therapy [address] [place], [place], [address] Phone: [phone]   Fax:  [phone]  Name: Luis Lopez MRN: [id number] Date of Birth: December 08, 1975

## 2020-11-14 ENCOUNTER — Other Ambulatory Visit: Payer: Self-pay | Admitting: Physician Assistant

## 2020-11-14 NOTE — Telephone Encounter (Signed)
Sent in

## 2020-11-17 ENCOUNTER — Other Ambulatory Visit: Payer: Self-pay | Admitting: Physician Assistant

## 2020-11-17 MED ORDER — OXYCODONE-ACETAMINOPHEN 5-325 MG PO TABS: 1.0000 | ORAL_TABLET | Freq: Three times a day (TID) | ORAL | 0 refills | Status: DC | PRN

## 2020-11-17 NOTE — Telephone Encounter (Signed)
Just sent

## 2020-11-19 ENCOUNTER — Ambulatory Visit (INDEPENDENT_AMBULATORY_CARE_PROVIDER_SITE_OTHER): Payer: 59 | Admitting: Physical Therapy

## 2020-11-19 ENCOUNTER — Other Ambulatory Visit: Payer: Self-pay

## 2020-11-19 DIAGNOSIS — R600 Localized edema: Secondary | ICD-10-CM | POA: Diagnosis not present

## 2020-11-19 DIAGNOSIS — M25611 Stiffness of right shoulder, not elsewhere classified: Secondary | ICD-10-CM

## 2020-11-19 DIAGNOSIS — M6281 Muscle weakness (generalized): Secondary | ICD-10-CM | POA: Diagnosis not present

## 2020-11-19 DIAGNOSIS — M25511 Pain in right shoulder: Secondary | ICD-10-CM | POA: Diagnosis not present

## 2020-11-19 NOTE — Therapy (Signed)
[facility] Physical Therapy [address] [place], [place], [address] Phone: [phone]   Fax:  [phone]  Physical Therapy Treatment  Patient Details  Name: Luis Lopez MRN: [id number] Date of Birth: [birth year] Referring Provider (PT): Erlinda Hong, MD   Encounter Date: 11/19/2020   PT End of Session - 11/19/20 1336     Visit Number 2    Number of Visits 18    Date for PT Re-Evaluation 01/08/21    PT Start Time 1301    PT Stop Time 1339    PT Time Calculation (min) 38 min    Activity Tolerance Patient tolerated treatment well    Behavior During Therapy [facility] for tasks assessed/performed             Past Medical History:  Diagnosis Date   Diabetes mellitus without complication ([place])    Hypertension    borderline   PVC (premature ventricular contraction) 04/29/2018   Sleep apnea    uses a cpap   Snoring     Past Surgical History:  Procedure Laterality Date   INSERTION OF MESH N/A 11/03/2017   Procedure: INSERTION OF MESH;  Surgeon: Erroll Luna, MD;  Location: [place];  Service: General;  Laterality: N/A;   SHOULDER ACROMIOPLASTY Right 10/29/2020   Procedure: RIGHT SHOULDER ARTHROSCOPY, EXTENSIVE DEBRIDEMENT, DISTAL CLAVICLE EXCISION, SUBACROMIAL DECOMPRESSION, BICEPS TENODESIS;  Surgeon: Leandrew Koyanagi, MD;  Location: [place];  Service: Orthopedics;  Laterality: Right;   UMBILICAL HERNIA REPAIR  [id number]   UMBILICAL HERNIA REPAIR N/A 11/03/2017   Procedure: UMBILICAL HERNIA REPAIR WITH MESH;  Surgeon: Erroll Luna, MD;  Location: [place];  Service: General;  Laterality: N/A;    There were no vitals filed for this visit.   Subjective Assessment - 11/19/20 1307     Subjective relays not having much pain, isnt really taking pain meds anymore    Pertinent History s/p right shoulder arthroscopy biceps tenodesis with loop and tack as well as arthroscopic DCE, extensive debridement, subacromial decompression but no actual rotator cuff  repair. by Dr. Erlinda Hong on 10/29/2020.    Limitations Lifting;House hold activities    Patient Stated Goals get back to normal    Pain Onset More than a month ago                [facility] Adult PT Treatment/Exercise - 11/19/20 0001       Shoulder Exercises: Supine   External Rotation AAROM;15 reps    Internal Rotation AROM;15 reps    Flexion AROM;15 reps    ABduction AAROM;15 reps      Shoulder Exercises: Standing   Other Standing Exercises isometrics for Rt shoulder flexion, abd, ER, IR 5 sec holds X10 ea      Shoulder Exercises: Pulleys   Flexion 3 minutes    ABduction 3 minutes      Shoulder Exercises: Stretch   Other Shoulder Stretches table slide stretch for flexion, abd, ER 5 sec holds X10 ea      Manual Therapy   Manual therapy comments Rt shoulder PROM to tolerance all planes, Gentle distraction mobs                      PT Short Term Goals - 11/13/20 1206       PT SHORT TERM GOAL #1   Title Pt will be I and compliant with HEP.    Time 4    Period Weeks    Status New    Target  Date 12/11/20      PT SHORT TERM GOAL #2   Title Pt will improve PROM Rt shoulder  to [facility]    Time 4    Period Weeks    Status New               PT Long Term Goals - 11/13/20 1207       PT LONG TERM GOAL #1   Title Pt will improve Rt shoulder AROM to [facility].    Time 8    Period Weeks    Status New    Target Date 01/08/21      PT LONG TERM GOAL #2   Title Pt will improve Rt shoulder strength to 5/5 MMT to improve functional lifting    Time 8    Period Weeks    Status New      PT LONG TERM GOAL #3   Title Pt will improve FOTO score to 70% functional.    Time 8    Period Weeks    Status New      PT LONG TERM GOAL #4   Title Pt will I and compliant with final HEP.    Time 8    Period Weeks    Status New      PT LONG TERM GOAL #5   Title Pt will report less than 3/10 overall pain with reaching overhead and behind back and performing usual ADL's, and being  able to return to bowling.    Time 8    Period Weeks    Status New                   Plan - 11/19/20 1337     Clinical Impression Statement Session focused on AAROM improvments with the addition of isometrics today with good overall tolerance. He was encouraged to stretch more at home, and his home pulleys have arrived now which should help further improve his ROM. Continue POC    Personal Factors and Comorbidities Comorbidity 2    Comorbidities DM, HTN    Examination-Activity Limitations Lift;Bathing;Bed Mobility;Carry;Sleep    Examination-Participation Restrictions Cleaning;Occupation;Driving;Laundry    Stability/Clinical Decision Making Stable/Uncomplicated    Rehab Potential Good    PT Frequency 2x / week   2-3   PT Duration 8 weeks    PT Next Visit Plan ROM early focus to minimize frozen shoulder risk, MD notes say "Okay for passive and active range of motion of the right shoulder as well as deltoid isometrics but avoid passive and active shoulder extension specifically and avoid bicep strengthening exercises.  He will hold off on lifting"    PT Home Exercise Plan Access Code: [email or web address]    Consulted and Agree with Plan of Care Patient;Family member/caregiver    Family Member Consulted wife             Patient will benefit from skilled therapeutic intervention in order to improve the following deficits and impairments:  Decreased activity tolerance, Decreased mobility, Decreased range of motion, Decreased strength, Increased edema, Increased muscle spasms, Impaired flexibility, Pain, Impaired UE functional use  Visit Diagnosis: Acute pain of right shoulder  Stiffness of right shoulder, not elsewhere classified  Muscle weakness (generalized)  Localized edema     Problem List Patient Active Problem List   Diagnosis Date Noted   Nontraumatic tear of supraspinatus tendon, right 10/29/2020   Degenerative superior labral anterior-to-posterior (SLAP) tear of right  shoulder 10/29/2020   Arthrosis of right acromioclavicular joint 10/29/2020  Muscle spasm 07/26/2018   Acute bilateral low back pain without sciatica 07/26/2018   PVC (premature ventricular contraction) 04/29/2018   Abnormal EKG 03/01/2018   Essential hypertension XX123456   Umbilical hernia 123XX123   Morbid obesity due to excess calories ([place]) 11/06/2015   Nocturia more than twice per night 11/06/2015   Sleep deprivation 11/06/2015   Hypersomnia with sleep apnea 11/06/2015   OSA (obstructive sleep apnea) 11/06/2015    Silvestre Mesi 11/19/2020, 1:40 PM  [facility] Physical Therapy [address] [place], [place], [address] Phone: [phone]   Fax:  [phone]  Name: Andray Dinolfo MRN: [id number] Date of Birth: [birth year]

## 2020-11-28 ENCOUNTER — Ambulatory Visit (INDEPENDENT_AMBULATORY_CARE_PROVIDER_SITE_OTHER): Payer: 59 | Admitting: Physical Therapy

## 2020-11-28 ENCOUNTER — Ambulatory Visit (INDEPENDENT_AMBULATORY_CARE_PROVIDER_SITE_OTHER): Payer: 59 | Admitting: Orthopaedic Surgery

## 2020-11-28 ENCOUNTER — Encounter: Payer: Self-pay | Admitting: Physical Therapy

## 2020-11-28 ENCOUNTER — Encounter: Payer: Self-pay | Admitting: Orthopaedic Surgery

## 2020-11-28 ENCOUNTER — Other Ambulatory Visit: Payer: Self-pay

## 2020-11-28 VITALS — Ht 75.0 in | Wt 331.0 lb

## 2020-11-28 DIAGNOSIS — M6281 Muscle weakness (generalized): Secondary | ICD-10-CM

## 2020-11-28 DIAGNOSIS — M25611 Stiffness of right shoulder, not elsewhere classified: Secondary | ICD-10-CM

## 2020-11-28 DIAGNOSIS — R600 Localized edema: Secondary | ICD-10-CM

## 2020-11-28 DIAGNOSIS — M25511 Pain in right shoulder: Secondary | ICD-10-CM | POA: Diagnosis not present

## 2020-11-28 DIAGNOSIS — M7541 Impingement syndrome of right shoulder: Secondary | ICD-10-CM

## 2020-11-28 DIAGNOSIS — M7501 Adhesive capsulitis of right shoulder: Secondary | ICD-10-CM

## 2020-11-28 NOTE — Therapy (Signed)
[facility] [address] [place], [place], [address] Phone: [phone]   Fax:  [phone]  Physical Therapy Treatment  Patient Details  Name: Luis Lopez MRN: [id number] Date of Birth: September 19, 1975 Referring Provider (PT): Erlinda Hong, MD   Encounter Date: 11/28/2020   PT End of Session - 11/28/20 1137     Visit Number 3    Number of Visits 18    Date for PT Re-Evaluation 01/08/21    PT Start Time 1100    PT Stop Time 1140    PT Time Calculation (min) 40 min    Activity Tolerance Patient tolerated treatment well    Behavior During Therapy [facility] for tasks assessed/performed             Past Medical History:  Diagnosis Date   Diabetes mellitus without complication ([place])    Hypertension    borderline   PVC (premature ventricular contraction) 04/29/2018   Sleep apnea    uses a cpap   Snoring     Past Surgical History:  Procedure Laterality Date   INSERTION OF MESH N/A 11/03/2017   Procedure: INSERTION OF MESH;  Surgeon: Erroll Luna, MD;  Location: [place];  Service: General;  Laterality: N/A;   SHOULDER ACROMIOPLASTY Right 10/29/2020   Procedure: RIGHT SHOULDER ARTHROSCOPY, EXTENSIVE DEBRIDEMENT, DISTAL CLAVICLE EXCISION, SUBACROMIAL DECOMPRESSION, BICEPS TENODESIS;  Surgeon: Leandrew Koyanagi, MD;  Location: [place];  Service: Orthopedics;  Laterality: Right;   UMBILICAL HERNIA REPAIR  [id number]   UMBILICAL HERNIA REPAIR N/A 11/03/2017   Procedure: UMBILICAL HERNIA REPAIR WITH MESH;  Surgeon: Erroll Luna, MD;  Location: [place];  Service: General;  Laterality: N/A;    There were no vitals filed for this visit.   Subjective Assessment - 11/28/20 1108     Subjective Patient has been to the MD. He is going to have a shot next week. He reports it has been sore.    Pertinent History s/p right shoulder arthroscopy biceps tenodesis with loop and tack as well as arthroscopic DCE, extensive debridement, subacromial decompression but  no actual rotator cuff repair. by Dr. Erlinda Hong on 10/29/2020.    Currently in Pain? Yes    Pain Score 4     Pain Location Shoulder    Pain Orientation Right    Pain Descriptors / Indicators Aching    Pain Type Surgical pain    Pain Onset More than a month ago    Pain Frequency Constant    Aggravating Factors  use of the arm    Pain Relieving Factors difficulty perfroming ADL's                               [facility] Adult PT Treatment/Exercise - 11/28/20 0001       Shoulder Exercises: Supine   External Rotation AAROM;15 reps    Flexion AROM    Flexion Limitations 2x10 with cuing for range      Shoulder Exercises: Standing   Other Standing Exercises scap retractionto neutral 2x10; shoulder extension to neutral 2x10 yellow      Shoulder Exercises: Pulleys   Flexion 3 minutes    ABduction 3 minutes      Manual Therapy   Manual Therapy Soft tissue mobilization;Joint mobilization    Joint Mobilization inferior and posterior glides grade II and III    Soft tissue mobilization to upper trap  PT Education - 11/28/20 1136     Education Details reviewed HEp and symptom management    Person(s) Educated Patient    Methods Explanation;Demonstration;Tactile cues;Verbal cues    Comprehension Verbalized understanding;Returned demonstration;Verbal cues required;Tactile cues required              PT Short Term Goals - 11/13/20 1206       PT SHORT TERM GOAL #1   Title Pt will be I and compliant with HEP.    Time 4    Period Weeks    Status New    Target Date 12/11/20      PT SHORT TERM GOAL #2   Title Pt will improve PROM Rt shoulder  to [facility]    Time 4    Period Weeks    Status New               PT Long Term Goals - 11/13/20 1207       PT LONG TERM GOAL #1   Title Pt will improve Rt shoulder AROM to [facility].    Time 8    Period Weeks    Status New    Target Date 01/08/21      PT LONG TERM GOAL #2   Title Pt will improve  Rt shoulder strength to 5/5 MMT to improve functional lifting    Time 8    Period Weeks    Status New      PT LONG TERM GOAL #3   Title Pt will improve FOTO score to 70% functional.    Time 8    Period Weeks    Status New      PT LONG TERM GOAL #4   Title Pt will I and compliant with final HEP.    Time 8    Period Weeks    Status New      PT LONG TERM GOAL #5   Title Pt will report less than 3/10 overall pain with reaching overhead and behind back and performing usual ADL's, and being able to return to bowling.    Time 8    Period Weeks    Status New                   Plan - 11/28/20 1139     Clinical Impression Statement Patient continues to be tight in external rotation and flexion. Therapy reviewed technique with and exercises. He was advised to make sure he dosen't just straighten his elbow out when he is doing his ER stretches. He had improved flexion with manual therapy. Therapy also added scpaular strengthening, with care not to stress the tenodesis. He will be having a shot next week at some point. He will also begin icing more consitently.    Personal Factors and Comorbidities Comorbidity 2    Comorbidities DM, HTN    Examination-Activity Limitations Lift;Bathing;Bed Mobility;Carry;Sleep    Stability/Clinical Decision Making Stable/Uncomplicated    Clinical Decision Making Low    Rehab Potential Good    PT Frequency 2x / week    PT Duration 8 weeks    PT Treatment/Interventions ADLs/Self Care Home Management;Cryotherapy;Electrical Stimulation;Iontophoresis '4mg'$ /ml Dexamethasone;Moist Heat;Ultrasound;Functional mobility training;Therapeutic activities;Therapeutic exercise;Neuromuscular re-education;Patient/family education;Manual techniques;Taping    PT Next Visit Plan ROM early focus to minimize frozen shoulder risk, MD notes say "Okay for passive and active range of motion of the right shoulder as well as deltoid isometrics but avoid passive and active shoulder  extension specifically and avoid bicep strengthening exercises.  He will hold off on lifting"    PT Home Exercise Plan Access Code: [email or web address]    Consulted and Agree with Plan of Care Patient;Family member/caregiver             Patient will benefit from skilled therapeutic intervention in order to improve the following deficits and impairments:  Decreased activity tolerance, Decreased mobility, Decreased range of motion, Decreased strength, Increased edema, Increased muscle spasms, Impaired flexibility, Pain, Impaired UE functional use  Visit Diagnosis: Acute pain of right shoulder  Stiffness of right shoulder, not elsewhere classified  Muscle weakness (generalized)  Localized edema     Problem List Patient Active Problem List   Diagnosis Date Noted   Nontraumatic tear of supraspinatus tendon, right 10/29/2020   Degenerative superior labral anterior-to-posterior (SLAP) tear of right shoulder 10/29/2020   Arthrosis of right acromioclavicular joint 10/29/2020   Muscle spasm 07/26/2018   Acute bilateral low back pain without sciatica 07/26/2018   PVC (premature ventricular contraction) 04/29/2018   Abnormal EKG 03/01/2018   Essential hypertension [id number]   Umbilical hernia 123XX123   Morbid obesity due to excess calories ([place]) 11/06/2015   Nocturia more than twice per night 11/06/2015   Sleep deprivation 11/06/2015   Hypersomnia with sleep apnea 11/06/2015   OSA (obstructive sleep apnea) 11/06/2015    Carney Living, PT DPT  11/28/2020, 2:05 PM  [facility] [address] [place], [place], [address] Phone: [phone]   Fax:  [phone]  Name: Luis Lopez MRN: [id number] Date of Birth: [birth year]

## 2020-11-28 NOTE — Progress Notes (Signed)
Post-Op Visit Note   Patient: Luis Lopez           Date of Birth: 05/28/1975           MRN: [id number] Visit Date: 11/28/2020 PCP: [name], FNP   Assessment & Plan:  Chief Complaint:  Chief Complaint  Patient presents with   Right Shoulder - Follow-up    Right shoulder arthroscopy 10/29/2020   Visit Diagnoses:  1. Impingement syndrome of right shoulder   2. Adhesive capsulitis of right shoulder     Plan: Mr. Luis Lopez is status post right shoulder scope on 10/29/2020.  No real complaints other than stiffness.  He is attending physical therapy twice a week.  Takes oxycodone twice a day.  Right shoulder shows fully healed surgical scars.  No Popeye deformity.  He is fairly limited in terms of range of motion and has not really progressed much since last visit.  At this point I have recommended intra-articular steroid injection which will hopefully facilitate and jumpstart his range of motion and physical therapy.  Referral has been sent to Dr. Ernestina Patches for the injection.  Recheck in 6 weeks.  He should continue physical therapy.  Follow-Up Instructions: Return in about 6 weeks (around 01/09/2021).   Orders:  Orders Placed This Encounter  Procedures   Ambulatory referral to Physical Medicine Rehab   No orders of the defined types were placed in this encounter.   Imaging: No results found.  PMFS History: Patient Active Problem List   Diagnosis Date Noted   Nontraumatic tear of supraspinatus tendon, right 10/29/2020   Degenerative superior labral anterior-to-posterior (SLAP) tear of right shoulder 10/29/2020   Arthrosis of right acromioclavicular joint 10/29/2020   Muscle spasm 07/26/2018   Acute bilateral low back pain without sciatica 07/26/2018   PVC (premature ventricular contraction) 04/29/2018   Abnormal EKG 03/01/2018   Essential hypertension [id number]   Umbilical hernia 123XX123   Morbid obesity due to excess calories ([place]) 11/06/2015   Nocturia more than  twice per night 11/06/2015   Sleep deprivation 11/06/2015   Hypersomnia with sleep apnea 11/06/2015   OSA (obstructive sleep apnea) 11/06/2015   Past Medical History:  Diagnosis Date   Diabetes mellitus without complication ([place])    Hypertension    borderline   PVC (premature ventricular contraction) 04/29/2018   Sleep apnea    uses a cpap   Snoring     Family History  Problem Relation Age of Onset   Diabetes Father    Diabetes Brother    Hypertension Brother    Diabetes Sister    Hypertension Sister    Aneurysm Paternal Grandmother        brain    Past Surgical History:  Procedure Laterality Date   INSERTION OF MESH N/A 11/03/2017   Procedure: INSERTION OF MESH;  Surgeon: Erroll Luna, MD;  Location: [place];  Service: General;  Laterality: N/A;   SHOULDER ACROMIOPLASTY Right 10/29/2020   Procedure: RIGHT SHOULDER ARTHROSCOPY, EXTENSIVE DEBRIDEMENT, DISTAL CLAVICLE EXCISION, SUBACROMIAL DECOMPRESSION, BICEPS TENODESIS;  Surgeon: Leandrew Koyanagi, MD;  Location: [place];  Service: Orthopedics;  Laterality: Right;   UMBILICAL HERNIA REPAIR  [id number]   UMBILICAL HERNIA REPAIR N/A 11/03/2017   Procedure: UMBILICAL HERNIA REPAIR WITH MESH;  Surgeon: Erroll Luna, MD;  Location: [place];  Service: General;  Laterality: N/A;   Social History   Occupational History   Not on file  Tobacco Use   Smoking status: Former    Types: Cigarettes  Quit date: 03/23/2003    Years since quitting: 17.6   Smokeless tobacco: Never  Vaping Use   Vaping Use: Never used  Substance and Sexual Activity   Alcohol use: No   Drug use: No   Sexual activity: Yes    Birth control/protection: None

## 2020-12-02 ENCOUNTER — Ambulatory Visit (INDEPENDENT_AMBULATORY_CARE_PROVIDER_SITE_OTHER): Payer: 59 | Admitting: Physical Medicine and Rehabilitation

## 2020-12-02 ENCOUNTER — Encounter: Payer: Self-pay | Admitting: Physical Medicine and Rehabilitation

## 2020-12-02 ENCOUNTER — Other Ambulatory Visit: Payer: Self-pay

## 2020-12-02 ENCOUNTER — Ambulatory Visit: Payer: Self-pay

## 2020-12-02 DIAGNOSIS — G8929 Other chronic pain: Secondary | ICD-10-CM

## 2020-12-02 DIAGNOSIS — M25511 Pain in right shoulder: Secondary | ICD-10-CM | POA: Insufficient documentation

## 2020-12-02 DIAGNOSIS — M7501 Adhesive capsulitis of right shoulder: Secondary | ICD-10-CM

## 2020-12-02 HISTORY — DX: Adhesive capsulitis of right shoulder: M75.01

## 2020-12-02 NOTE — Progress Notes (Signed)
   [name] - 45 y.o. male MRN [id number]  Date of birth: 10/04/1975  Office Visit Note: Visit Date: 12/02/2020 PCP: [name], FNP Referred by: [name], FNP  Subjective: Chief Complaint  Patient presents with   Right Shoulder - Pain    HPI:  Luis Lopez is a 45 y.o. male who comes in today at the request of Dr. Eduard Roux for planned Right anesthetic glenohumeral arthrogram with fluoroscopic guidance.  The patient has failed conservative care including home exercise, medications, time and activity modification.  This injection will be diagnostic and hopefully therapeutic.  Please see requesting physician notes for further details and justification.   ROS Otherwise per HPI.  Assessment & Plan: Visit Diagnoses:    ICD-10-CM   1. Chronic right shoulder pain  M25.511 XR C-ARM NO REPORT   G89.29 Large Joint Inj: R glenohumeral    2. Adhesive capsulitis of right shoulder  M75.01 XR C-ARM NO REPORT    Large Joint Inj: R glenohumeral      Plan: No additional findings.   Meds & Orders: No orders of the defined types were placed in this encounter.   Orders Placed This Encounter  Procedures   Large Joint Inj: R glenohumeral   XR C-ARM NO REPORT    Follow-up: Return for Eduard Roux, MD as scheduled.   Procedures: Large Joint Inj: R glenohumeral on 12/02/2020 8:31 AM Indications: pain and diagnostic evaluation Details: 22 G 3.5 in needle, fluoroscopy-guided anteromedial approach  Arthrogram: No  Medications: 3 mL bupivacaine 0.5 %; 60 mg triamcinolone acetonide 40 MG/ML Outcome: tolerated well, no immediate complications  There was excellent flow of contrast producing a partial arthrogram of the glenohumeral joint. The patient did have relief of symptoms during the anesthetic phase of the injection. Procedure, treatment alternatives, risks and benefits explained, specific risks discussed. Consent was given by the patient. Immediately prior to procedure a time out was  called to verify the correct patient, procedure, equipment, support staff and site/side marked as required. Patient was prepped and draped in the usual sterile fashion.         Clinical History: No specialty comments available.     Objective:  VS:  HT:    WT:   BMI:     BP:   HR: bpm  TEMP: ( )  RESP:  Physical Exam Musculoskeletal:        General: No tenderness, deformity or signs of injury.     Comments: Painful decreased range of motion right shoulder     Imaging: XR C-ARM NO REPORT  Result Date: 12/02/2020 Please see Notes tab for imaging impression.

## 2020-12-02 NOTE — Progress Notes (Signed)
Right shoulder pain. Numeric Pain Rating Scale and Functional Assessment Average Pain 5   In the last MONTH (on 0-10 scale) has pain interfered with the following?  1. General activity like being  able to carry out your everyday physical activities such as walking, climbing stairs, carrying groceries, or moving a chair?  Rating(4)    -Dye Allergies.

## 2020-12-03 ENCOUNTER — Telehealth: Payer: Self-pay | Admitting: Rehabilitative and Restorative Service Providers"

## 2020-12-03 ENCOUNTER — Encounter: Payer: 59 | Admitting: Rehabilitative and Restorative Service Providers"

## 2020-12-03 MED ORDER — TRIAMCINOLONE ACETONIDE 40 MG/ML IJ SUSP
60.0000 mg | INTRAMUSCULAR | Status: AC | PRN
  Administered 2020-12-02: 60 mg via INTRA_ARTICULAR

## 2020-12-03 MED ORDER — BUPIVACAINE HCL 0.5 % IJ SOLN
3.0000 mL | INTRAMUSCULAR | Status: AC | PRN
  Administered 2020-12-02: 3 mL via INTRA_ARTICULAR

## 2020-12-03 NOTE — Telephone Encounter (Signed)
Called Pt. After 15 mins no show for appointment.  He indicated that he was running late with school drop offs and would plan on being here on Friday's appointment.  1st noted no show  Scot Jun, PT, DPT, OCS, ATC 12/03/20  9:03 AM

## 2020-12-05 ENCOUNTER — Encounter: Payer: Self-pay | Admitting: Rehabilitative and Restorative Service Providers"

## 2020-12-05 ENCOUNTER — Ambulatory Visit (INDEPENDENT_AMBULATORY_CARE_PROVIDER_SITE_OTHER): Payer: 59 | Admitting: Rehabilitative and Restorative Service Providers"

## 2020-12-05 ENCOUNTER — Other Ambulatory Visit: Payer: Self-pay

## 2020-12-05 DIAGNOSIS — M6281 Muscle weakness (generalized): Secondary | ICD-10-CM

## 2020-12-05 DIAGNOSIS — M25611 Stiffness of right shoulder, not elsewhere classified: Secondary | ICD-10-CM | POA: Diagnosis not present

## 2020-12-05 DIAGNOSIS — M25511 Pain in right shoulder: Secondary | ICD-10-CM

## 2020-12-05 DIAGNOSIS — R600 Localized edema: Secondary | ICD-10-CM

## 2020-12-05 NOTE — Therapy (Signed)
[facility] [place], [place], [address] Phone: [phone]   Fax:  [phone]  Physical Therapy Treatment  Patient Details  Name: Luis Lopez MRN: [id number] Date of Birth: 1975-08-05 Referring Provider (PT): Erlinda Hong, MD   Encounter Date: 12/05/2020   PT End of Session - 12/05/20 0840     Visit Number 4    Number of Visits 18    Date for PT Re-Evaluation 01/08/21    PT Start Time 0840    PT Stop Time 0920    PT Time Calculation (min) 40 min    Activity Tolerance Patient tolerated treatment well    Behavior During Therapy [facility] for tasks assessed/performed             Past Medical History:  Diagnosis Date   Diabetes mellitus without complication ([place])    Hypertension    borderline   PVC (premature ventricular contraction) 04/29/2018   Sleep apnea    uses a cpap   Snoring     Past Surgical History:  Procedure Laterality Date   INSERTION OF MESH N/A 11/03/2017   Procedure: INSERTION OF MESH;  Surgeon: Erroll Luna, MD;  Location: [place];  Service: General;  Laterality: N/A;   SHOULDER ACROMIOPLASTY Right 10/29/2020   Procedure: RIGHT SHOULDER ARTHROSCOPY, EXTENSIVE DEBRIDEMENT, DISTAL CLAVICLE EXCISION, SUBACROMIAL DECOMPRESSION, BICEPS TENODESIS;  Surgeon: Leandrew Koyanagi, MD;  Location: [place];  Service: Orthopedics;  Laterality: Right;   UMBILICAL HERNIA REPAIR  [id number]   UMBILICAL HERNIA REPAIR N/A 11/03/2017   Procedure: UMBILICAL HERNIA REPAIR WITH MESH;  Surgeon: Erroll Luna, MD;  Location: [place];  Service: General;  Laterality: N/A;    There were no vitals filed for this visit.   Subjective Assessment - 12/05/20 0845     Subjective Pt. stated pain at worst 7/10 at worst since last visit, noticed c movement and stiffness.  No pain at rest upon arrival today.    Pertinent History s/p right shoulder arthroscopy biceps tenodesis with loop and tack as well as arthroscopic DCE, extensive  debridement, subacromial decompression but no actual rotator cuff repair. by Dr. Erlinda Hong on 10/29/2020.    Currently in Pain? No/denies    Pain Score 0-No pain   7/10 at worst   Pain Location Shoulder    Pain Orientation Right    Pain Descriptors / Indicators Aching;Sore;Tightness    Pain Type Surgical pain    Pain Onset More than a month ago    Pain Frequency Intermittent    Aggravating Factors  exercise, stiffness c movement    Pain Relieving Factors icing after stretching                [facility] PT Assessment - 12/05/20 0001       Assessment   Medical Diagnosis s/p right shoulder arthroscopy biceps tenodesis with loop and tack as well as arthroscopic DCE, extensive debridement, subacromial decompression but no actual rotator cuff repair.    Referring Provider (PT) Erlinda Hong, MD    Onset Date/Surgical Date 10/29/20    Hand Dominance Right      Precautions   Precaution Comments avoiding extension and biceps contraction      AROM   Overall AROM Comments measured in supine    Right Shoulder Flexion 122 Degrees    Right Shoulder ABduction 90 Degrees    Right Shoulder Internal Rotation 52 Degrees   in 45 deg abduction   Right Shoulder External Rotation 15 Degrees   in 45 degrees  abduction                          [facility] Adult PT Treatment/Exercise - 12/05/20 0001       Shoulder Exercises: Supine   External Rotation AAROM;Right   c wand 10 sec x 10 in 45 deg abduction     Shoulder Exercises: Sidelying   Flexion Right;AROM   2 x 10   ABduction Right;AROM   3 x 10     Shoulder Exercises: Pulleys   Flexion 3 minutes    ABduction 3 minutes    Other Pulley Exercises 5 second holds      Shoulder Exercises: ROM/Strengthening   UBE (Upper Arm Bike) Lvl 3 3 mins fwd/back each way      Manual Therapy   Manual therapy comments g3 infferior joint mobs in flexion, scaption and abduction, Mobilization c movement for ER in 45 deg abduction c sustained posterior glide movement  to tolerance                       PT Short Term Goals - 12/05/20 0846       PT SHORT TERM GOAL #1   Title Pt will be I and compliant with HEP.    Time 4    Period Weeks    Status On-going    Target Date 12/11/20      PT SHORT TERM GOAL #2   Title Pt will improve PROM Rt shoulder  to [facility]    Time 4    Period Weeks    Status On-going    Target Date 12/11/20               PT Long Term Goals - 11/13/20 1207       PT LONG TERM GOAL #1   Title Pt will improve Rt shoulder AROM to [facility]    Time 8    Period Weeks    Status New    Target Date 01/08/21      PT LONG TERM GOAL #2   Title Pt will improve Rt shoulder strength to 5/5 MMT to improve functional lifting    Time 8    Period Weeks    Status New      PT LONG TERM GOAL #3   Title Pt will improve FOTO score to 70% functional.    Time 8    Period Weeks    Status New      PT LONG TERM GOAL #4   Title Pt will I and compliant with final HEP.    Time 8    Period Weeks    Status New      PT LONG TERM GOAL #5   Title Pt will report less than 3/10 overall pain with reaching overhead and behind back and performing usual ADL's, and being able to return to bowling.    Time 8    Period Weeks    Status New                   Plan - 12/05/20 0906     Clinical Impression Statement Acitive mobility measurements are improved from evaluation but do continue to show deficits in all directions c capsular tightness pattern noted.  Pt. to benefit from continued skilled PT services for active and passive mobility gains.    Personal Factors and Comorbidities Comorbidity 2    Comorbidities DM, HTN    Examination-Activity Limitations  Lift;Bathing;Bed Mobility;Carry;Sleep    Stability/Clinical Decision Making Stable/Uncomplicated    Rehab Potential Good    PT Frequency 2x / week    PT Duration 8 weeks    PT Treatment/Interventions ADLs/Self Care Home Management;Cryotherapy;Electrical  Stimulation;Iontophoresis '4mg'$ /ml Dexamethasone;Moist Heat;Ultrasound;Functional mobility training;Therapeutic activities;Therapeutic exercise;Neuromuscular re-education;Patient/family education;Manual techniques;Taping    PT Next Visit Plan Active mobility gains in gravity reduced positioning, end range mobility gains.  (limit extension and biceps)    PT Home Exercise Plan Access Code: [email or web address]    Consulted and Agree with Plan of Care Patient             Patient will benefit from skilled therapeutic intervention in order to improve the following deficits and impairments:  Decreased activity tolerance, Decreased mobility, Decreased range of motion, Decreased strength, Increased edema, Increased muscle spasms, Impaired flexibility, Pain, Impaired UE functional use  Visit Diagnosis: Acute pain of right shoulder  Stiffness of right shoulder, not elsewhere classified  Muscle weakness (generalized)  Localized edema     Problem List Patient Active Problem List   Diagnosis Date Noted   Adhesive capsulitis of right shoulder 12/02/2020   Chronic right shoulder pain 12/02/2020   Nontraumatic tear of supraspinatus tendon, right 10/29/2020   Degenerative superior labral anterior-to-posterior (SLAP) tear of right shoulder 10/29/2020   Arthrosis of right acromioclavicular joint 10/29/2020   Muscle spasm 07/26/2018   Acute bilateral low back pain without sciatica 07/26/2018   PVC (premature ventricular contraction) 04/29/2018   Abnormal EKG 03/01/2018   Essential hypertension XX123456   Umbilical hernia 123XX123   Morbid obesity due to excess calories ([place]) 11/06/2015   Nocturia more than twice per night 11/06/2015   Sleep deprivation 11/06/2015   Hypersomnia with sleep apnea 11/06/2015   OSA (obstructive sleep apnea) 11/06/2015    Scot Jun, PT, DPT, OCS, ATC 12/05/20  9:16 AM    [facility] [address] [place], [place],  [address] Phone: [phone]   Fax:  [phone]  Name: Luis Lopez MRN: [id number] Date of Birth: 1975/07/31

## 2020-12-08 ENCOUNTER — Encounter: Payer: 59 | Admitting: Physical Therapy

## 2020-12-08 ENCOUNTER — Telehealth: Payer: Self-pay | Admitting: Physical Therapy

## 2020-12-08 NOTE — Telephone Encounter (Signed)
Pt no show for PT appointment today. They were contacted and informed of this via voicemail. They were provided the date and time of their next appointment on voicemail. They were instructed to call us to let us know if they cannot make their appointment.   Elsie Ra, PT, DPT 12/08/20 12:04 PM

## 2020-12-10 ENCOUNTER — Other Ambulatory Visit: Payer: Self-pay

## 2020-12-10 ENCOUNTER — Ambulatory Visit (INDEPENDENT_AMBULATORY_CARE_PROVIDER_SITE_OTHER): Payer: 59 | Admitting: Physical Therapy

## 2020-12-10 DIAGNOSIS — R600 Localized edema: Secondary | ICD-10-CM | POA: Diagnosis not present

## 2020-12-10 DIAGNOSIS — M25611 Stiffness of right shoulder, not elsewhere classified: Secondary | ICD-10-CM | POA: Diagnosis not present

## 2020-12-10 DIAGNOSIS — M6281 Muscle weakness (generalized): Secondary | ICD-10-CM

## 2020-12-10 DIAGNOSIS — M25511 Pain in right shoulder: Secondary | ICD-10-CM

## 2020-12-10 NOTE — Therapy (Signed)
[facility] Physical Therapy [address] [place], [place], [address] Phone: [phone]   Fax:  [phone]  Physical Therapy Treatment  Patient Details  Name: Luis Lopez MRN: [id number] Date of Birth: 03-26-1975 Referring Provider (PT): Erlinda Hong, MD   Encounter Date: 12/10/2020   PT End of Session - 12/10/20 1109     Visit Number 5    Number of Visits 18    Date for PT Re-Evaluation 01/08/21    PT Start Time 3007    PT Stop Time 1055    PT Time Calculation (min) 40 min    Activity Tolerance Patient tolerated treatment well    Behavior During Therapy [facility] for tasks assessed/performed             Past Medical History:  Diagnosis Date   Diabetes mellitus without complication ([place])    Hypertension    borderline   PVC (premature ventricular contraction) 04/29/2018   Sleep apnea    uses a cpap   Snoring     Past Surgical History:  Procedure Laterality Date   INSERTION OF MESH N/A 11/03/2017   Procedure: INSERTION OF MESH;  Surgeon: Erroll Luna, MD;  Location: [place];  Service: General;  Laterality: N/A;   SHOULDER ACROMIOPLASTY Right 10/29/2020   Procedure: RIGHT SHOULDER ARTHROSCOPY, EXTENSIVE DEBRIDEMENT, DISTAL CLAVICLE EXCISION, SUBACROMIAL DECOMPRESSION, BICEPS TENODESIS;  Surgeon: Leandrew Koyanagi, MD;  Location: [place];  Service: Orthopedics;  Laterality: Right;   UMBILICAL HERNIA REPAIR  62/26/3335   UMBILICAL HERNIA REPAIR N/A 11/03/2017   Procedure: UMBILICAL HERNIA REPAIR WITH MESH;  Surgeon: Erroll Luna, MD;  Location: [place];  Service: General;  Laterality: N/A;    There were no vitals filed for this visit.   Subjective Assessment - 12/10/20 1031     Subjective Pt states 2/10 pain in his Rt shoulder upon arrival to PT.    Pertinent History s/p right shoulder arthroscopy biceps tenodesis with loop and tack as well as arthroscopic DCE, extensive debridement, subacromial decompression but no actual rotator cuff repair. by  Dr. Erlinda Hong on 10/29/2020.    Limitations Lifting;House hold activities    Patient Stated Goals get back to normal    Pain Onset More than a month ago               [facility] Adult PT Treatment/Exercise - 12/10/20 0001       Shoulder Exercises: Supine   External Rotation AROM;Right;10 reps    External Rotation Weight (lbs) 2    External Rotation Limitations holding stretch 5 sec    Flexion AROM;10 reps    Flexion Limitations 5 sec hold    Diagonals AROM;Right;10 reps    Diagonals Weight (lbs) 2    Diagonals Limitations D2 holding 5 sec      Shoulder Exercises: Standing   External Rotation Right;20 reps    Theraband Level (Shoulder External Rotation) Level 2 (Red)    Internal Rotation Right;20 reps    Theraband Level (Shoulder Internal Rotation) Level 2 (Red)    Other Standing Exercises wall ladder X10 reps flexion and abd      Shoulder Exercises: ROM/Strengthening   UBE (Upper Arm Bike) Lvl 3 2.5 mins fwd/back each way    [place] 10 reps flexion, abduction, circles      Manual Therapy   Manual therapy comments g3 inferior joint mobs in flexion, scaption and abduction, Mobilization c movement for ER in 45 deg abduction c sustained posterior glide movement to tolerance, PROM all planes  to his tolerance                       PT Short Term Goals - 12/05/20 0846       PT SHORT TERM GOAL #1   Title Pt will be I and compliant with HEP.    Time 4    Period Weeks    Status On-going    Target Date 12/11/20      PT SHORT TERM GOAL #2   Title Pt will improve PROM Rt shoulder  to [facility]    Time 4    Period Weeks    Status On-going    Target Date 12/11/20               PT Long Term Goals - 11/13/20 1207       PT LONG TERM GOAL #1   Title Pt will improve Rt shoulder AROM to [facility].    Time 8    Period Weeks    Status New    Target Date 01/08/21      PT LONG TERM GOAL #2   Title Pt will improve Rt shoulder strength to 5/5 MMT to improve functional lifting     Time 8    Period Weeks    Status New      PT LONG TERM GOAL #3   Title Pt will improve FOTO score to 70% functional.    Time 8    Period Weeks    Status New      PT LONG TERM GOAL #4   Title Pt will I and compliant with final HEP.    Time 8    Period Weeks    Status New      PT LONG TERM GOAL #5   Title Pt will report less than 3/10 overall pain with reaching overhead and behind back and performing usual ADL's, and being able to return to bowling.    Time 8    Period Weeks    Status New                   Plan - 12/10/20 1111     Clinical Impression Statement Continued to focus on improving ROM and GH mobility in his Rt shoulder as this is his biggest deficit and he has capsular pattern. He was encouraged to stretch as much as he can tolerate at home and he verbalizes understanding    Personal Factors and Comorbidities Comorbidity 2    Comorbidities DM, HTN    Examination-Activity Limitations Lift;Bathing;Bed Mobility;Carry;Sleep    Stability/Clinical Decision Making Stable/Uncomplicated    Rehab Potential Good    PT Frequency 2x / week    PT Duration 8 weeks    PT Treatment/Interventions ADLs/Self Care Home Management;Cryotherapy;Electrical Stimulation;Iontophoresis 4mg /ml Dexamethasone;Moist Heat;Ultrasound;Functional mobility training;Therapeutic activities;Therapeutic exercise;Neuromuscular re-education;Patient/family education;Manual techniques;Taping    PT Next Visit Plan Active mobility gains in gravity reduced positioning, end range mobility gains.  (limit extension and biceps)    PT Home Exercise Plan Access Code: [email or web address]    Consulted and Agree with Plan of Care Patient             Patient will benefit from skilled therapeutic intervention in order to improve the following deficits and impairments:  Decreased activity tolerance, Decreased mobility, Decreased range of motion, Decreased strength, Increased edema, Increased muscle spasms, Impaired  flexibility, Pain, Impaired UE functional use  Visit Diagnosis: Acute pain of right shoulder  Stiffness of right shoulder, not  elsewhere classified  Muscle weakness (generalized)  Localized edema     Problem List Patient Active Problem List   Diagnosis Date Noted   Adhesive capsulitis of right shoulder 12/02/2020   Chronic right shoulder pain 12/02/2020   Nontraumatic tear of supraspinatus tendon, right 10/29/2020   Degenerative superior labral anterior-to-posterior (SLAP) tear of right shoulder 10/29/2020   Arthrosis of right acromioclavicular joint 10/29/2020   Muscle spasm 07/26/2018   Acute bilateral low back pain without sciatica 07/26/2018   PVC (premature ventricular contraction) 04/29/2018   Abnormal EKG 03/01/2018   Essential hypertension 47/65/4650   Umbilical hernia 35/46/5681   Morbid obesity due to excess calories ([place]) 11/06/2015   Nocturia more than twice per night 11/06/2015   Sleep deprivation 11/06/2015   Hypersomnia with sleep apnea 11/06/2015   OSA (obstructive sleep apnea) 11/06/2015    Debbe Odea, PT,DPT 12/10/2020, 11:15 AM  [facility] Physical Therapy [address] [place], [place], [address] Phone: [phone]   Fax:  [phone]  Name: Jeffery Bachmeier MRN: [id number] Date of Birth: 1976/01/05

## 2020-12-14 ENCOUNTER — Other Ambulatory Visit: Payer: Self-pay | Admitting: Nurse Practitioner

## 2020-12-15 ENCOUNTER — Encounter: Payer: 59 | Admitting: Rehabilitative and Restorative Service Providers"

## 2020-12-15 ENCOUNTER — Telehealth: Payer: Self-pay | Admitting: Rehabilitative and Restorative Service Providers"

## 2020-12-15 NOTE — Telephone Encounter (Signed)
Called and left message after 20 mins no show for appointment.  3rd missed appointment to this point in treatment cycle.  Asked Pt. To call back to confirm desire to attend future visits.    Scot Jun, PT, DPT, OCS, ATC 12/15/20  10:38 AM

## 2020-12-17 ENCOUNTER — Ambulatory Visit (INDEPENDENT_AMBULATORY_CARE_PROVIDER_SITE_OTHER): Payer: 59 | Admitting: Rehabilitative and Restorative Service Providers"

## 2020-12-17 ENCOUNTER — Other Ambulatory Visit: Payer: Self-pay

## 2020-12-17 ENCOUNTER — Encounter: Payer: Self-pay | Admitting: Rehabilitative and Restorative Service Providers"

## 2020-12-17 DIAGNOSIS — R600 Localized edema: Secondary | ICD-10-CM

## 2020-12-17 DIAGNOSIS — M6281 Muscle weakness (generalized): Secondary | ICD-10-CM

## 2020-12-17 DIAGNOSIS — M25511 Pain in right shoulder: Secondary | ICD-10-CM | POA: Diagnosis not present

## 2020-12-17 DIAGNOSIS — M25611 Stiffness of right shoulder, not elsewhere classified: Secondary | ICD-10-CM

## 2020-12-17 NOTE — Therapy (Signed)
[facility] Physical Therapy [address] [place], [place], [address] Phone: [phone]   Fax:  [phone]  Physical Therapy Treatment  Patient Details  Name: Luis Lopez MRN: [id number] Date of Birth: Feb 03, 1976 Referring Provider (PT): Erlinda Hong, MD   Encounter Date: 12/17/2020   PT End of Session - 12/17/20 1013     Visit Number 6    Number of Visits 18    Date for PT Re-Evaluation 01/08/21    PT Start Time 1013    PT Stop Time 2482    PT Time Calculation (min) 40 min    Activity Tolerance Patient tolerated treatment well    Behavior During Therapy [facility] for tasks assessed/performed             Past Medical History:  Diagnosis Date   Diabetes mellitus without complication ([place])    Hypertension    borderline   PVC (premature ventricular contraction) 04/29/2018   Sleep apnea    uses a cpap   Snoring     Past Surgical History:  Procedure Laterality Date   INSERTION OF MESH N/A 11/03/2017   Procedure: INSERTION OF MESH;  Surgeon: Erroll Luna, MD;  Location: [place];  Service: General;  Laterality: N/A;   SHOULDER ACROMIOPLASTY Right 10/29/2020   Procedure: RIGHT SHOULDER ARTHROSCOPY, EXTENSIVE DEBRIDEMENT, DISTAL CLAVICLE EXCISION, SUBACROMIAL DECOMPRESSION, BICEPS TENODESIS;  Surgeon: Leandrew Koyanagi, MD;  Location: [place];  Service: Orthopedics;  Laterality: Right;   UMBILICAL HERNIA REPAIR  50/05/7046   UMBILICAL HERNIA REPAIR N/A 11/03/2017   Procedure: UMBILICAL HERNIA REPAIR WITH MESH;  Surgeon: Erroll Luna, MD;  Location: [place];  Service: General;  Laterality: N/A;    There were no vitals filed for this visit.   Subjective Assessment - 12/17/20 1017     Subjective Pt. indicated he had no pain upon arrival today.  Pt. stated feeling like he could reach better during the day overal.    Pertinent History s/p right shoulder arthroscopy biceps tenodesis with loop and tack as well as arthroscopic DCE, extensive debridement,  subacromial decompression but no actual rotator cuff repair. by Dr. Erlinda Hong on 10/29/2020.    Limitations Lifting;House hold activities    Patient Stated Goals get back to normal    Currently in Pain? No/denies    Pain Score 0-No pain    Pain Onset More than a month ago                [facility] PT Assessment - 12/17/20 0001       Assessment   Medical Diagnosis s/p right shoulder arthroscopy biceps tenodesis with loop and tack as well as arthroscopic DCE, extensive debridement, subacromial decompression but no actual rotator cuff repair.    Referring Provider (PT) Erlinda Hong, MD    Onset Date/Surgical Date 10/29/20    Hand Dominance Right      Strength   Right Shoulder Flexion 3/5   able to move against gravity c mild shrug above 100 degrees                          [facility] Adult PT Treatment/Exercise - 12/17/20 0001       Shoulder Exercises: Supine   Other Supine Exercises transitioned to against gravity movement today in clinic      Shoulder Exercises: Standing   External Rotation Right   3 x 10, towel at side   Theraband Level (Shoulder External Rotation) Level 2 (Red)    Internal  Rotation Right   3 x 10, towel at side   Theraband Level (Shoulder Internal Rotation) Level 2 (Red)    Flexion Both   mirror feedback, 0-90 degrees 2 x 15   Row 20 reps;Both   green c scap retraction hold 5 seconds   Other Standing Exercises standing shoulder flexion 0-110 degrees 1 lb bar 2 x 10, standing abduction c wand 1 lb 5 sec hold x 15      Shoulder Exercises: Pulleys   Flexion 3 minutes    ABduction 3 minutes    Other Pulley Exercises 5 second holds      Shoulder Exercises: ROM/Strengthening   UBE (Upper Arm Bike) Lvl 4 3 mins fwd/back each way                       PT Short Term Goals - 12/17/20 1018       PT SHORT TERM GOAL #1   Title Pt will be I and compliant with HEP.    Time 4    Period Weeks    Status Achieved    Target Date 12/11/20      PT SHORT  TERM GOAL #2   Title Pt will improve PROM Rt shoulder  to [facility]    Time 4    Period Weeks    Status Partially Met    Target Date 12/11/20               PT Long Term Goals - 12/17/20 1018       PT LONG TERM GOAL #1   Title Pt will improve Rt shoulder AROM to [facility].    Time 8    Period Weeks    Status On-going    Target Date 01/08/21      PT LONG TERM GOAL #2   Title Pt will improve Rt shoulder strength to 5/5 MMT to improve functional lifting    Time 8    Period Weeks    Status On-going    Target Date 01/08/21      PT LONG TERM GOAL #3   Title Pt will improve FOTO score to 70% functional.    Time 8    Period Weeks    Status On-going    Target Date 01/08/21      PT LONG TERM GOAL #4   Title Pt will I and compliant with final HEP.    Time 8    Period Weeks    Status On-going    Target Date 01/08/21      PT LONG TERM GOAL #5   Title Pt will report less than 3/10 overall pain with reaching overhead and behind back and performing usual ADL's, and being able to return to bowling.    Time 8    Period Weeks    Status On-going    Target Date 01/08/21                   Plan - 12/17/20 1027     Clinical Impression Statement Pt. was able to transition to hybrid between gravity reduced and against gravity AROM.  Shrug noted and to be avoided in standing activity (above 100 degrees).  Continued HEP and clinic visits indicated at this time to continue to improve function.    Personal Factors and Comorbidities Comorbidity 2    Comorbidities DM, HTN    Examination-Activity Limitations Lift;Bathing;Bed Mobility;Carry;Sleep    Stability/Clinical Decision Making Stable/Uncomplicated    Rehab Potential  Good    PT Frequency 2x / week    PT Duration 8 weeks    PT Treatment/Interventions ADLs/Self Care Home Management;Cryotherapy;Electrical Stimulation;Iontophoresis 64m/ml Dexamethasone;Moist Heat;Ultrasound;Functional mobility training;Therapeutic activities;Therapeutic  exercise;Neuromuscular re-education;Patient/family education;Manual techniques;Taping    PT Next Visit Plan Progressive active range against gravity strengthening avoiding shrug.  Mobility gains continued c ther ex and manual.    PT Home Exercise Plan Access Code: [email or web address]   Consulted and Agree with Plan of Care Patient             Patient will benefit from skilled therapeutic intervention in order to improve the following deficits and impairments:  Decreased activity tolerance, Decreased mobility, Decreased range of motion, Decreased strength, Increased edema, Increased muscle spasms, Impaired flexibility, Pain, Impaired UE functional use  Visit Diagnosis: Acute pain of right shoulder  Stiffness of right shoulder, not elsewhere classified  Muscle weakness (generalized)  Localized edema     Problem List Patient Active Problem List   Diagnosis Date Noted   Adhesive capsulitis of right shoulder 12/02/2020   Chronic right shoulder pain 12/02/2020   Nontraumatic tear of supraspinatus tendon, right 10/29/2020   Degenerative superior labral anterior-to-posterior (SLAP) tear of right shoulder 10/29/2020   Arthrosis of right acromioclavicular joint 10/29/2020   Muscle spasm 07/26/2018   Acute bilateral low back pain without sciatica 07/26/2018   PVC (premature ventricular contraction) 04/29/2018   Abnormal EKG 03/01/2018   Essential hypertension 136/48/3893  Umbilical hernia 006/84/0502  Mor[place] 11/06/2015   Nocturia more than twice per night 11/06/2015   Sleep deprivation 11/06/2015   Hypersomnia with sleep apnea 11/06/2015   OSA (obstructive sleep apnea) 11/06/2015    MGirtha Rm PT 12/17/2020, 10:53 AM  [place] [address]Phone: [phone]  Fax:  [phone] Name: JHeraclio SeidmanMRN: [id number] of Birth: 3Mar 23, 1977

## 2020-12-22 ENCOUNTER — Encounter: Payer: 59 | Admitting: Physical Therapy

## 2020-12-24 ENCOUNTER — Encounter: Payer: 59 | Admitting: Physical Therapy

## 2020-12-29 ENCOUNTER — Encounter: Payer: 59 | Admitting: Physical Therapy

## 2020-12-31 ENCOUNTER — Ambulatory Visit (INDEPENDENT_AMBULATORY_CARE_PROVIDER_SITE_OTHER): Payer: 59 | Admitting: Physical Therapy

## 2020-12-31 ENCOUNTER — Other Ambulatory Visit: Payer: Self-pay

## 2020-12-31 DIAGNOSIS — R600 Localized edema: Secondary | ICD-10-CM

## 2020-12-31 DIAGNOSIS — M25511 Pain in right shoulder: Secondary | ICD-10-CM | POA: Diagnosis not present

## 2020-12-31 DIAGNOSIS — M25611 Stiffness of right shoulder, not elsewhere classified: Secondary | ICD-10-CM | POA: Diagnosis not present

## 2020-12-31 DIAGNOSIS — M6281 Muscle weakness (generalized): Secondary | ICD-10-CM | POA: Diagnosis not present

## 2020-12-31 NOTE — Therapy (Signed)
[facility] Physical Therapy [address] [place], [place], [address] Phone: [phone]   Fax:  [phone]  Physical Therapy Treatment  Patient Details  Name: Luis Lopez MRN: [id number] Date of Birth: [birth year] Referring Provider (PT): Erlinda Hong, MD   Encounter Date: 12/31/2020   PT End of Session - 12/31/20 1054     Visit Number 7    Number of Visits 18    Date for PT Re-Evaluation 01/08/21    PT Start Time 1030   arrives late to 1015 appt   PT Stop Time 1100    PT Time Calculation (min) 30 min    Activity Tolerance Patient tolerated treatment well    Behavior During Therapy [facility] for tasks assessed/performed             Past Medical History:  Diagnosis Date   Diabetes mellitus without complication ([place])    Hypertension    borderline   PVC (premature ventricular contraction) 04/29/2018   Sleep apnea    uses a cpap   Snoring     Past Surgical History:  Procedure Laterality Date   INSERTION OF MESH N/A 11/03/2017   Procedure: INSERTION OF MESH;  Surgeon: Erroll Luna, MD;  Location: [place];  Service: General;  Laterality: N/A;   SHOULDER ACROMIOPLASTY Right 10/29/2020   Procedure: RIGHT SHOULDER ARTHROSCOPY, EXTENSIVE DEBRIDEMENT, DISTAL CLAVICLE EXCISION, SUBACROMIAL DECOMPRESSION, BICEPS TENODESIS;  Surgeon: Leandrew Koyanagi, MD;  Location: [place];  Service: Orthopedics;  Laterality: Right;   UMBILICAL HERNIA REPAIR  67/20/9470   UMBILICAL HERNIA REPAIR N/A 11/03/2017   Procedure: UMBILICAL HERNIA REPAIR WITH MESH;  Surgeon: Erroll Luna, MD;  Location: [place];  Service: General;  Laterality: N/A;    There were no vitals filed for this visit.   Subjective Assessment - 12/31/20 1035     Subjective relays not having pain upon arrival, still with some shoulder stiffness .    Pertinent History s/p right shoulder arthroscopy biceps tenodesis with loop and tack as well as arthroscopic DCE, extensive debridement, subacromial  decompression but no actual rotator cuff repair. by Dr. Erlinda Hong on 10/29/2020.    Limitations Lifting;House hold activities    Patient Stated Goals get back to normal    Pain Onset More than a month ago                [facility] PT Assessment - 12/31/20 0001       Assessment   Medical Diagnosis s/p right shoulder arthroscopy biceps tenodesis with loop and tack as well as arthroscopic DCE, extensive debridement, subacromial decompression but no actual rotator cuff repair.    Referring Provider (PT) Erlinda Hong, MD    Onset Date/Surgical Date 10/29/20    Hand Dominance Right      AROM   Overall AROM Comments measured in supine    Right Shoulder Flexion 140 Degrees    Right Shoulder ABduction 110 Degrees    Right Shoulder Internal Rotation 50 Degrees    Right Shoulder External Rotation 50 Degrees      PROM   Right Shoulder Flexion 150 Degrees    Right Shoulder ABduction 115 Degrees    Right Shoulder Internal Rotation 55 Degrees    Right Shoulder External Rotation 50 Degrees      Strength   Right Shoulder Flexion 4+/5    Right Shoulder ABduction 4/5    Right Shoulder Internal Rotation 4+/5    Right Shoulder External Rotation 4/5  [place] Adult PT Treatment/Exercise - 12/31/20 0001       Shoulder Exercises: Supine   Flexion Right;15 reps    Shoulder Flexion Weight (lbs) 3    Flexion Limitations 5 sec hold      Shoulder Exercises: Sidelying   External Rotation Right;10 reps    External Rotation Weight (lbs) 3    External Rotation Limitations 2 sets    ABduction Right;15 reps    ABduction Weight (lbs) 3    ABduction Limitations holding 5 sec      Shoulder Exercises: Pulleys   Flexion 2 minutes    ABduction 2 minutes    Other Pulley Exercises 5 second holds      Shoulder Exercises: ROM/Strengthening   UBE (Upper Arm Bike) Lvl 4 2.5 mins fwd/back each way      Manual Therapy   Manual therapy comments g3-4 infferior joint mobs in flexion,  scaption and abduction, Mobilization c movement for ER in 45 deg abduction c sustained posterior glide movement to tolerance, PROM all planes to his tolerance                       PT Short Term Goals - 12/17/20 1018       PT SHORT TERM GOAL #1   Title Pt will be I and compliant with HEP.    Time 4    Period Weeks    Status Achieved    Target Date 12/11/20      PT SHORT TERM GOAL #2   Title Pt will improve PROM Rt shoulder  to [facility]    Time 4    Period Weeks    Status Partially Met    Target Date 12/11/20               PT Long Term Goals - 12/31/20 1104       PT LONG TERM GOAL #1   Title Pt will improve Rt shoulder AROM to [facility].    Baseline stil with [place] stiffeness limiting ROM    Period Weeks    Status On-going      PT LONG TERM GOAL #2   Baseline now 4-4+    Status On-going      PT LONG TERM GOAL #3   Title Pt will improve FOTO score to 70% functional.    Status On-going      PT LONG TERM GOAL #4   Title Pt will I and compliant with final HEP.    Status On-going      PT LONG TERM GOAL #5   Title Pt will report less than 3/10 overall pain with reaching overhead and behind back and performing usual ADL's, and being able to return to bowling.    Status On-going                   Plan - 12/31/20 1056     Clinical Impression Statement Shorter session as he arrives 15 min late. He has missed several appointments so we will allow him to schedule 1-2 visits at a time due to our attendance policy. He has progressed well with strength but is progressing slowly with ROM and has capuslar tighntess developing frozen shoulder. He was encouraged to attend PT more regularly and to stretch more at home.    Personal Factors and Comorbidities Comorbidity 2    Comorbidities DM, HTN    Examination-Activity Limitations Lift;Bathing;Bed Mobility;Carry;Sleep    Stability/Clinical Decision Making Stable/Uncomplicated    Rehab  Potential Good    PT Frequency 2x  / week    PT Duration 8 weeks    PT Treatment/Interventions ADLs/Self Care Home Management;Cryotherapy;Electrical Stimulation;Iontophoresis 43m/ml Dexamethasone;Moist Heat;Ultrasound;Functional mobility training;Therapeutic activities;Therapeutic exercise;Neuromuscular re-education;Patient/family education;Manual techniques;Taping    PT Next Visit Plan Mobility focus c ther ex and manual. RTC strength    PT Home Exercise Plan Access Code: [email or web address]   Consulted and Agree with Plan of Care Patient             Patient will benefit from skilled therapeutic intervention in order to improve the following deficits and impairments:  Decreased activity tolerance, Decreased mobility, Decreased range of motion, Decreased strength, Increased edema, Increased muscle spasms, Impaired flexibility, Pain, Impaired UE functional use  Visit Diagnosis: Acute pain of right shoulder  Stiffness of right shoulder, not elsewhere classified  Muscle weakness (generalized)  Localized edema     Problem List Patient Active Problem List   Diagnosis Date Noted   Adhesive capsulitis of right shoulder 12/02/2020   Chronic right shoulder pain 12/02/2020   Nontraumatic tear of supraspinatus tendon, right 10/29/2020   Degenerative superior labral anterior-to-posterior (SLAP) tear of right shoulder 10/29/2020   Arthrosis of right acromioclavicular joint 10/29/2020   Muscle spasm 07/26/2018   Acute bilateral low back pain without sciatica 07/26/2018   PVC (premature ventricular contraction) 04/29/2018   Abnormal EKG 03/01/2018   Essential hypertension 159/73/3125  Umbilical hernia 008/71/9941  Morbid [place] due to excess calories ([place] 11/06/2015   Nocturia more than twice per night 11/06/2015   Sleep deprivation 11/06/2015   Hypersomnia with sleep apnea 11/06/2015   OSA (obstructive sleep apnea) 11/06/2015    BDebbe Odea PT,DPT 12/31/2020, 11:08 AM  [facility] [address] [address]Phone: [phone]  Fax:  [phone] Name: Luis SalekMRN: [id number] of Birth: [birth year]

## 2021-01-05 ENCOUNTER — Encounter: Payer: 59 | Admitting: Physical Therapy

## 2021-01-07 ENCOUNTER — Encounter: Payer: 59 | Admitting: Physical Therapy

## 2021-01-08 ENCOUNTER — Ambulatory Visit (INDEPENDENT_AMBULATORY_CARE_PROVIDER_SITE_OTHER): Payer: 59 | Admitting: Rehabilitative and Restorative Service Providers"

## 2021-01-08 ENCOUNTER — Encounter: Payer: Self-pay | Admitting: Rehabilitative and Restorative Service Providers"

## 2021-01-08 ENCOUNTER — Other Ambulatory Visit: Payer: Self-pay

## 2021-01-08 DIAGNOSIS — R600 Localized edema: Secondary | ICD-10-CM | POA: Diagnosis not present

## 2021-01-08 DIAGNOSIS — M25511 Pain in right shoulder: Secondary | ICD-10-CM

## 2021-01-08 DIAGNOSIS — M25611 Stiffness of right shoulder, not elsewhere classified: Secondary | ICD-10-CM

## 2021-01-08 DIAGNOSIS — M6281 Muscle weakness (generalized): Secondary | ICD-10-CM

## 2021-01-08 NOTE — Therapy (Addendum)
[facility] Physical Therapy [address] [place], [place], [address] Phone: [phone]   Fax:  [phone]  Physical Therapy Treatment/Progress Note/DC  Patient Details  Name: Luis Lopez MRN: [id number] Date of Birth: [birth year] Referring Provider (PT): Erlinda Hong, MD  Progress Note Reporting Period 11/13/2020 to 01/08/2021  See note below for Objective Data and Assessment of Progress/Goals.   PHYSICAL THERAPY DISCHARGE SUMMARY  Visits from Start of Care: 8  Current functional level related to goals / functional outcomes: See note   Remaining deficits: See note   Education / Equipment: HEP   Patient agrees to discharge. Patient goals were partially met. Patient is being discharged due to not returning since the last visit.     Encounter Date: 01/08/2021   PT End of Session - 01/08/21 1009     Visit Number 8    Number of Visits 18    Date for PT Re-Evaluation 01/08/21    Progress Note Due on Visit 18    PT Start Time 0812    PT Stop Time 0850    PT Time Calculation (min) 38 min    Activity Tolerance Patient tolerated treatment well;No increased pain    Behavior During Therapy WFL for tasks assessed/performed             Past Medical History:  Diagnosis Date   Diabetes mellitus without complication ([place])    Hypertension    borderline   PVC (premature ventricular contraction) 04/29/2018   Sleep apnea    uses a cpap   Snoring     Past Surgical History:  Procedure Laterality Date   INSERTION OF MESH N/A 11/03/2017   Procedure: INSERTION OF MESH;  Surgeon: Erroll Luna, MD;  Location: [place];  Service: General;  Laterality: N/A;   SHOULDER ACROMIOPLASTY Right 10/29/2020   Procedure: RIGHT SHOULDER ARTHROSCOPY, EXTENSIVE DEBRIDEMENT, DISTAL CLAVICLE EXCISION, SUBACROMIAL DECOMPRESSION, BICEPS TENODESIS;  Surgeon: Leandrew Koyanagi, MD;  Location: [place];  Service: Orthopedics;  Laterality: Right;   UMBILICAL HERNIA REPAIR   95/28/4132   UMBILICAL HERNIA REPAIR N/A 11/03/2017   Procedure: UMBILICAL HERNIA REPAIR WITH MESH;  Surgeon: Erroll Luna, MD;  Location: [place];  Service: General;  Laterality: N/A;    There were no vitals filed for this visit.   Subjective Assessment - 01/08/21 1004     Subjective Luis Lopez reports pain is minimal.  He is most interested in continuing to improve his R shoulder AROM and strength.    Pertinent History s/p right shoulder arthroscopy biceps tenodesis with loop and tack as well as arthroscopic DCE, extensive debridement, subacromial decompression but no actual rotator cuff repair. by Dr. Erlinda Hong on 10/29/2020.    Limitations Lifting;House hold activities    Patient Stated Goals get back to normal R shoulder function    Currently in Pain? No/denies    Pain Score 0-No pain    Pain Location Shoulder    Pain Orientation Right    Pain Descriptors / Indicators Tightness    Pain Radiating Towards NA    Pain Onset More than a month ago    Pain Frequency Rarely    Aggravating Factors  End range AROM    Pain Relieving Factors Ice and exercises    Multiple Pain Sites No                [facility] PT Assessment - 01/08/21 0001       Assessment   Medical Diagnosis s/p right shoulder arthroscopy biceps tenodesis with loop  and tack as well as arthroscopic DCE, extensive debridement, subacromial decompression but no actual rotator cuff repair.    Referring Provider (PT) Erlinda Hong, MD    Onset Date/Surgical Date 10/29/20    Hand Dominance Right      Observation/Other Assessments   Focus on Therapeutic Outcomes (FOTO)  59 (was 41, Goal 70)      AROM   Overall AROM Comments measured in supine    AROM Assessment Site Shoulder    Right/Left Shoulder Right    Right Shoulder Flexion 145 Degrees    Right Shoulder Internal Rotation 20 Degrees    Right Shoulder External Rotation 45 Degrees    Right Shoulder Horizontal  ADduction 35 Degrees      Strength   Overall Strength Deficits    Strength  Assessment Site Shoulder    Right/Left Shoulder Left;Right    Right Shoulder Internal Rotation --   26.7 pounds   Right Shoulder External Rotation --   20.8 pounds   Left Shoulder Internal Rotation --   54.4 pounds   Left Shoulder External Rotation --   40.7 pounds                          [facility] Adult PT Treatment/Exercise - 01/08/21 0001       Exercises   Exercises Shoulder      Shoulder Exercises: Supine   External Rotation AROM;Right;10 reps;Limitations    External Rotation Limitations 10 seconds    Internal Rotation AROM;Right;10 reps;Limitations    Internal Rotation Limitations 10 seconds      Shoulder Exercises: Seated   Flexion AAROM;Right;10 reps;Limitations    Flexion Limitations 10 seconds (pulley-reach out 1st to decompress shoulder, keeping palm facing in) 10 second hold      Shoulder Exercises: Standing   Other Standing Exercises Posterior capsule stretch and thumb up the back 10X 10 seconds each      Shoulder Exercises: Pulleys   Other Pulley Exercises See above                     PT Education - 01/08/21 1005     Education Details Reviewed RA findings with emphasis on capsular flexibility and AROM with his HEP.  Strength is still important although AROM remains a higher priority.    Person(s) Educated Patient    Methods Explanation;Demonstration;Tactile cues;Verbal cues;Handout    Comprehension Verbalized understanding;Tactile cues required;Need further instruction;Returned demonstration;Verbal cues required              PT Short Term Goals - 01/08/21 1006       PT SHORT TERM GOAL #1   Title Pt will be I and compliant with HEP.    Time 4    Period Weeks    Status Achieved    Target Date 12/11/20      PT SHORT TERM GOAL #2   Title Pt will improve PROM Rt shoulder  to [facility]    Time 4    Period Weeks    Status Partially Met    Target Date 02/05/21               PT Long Term Goals - 01/08/21 1006       PT  LONG TERM GOAL #1   Title Pt will improve Rt shoulder AROM to [facility].    Baseline See objective (limits in all directions, particularly rotation)    Time 4    Period Weeks  Status On-going    Target Date 02/05/21      PT LONG TERM GOAL #2   Title Pt will improve Rt shoulder strength to 5/5 MMT to improve functional lifting    Baseline See objective (~50% R shoulder strength)    Time 4    Period Weeks    Status On-going    Target Date 02/05/21      PT LONG TERM GOAL #3   Title Pt will improve FOTO score to 70% functional.    Baseline 59 (was 41)    Time 4    Period Weeks    Status On-going    Target Date 02/05/21      PT LONG TERM GOAL #4   Title Pt will I and compliant with final HEP.    Time 4    Period Weeks    Status On-going    Target Date 02/05/21      PT LONG TERM GOAL #5   Title Pt will report less than 3/10 overall pain with reaching overhead and behind back and performing usual ADL's, and being able to return to bowling.    Baseline No pain but limited with behind the back and ADLs.    Time 4    Period Weeks    Status On-going    Target Date 02/05/21                   Plan - 01/08/21 1009     Clinical Impression Statement Luis Lopez is making objective progress towards long-term AROM and strength goals.  We reviewed his HEP with verbal reminders for activities not performed today.  I added 2 activities Luis Lopez at work in addition to focusing on IR, ER and flexion AROM with his HEP (5 exercises prioritized to increase ease of compliance).  With consistent attendance in PT and HEP compliance, his prognosis remains good.    Personal Factors and Comorbidities Comorbidity 2    Comorbidities DM, HTN    Examination-Activity Limitations Lift;Bathing;Bed Mobility;Carry;Sleep    Examination-Participation Restrictions Cleaning;Occupation;Driving;Laundry    Stability/Clinical Decision Making Stable/Uncomplicated    Rehab Potential Good    PT Frequency 2x / week     PT Duration 6 weeks    PT Treatment/Interventions ADLs/Self Care Home Management;Cryotherapy;Electrical Stimulation;Iontophoresis 73m/ml Dexamethasone;Moist Heat;Ultrasound;Functional mobility training;Therapeutic activities;Therapeutic exercise;Neuromuscular re-education;Patient/family education;Manual techniques;Taping    PT Next Visit Plan AROM and capsular flexibility emphasis with strength as time allows    PT Home Exercise Plan Access Code: [email or web address] emphasis on AROM and capsular flexibility    Consulted and Agree with Plan of Care Patient             Patient will benefit from skilled therapeutic intervention in order to improve the following deficits and impairments:  Decreased activity tolerance, Decreased mobility, Decreased range of motion, Decreased strength, Increased edema, Increased muscle spasms, Impaired flexibility, Pain, Impaired UE functional use  Visit Diagnosis: Stiffness of right shoulder, not elsewhere classified  Acute pain of right shoulder  Muscle weakness (generalized)  Localized edema     Problem List Patient Active Problem List   Diagnosis Date Noted   Adhesive capsulitis of right shoulder 12/02/2020   Chronic right shoulder pain 12/02/2020   Nontraumatic tear of supraspinatus tendon, right 10/29/2020   Degenerative superior labral anterior-to-posterior (SLAP) tear of right shoulder 10/29/2020   Arthrosis of right acromioclavicular joint 10/29/2020   Muscle spasm 07/26/2018   Acute bilateral low back pain without sciatica 07/26/2018   PVC (premature  ventricular contraction) 04/29/2018   Abnormal EKG 03/01/2018   Essential hypertension 96/94/0982   Umbilical hernia 86/75/1982   Morbid obesity due to excess calories ([place]) 11/06/2015   Nocturia more than twice per night 11/06/2015   Sleep deprivation 11/06/2015   Hypersomnia with sleep apnea 11/06/2015   OSA (obstructive sleep apnea) 11/06/2015    Farley Ly, PT, MPT 01/08/2021, 10:13  AM  [facility] Physical Therapy [address] [place], [place], [address] Phone: [phone]   Fax:  [phone]  Name: Jaret Coppedge MRN: [id number] Date of Birth: [birth year]

## 2021-01-08 NOTE — Patient Instructions (Signed)
Access Code: [email or web address] URL: [email or web address] Date: 01/08/2021 Prepared by: Vista Mink  Exercises  Supine Shoulder External Rotation Stretch - 2-3 x daily - 7 x weekly - 1 sets - 10-20 reps - 10 seconds hold Supine Shoulder Internal Rotation Stretch - 1 x daily - 7 x weekly - 1 sets - 10-20 reps - 10 seconds hold Standing Shoulder Internal Rotation Stretch with Hands Behind Back - 2-3 x daily - 7 x weekly - 1 sets - 10 reps - 10 seconds hold Standing Shoulder Posterior Capsule Stretch - 2-3 x daily - 7 x weekly - 1 sets - 10 reps - 10 hold

## 2021-01-09 ENCOUNTER — Encounter: Payer: 59 | Admitting: Orthopaedic Surgery

## 2021-01-12 ENCOUNTER — Encounter: Payer: 59 | Admitting: Physical Therapy

## 2021-01-14 ENCOUNTER — Encounter: Payer: 59 | Admitting: Physical Therapy

## 2021-01-21 ENCOUNTER — Telehealth: Payer: Self-pay

## 2021-01-21 ENCOUNTER — Encounter: Payer: 59 | Admitting: Nurse Practitioner

## 2021-01-21 NOTE — Telephone Encounter (Signed)
I called and left pt vm to call the office so we can reschedule his appt he missed today 11/02. YL,RMA

## 2021-01-21 NOTE — Progress Notes (Deleted)
Rich Brave Ll[place]leton,acting as a Education administrator for [name], FNP.,have documented all relevant documentation on the behalf of [name], FNP,as directed by  [name], FNP while in the presence of [name], [place].  This visit occurred during the SARS-CoV-2 public health emergency.  Safety protocols were in place, including screening questions prior to the visit, additional usage of staff PPE, and extensive cleaning of exam room while observing appropriate contact time as indicated for disinfecting solutions.  Subjective:     Patient ID: Luis Lopez , male    DOB: [birth year] , 45 y.o.   MRN: [id number]   Chief Complaint  Patient presents with   Diabetes   Hypertension    HPI  Pt is here today for b/p and diabetes check.    Diabetes He presents for his follow-up diabetic visit. He has type 2 diabetes mellitus. Pertinent negatives for hypoglycemia include no headaches. There are no diabetic associated symptoms. Pertinent negatives for diabetes include no chest pain. There are no diabetic complications. Risk factors for coronary artery disease include male sex and obesity. Current diabetic treatment includes diet and oral agent (dual therapy).  Hypertension This is a chronic problem. The current episode started more than 1 year ago. The problem has been gradually worsening since onset. The problem is uncontrolled. Pertinent negatives include no anxiety, chest pain, headaches, malaise/fatigue or palpitations. Risk factors for coronary artery disease include obesity and sedentary lifestyle. Treatments tried: Has been without his medications since April.  There are no compliance problems.  There is no history of angina. There is no history of chronic renal disease.    Past Medical History:  Diagnosis Date   Diabetes mellitus without complication ([place])    Hypertension    borderline   PVC (premature ventricular contraction) 04/29/2018   Sleep apnea    uses a cpap   Snoring      Family  History  Problem Relation Age of Onset   Diabetes Father    Diabetes Brother    Hypertension Brother    Diabetes Sister    Hypertension Sister    Aneurysm Paternal Grandmother        brain     Current Outpatient Medications:    aspirin EC 81 MG tablet, Take 81 mg by mouth daily., Disp: , Rfl:    Glucagon (GVOKE HYPOPEN 2-PACK) 0.5 MG/0.1ML SOAJ, Inject 0.5 mg into the skin as needed., Disp: 0.2 mL, Rfl: 2   hydrochlorothiazide (HYDRODIURIL) 25 MG tablet, Take 1 tablet (25 mg total) by mouth daily., Disp: 90 tablet, Rfl: 1   ibuprofen (ADVIL) 800 MG tablet, Take 1 tablet (800 mg total) by mouth 3 (three) times daily., Disp: 21 tablet, Rfl: 0   losartan (COZAAR) 25 MG tablet, TAKE 1 TABLET(25 MG) BY MOUTH DAILY, Disp: 90 tablet, Rfl: 1   metFORMIN (GLUCOPHAGE) 500 MG tablet, TAKE 1 TABLET(500 MG) BY MOUTH TWICE DAILY WITH A MEAL, Disp: 60 tablet, Rfl: 2   ondansetron (ZOFRAN) 4 MG tablet, Take 1-2 tablets (4-8 mg total) by mouth every 8 (eight) hours as needed for nausea or vomiting., Disp: 20 tablet, Rfl: 0   oxyCODONE-acetaminophen (PERCOCET) 5-325 MG tablet, Take 1-2 tablets by mouth every 8 (eight) hours as needed for severe pain., Disp: 30 tablet, Rfl: 0   OZEMPIC, 0.25 OR 0.5 MG/DOSE, 2 MG/1.5ML SOPN, INJECT 0.5 MG INTO THE SKIN ONCE A WEEK, Disp: 4.5 mL, Rfl: 1   UNABLE TO FIND, CPAP: At bedtime, Disp: , Rfl:    No  Known Allergies   Review of Systems  Constitutional:  Negative for malaise/fatigue.  Cardiovascular:  Negative for chest pain and palpitations.  Neurological:  Negative for headaches.    There were no vitals filed for this visit. There is no height or weight on file to calculate BMI.   Objective:  Physical Exam      Assessment And Plan:     1. Type 2 diabetes mellitus with other specified complication, without long-term current use of insulin ([place])  2. Essential hypertension     Patient was given opportunity to ask questions. Patient verbalized  understanding of the plan and was able to repeat key elements of the plan. All questions were answered to their satisfaction.  Sheppard Evens [place], CMA   I, [place], CMA, have reviewed all documentation for this visit. The documentation on 01/21/21 for the exam, diagnosis, procedures, and orders are all accurate and complete.   IF YOU HAVE BEEN REFERRED TO A SPECIALIST, IT MAY TAKE 1-2 WEEKS TO SCHEDULE/PROCESS THE REFERRAL. IF YOU HAVE NOT HEARD FROM US/SPECIALIST IN TWO WEEKS, PLEASE GIVE [place] A CALL AT [phone].   THE PATIENT IS ENCOURAGED TO PRACTICE SOCIAL DISTANCING DUE TO THE COVID-19 PANDEMIC.

## 2021-02-18 ENCOUNTER — Encounter: Payer: Self-pay | Admitting: Orthopaedic Surgery

## 2021-02-19 ENCOUNTER — Other Ambulatory Visit: Payer: Self-pay | Admitting: Physician Assistant

## 2021-02-19 MED ORDER — TRAMADOL HCL 50 MG PO TABS: 50.0000 mg | ORAL_TABLET | Freq: Two times a day (BID) | ORAL | 0 refills | Status: DC | PRN

## 2021-02-19 NOTE — Telephone Encounter (Signed)
Sent in tramadol

## 2021-05-06 ENCOUNTER — Other Ambulatory Visit: Payer: Self-pay

## 2021-05-06 ENCOUNTER — Ambulatory Visit (INDEPENDENT_AMBULATORY_CARE_PROVIDER_SITE_OTHER): Payer: 59 | Admitting: Nurse Practitioner

## 2021-05-06 ENCOUNTER — Encounter: Payer: Self-pay | Admitting: Nurse Practitioner

## 2021-05-06 ENCOUNTER — Other Ambulatory Visit (HOSPITAL_COMMUNITY): Payer: Self-pay

## 2021-05-06 VITALS — BP 128/72 | HR 76 | Temp 98.2°F | Ht 75.0 in | Wt 345.6 lb

## 2021-05-06 DIAGNOSIS — I119 Hypertensive heart disease without heart failure: Secondary | ICD-10-CM

## 2021-05-06 DIAGNOSIS — E1169 Type 2 diabetes mellitus with other specified complication: Secondary | ICD-10-CM | POA: Diagnosis not present

## 2021-05-06 DIAGNOSIS — I2510 Atherosclerotic heart disease of native coronary artery without angina pectoris: Secondary | ICD-10-CM | POA: Diagnosis not present

## 2021-05-06 DIAGNOSIS — Z23 Encounter for immunization: Secondary | ICD-10-CM | POA: Diagnosis not present

## 2021-05-06 DIAGNOSIS — Z6841 Body Mass Index (BMI) 40.0 and over, adult: Secondary | ICD-10-CM

## 2021-05-06 DIAGNOSIS — S62102D Fracture of unspecified carpal bone, left wrist, subsequent encounter for fracture with routine healing: Secondary | ICD-10-CM

## 2021-05-06 DIAGNOSIS — I10 Essential (primary) hypertension: Secondary | ICD-10-CM

## 2021-05-06 LAB — CMP14+EGFR
ALT: 24 IU/L (ref 0–44)
AST: 16 IU/L (ref 0–40)
Albumin/Globulin Ratio: 1.4 (ref 1.2–2.2)
Albumin: 4.3 g/dL (ref 4.0–5.0)
Alkaline Phosphatase: 149 IU/L — ABNORMAL HIGH (ref 44–121)
BUN/Creatinine Ratio: 13 (ref 9–20)
BUN: 12 mg/dL (ref 6–24)
Bilirubin Total: 0.5 mg/dL (ref 0.0–1.2)
CO2: 25 mmol/L (ref 20–29)
Calcium: 9.3 mg/dL (ref 8.7–10.2)
Chloride: 101 mmol/L (ref 96–106)
Creatinine, Ser: 0.89 mg/dL (ref 0.76–1.27)
Globulin, Total: 3 g/dL (ref 1.5–4.5)
Glucose: 131 mg/dL — ABNORMAL HIGH (ref 70–99)
Potassium: 4.2 mmol/L (ref 3.5–5.2)
Sodium: 139 mmol/L (ref 134–144)
Total Protein: 7.3 g/dL (ref 6.0–8.5)
eGFR: 109 mL/min/{1.73_m2} (ref >59)

## 2021-05-06 LAB — LIPID PANEL
Chol/HDL Ratio: 4.1 ratio (ref 0.0–5.0)
Cholesterol, Total: 146 mg/dL (ref 100–199)
HDL: 36 mg/dL — ABNORMAL LOW (ref >39)
LDL Chol Calc (NIH): 95 mg/dL (ref 0–99)
Triglycerides: 76 mg/dL (ref 0–149)
VLDL Cholesterol Cal: 15 mg/dL (ref 5–40)

## 2021-05-06 LAB — HEMOGLOBIN A1C
Est. average glucose Bld gHb Est-mCnc: 137 mg/dL
Hgb A1c MFr Bld: 6.4 % — ABNORMAL HIGH (ref 4.8–5.6)

## 2021-05-06 MED ORDER — OZEMPIC (0.25 OR 0.5 MG/DOSE) 2 MG/1.5ML ~~LOC~~ SOPN
0.5000 mg | PEN_INJECTOR | SUBCUTANEOUS | 1 refills | Status: DC
  Filled 2021-05-06: qty 4.5, 84d supply, fill #0

## 2021-05-06 MED ORDER — ATORVASTATIN CALCIUM 20 MG PO TABS: 20.0000 mg | ORAL_TABLET | Freq: Every day | ORAL | 2 refills | Status: DC

## 2021-05-06 NOTE — Patient Instructions (Signed)
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
## 2021-05-07 ENCOUNTER — Other Ambulatory Visit: Payer: Self-pay | Admitting: Nurse Practitioner

## 2021-05-08 ENCOUNTER — Encounter: Payer: Self-pay | Admitting: Nurse Practitioner

## 2021-05-11 ENCOUNTER — Other Ambulatory Visit: Payer: Self-pay

## 2021-05-11 ENCOUNTER — Other Ambulatory Visit (HOSPITAL_COMMUNITY): Payer: Self-pay

## 2021-05-11 MED ORDER — OZEMPIC (1 MG/DOSE) 4 MG/3ML ~~LOC~~ SOPN
1.0000 mg | PEN_INJECTOR | SUBCUTANEOUS | 2 refills | Status: DC
  Filled 2021-05-11: qty 3, 28d supply, fill #0
  Filled 2021-06-23 – 2021-07-07 (×2): qty 3, 28d supply, fill #1
  Filled 2021-08-04: qty 3, 28d supply, fill #2

## 2021-05-11 NOTE — Telephone Encounter (Signed)
Yes please send 1 mg weekly start with 18 clicks for 2 weeks then increase to 36 clicks.

## 2021-06-06 ENCOUNTER — Other Ambulatory Visit: Payer: Self-pay | Admitting: Nurse Practitioner

## 2021-06-12 ENCOUNTER — Other Ambulatory Visit: Payer: Self-pay | Admitting: Nurse Practitioner

## 2021-06-12 DIAGNOSIS — I10 Essential (primary) hypertension: Secondary | ICD-10-CM

## 2021-06-23 ENCOUNTER — Other Ambulatory Visit (HOSPITAL_COMMUNITY): Payer: Self-pay

## 2021-07-01 ENCOUNTER — Other Ambulatory Visit (HOSPITAL_COMMUNITY): Payer: Self-pay

## 2021-07-07 ENCOUNTER — Other Ambulatory Visit (HOSPITAL_COMMUNITY): Payer: Self-pay

## 2021-08-04 ENCOUNTER — Other Ambulatory Visit (HOSPITAL_COMMUNITY): Payer: Self-pay

## 2021-08-14 ENCOUNTER — Ambulatory Visit (INDEPENDENT_AMBULATORY_CARE_PROVIDER_SITE_OTHER): Payer: 59 | Admitting: Orthopaedic Surgery

## 2021-08-14 ENCOUNTER — Ambulatory Visit (INDEPENDENT_AMBULATORY_CARE_PROVIDER_SITE_OTHER): Payer: 59

## 2021-08-14 ENCOUNTER — Encounter: Payer: Self-pay | Admitting: Orthopaedic Surgery

## 2021-08-14 VITALS — Ht 75.0 in | Wt 320.0 lb

## 2021-08-14 DIAGNOSIS — M25532 Pain in left wrist: Secondary | ICD-10-CM

## 2021-08-14 NOTE — Progress Notes (Unsigned)
Office Visit Note   Patient: Luis Lopez           Date of Birth: Feb 01, 1976           MRN: [id number] Visit Date: 08/14/2021              Requested by: [name], [address] [place],  [place] [address] PCP: [name], FNP   Assessment & Plan: Visit Diagnoses:  1. Pain in left wrist     Plan: Impression is persistent left wrist swelling following ORIF left scaphoid fracture back in March.  The fracture has healed and at this point, I believe some of his symptoms could be coming from being immobilized for so long in a cast prior to and after surgery.  I would like to start him in hand therapy.  Referral has been made.  He notes he has an upcoming appointment with Dr. Grandville Silos on the 30th.  He will follow-up with [place] as needed.  Follow-Up Instructions: No follow-ups on file.   Orders:  Orders Placed This Encounter  Procedures   XR Wrist Complete Left   No orders of the defined types were placed in this encounter.     Procedures: No procedures performed   Clinical Data: No additional findings.   Subjective: Chief Complaint  Patient presents with   Left Wrist - Injury    HPI patient is a pleasant 46 year old right-hand-dominant gentleman who comes in today for second opinion following an injury to his left wrist.  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  He was seen by Grandville Silos it was noted that he had what sounds like a nondisplaced scaphoid fracture.  And try to treat this nonoperatively in a cast for a period of time but the patient notes that it did not heal and surgery was recommended.  He underwent ORIF left scaphoid fracture by Dr. Grandville Silos on 06/12/2021.  He has been doing relatively well but has had persistent swelling which is causing slight pain.  This primarily occurs while at work.  He has been wearing a removable thumb spica splint.  Review of Systems as  detailed in HPI.  All others reviewed and are negative.   Objective: Vital Signs: Ht '6\' 3"'$  (1.905 m)   Wt (!) 320 lb (145.2 kg)   BMI 40.00 kg/m   Physical Exam well-developed well-nourished gentleman in no acute distress.  Alert and oriented x3.  Ortho Exam left wrist shows mild swelling.  No tenderness to the scaphoid.  He is not able to fully oppose his thumb.  Otherwise good range of motion.  Specialty Comments:  No specialty comments available.  Imaging: No results found.   PMFS History: Patient Active Problem List   Diagnosis Date Noted   Adhesive capsulitis of right shoulder 12/02/2020   Chronic right shoulder pain 12/02/2020   Nontraumatic tear of supraspinatus tendon, right 10/29/2020   Degenerative superior labral anterior-to-posterior (SLAP) tear of right shoulder 10/29/2020   Arthrosis of right acromioclavicular joint 10/29/2020   Muscle spasm 07/26/2018   Acute bilateral low back pain without sciatica 07/26/2018   PVC (premature ventricular contraction) 04/29/2018   Abnormal EKG 03/01/2018   Essential hypertension 89/38/1017   Umbilical hernia 51/04/5850   Morbid obesity due to excess calories ([place]) 11/06/2015   Nocturia more than twice per night 11/06/2015   Sleep deprivation 11/06/2015   Hypersomnia with sleep  apnea 11/06/2015   OSA (obstructive sleep apnea) 11/06/2015   Past Medical History:  Diagnosis Date   Diabetes mellitus without complication (HCC)    Hypertension    borderline   PVC (premature ventricular contraction) 04/29/2018   Sleep apnea    uses a cpap   Snoring     Family History  Problem Relation Age of Onset   Diabetes Father    Diabetes Brother    Hypertension Brother    Diabetes Sister    Hypertension Sister    Aneurysm Paternal Grandmother        brain    Past Surgical History:  Procedure Laterality Date   INSERTION OF MESH N/A 11/03/2017   Procedure: INSERTION OF MESH;  Surgeon: Erroll Luna, MD;  Location: [facility];   Service: General;  Laterality: N/A;   SHOULDER ACROMIOPLASTY Right 10/29/2020   Procedure: RIGHT SHOULDER ARTHROSCOPY, EXTENSIVE DEBRIDEMENT, DISTAL CLAVICLE EXCISION, SUBACROMIAL DECOMPRESSION, BICEPS TENODESIS;  Surgeon: Leandrew Koyanagi, MD;  Location: [facility];  Service: Orthopedics;  Laterality: Right;   UMBILICAL HERNIA REPAIR  69/67/8938   UMBILICAL HERNIA REPAIR N/A 11/03/2017   Procedure: UMBILICAL HERNIA REPAIR WITH MESH;  Surgeon: Erroll Luna, MD;  Location: [facility];  Service: General;  Laterality: N/A;   Social History   Occupational History   Not on file  Tobacco Use   Smoking status: Former    Types: Cigarettes    Quit date: 03/23/2003    Years since quitting: 18.4   Smokeless tobacco: Never  Vaping Use   Vaping Use: Never used  Substance and Sexual Activity   Alcohol use: No   Drug use: No   Sexual activity: Yes    Birth control/protection: None

## 2021-08-19 ENCOUNTER — Encounter: Payer: 59 | Admitting: Occupational Therapy

## 2021-08-31 ENCOUNTER — Other Ambulatory Visit (HOSPITAL_COMMUNITY): Payer: Self-pay

## 2021-08-31 ENCOUNTER — Other Ambulatory Visit: Payer: Self-pay | Admitting: Nurse Practitioner

## 2021-08-31 MED ORDER — OZEMPIC (1 MG/DOSE) 4 MG/3ML ~~LOC~~ SOPN
1.0000 mg | PEN_INJECTOR | SUBCUTANEOUS | 2 refills | Status: DC
  Filled 2021-08-31 – 2021-10-23 (×2): qty 3, 28d supply, fill #0
  Filled 2021-11-11 – 2022-02-26 (×2): qty 3, 28d supply, fill #1

## 2021-09-10 ENCOUNTER — Other Ambulatory Visit (HOSPITAL_COMMUNITY): Payer: Self-pay

## 2021-10-05 ENCOUNTER — Encounter: Payer: 59 | Admitting: Nurse Practitioner

## 2021-10-23 ENCOUNTER — Other Ambulatory Visit (HOSPITAL_COMMUNITY): Payer: Self-pay

## 2021-11-11 ENCOUNTER — Other Ambulatory Visit (HOSPITAL_COMMUNITY): Payer: Self-pay

## 2021-11-12 ENCOUNTER — Other Ambulatory Visit (HOSPITAL_COMMUNITY): Payer: Self-pay

## 2021-11-25 ENCOUNTER — Other Ambulatory Visit (HOSPITAL_COMMUNITY): Payer: Self-pay

## 2021-11-25 ENCOUNTER — Other Ambulatory Visit: Payer: Self-pay

## 2021-11-25 ENCOUNTER — Ambulatory Visit (INDEPENDENT_AMBULATORY_CARE_PROVIDER_SITE_OTHER): Payer: 59

## 2021-11-25 ENCOUNTER — Ambulatory Visit (HOSPITAL_COMMUNITY)
Admission: EM | Admit: 2021-11-25 | Discharge: 2021-11-25 | Disposition: A | Discharge status: Home / Self Care | Payer: 59 | Attending: Internal Medicine | Admitting: Internal Medicine

## 2021-11-25 ENCOUNTER — Encounter (HOSPITAL_COMMUNITY): Payer: Self-pay | Admitting: Emergency Medicine

## 2021-11-25 DIAGNOSIS — M47817 Spondylosis without myelopathy or radiculopathy, lumbosacral region: Secondary | ICD-10-CM | POA: Diagnosis not present

## 2021-11-25 DIAGNOSIS — M545 Low back pain, unspecified: Secondary | ICD-10-CM | POA: Diagnosis not present

## 2021-11-25 DIAGNOSIS — M5137 Other intervertebral disc degeneration, lumbosacral region: Secondary | ICD-10-CM

## 2021-11-25 DIAGNOSIS — M5127 Other intervertebral disc displacement, lumbosacral region: Secondary | ICD-10-CM | POA: Diagnosis not present

## 2021-11-25 MED ORDER — METHYLPREDNISOLONE 4 MG PO TABS: ORAL_TABLET | ORAL | 0 refills | Status: AC

## 2021-11-25 MED ORDER — KETOROLAC TROMETHAMINE 30 MG/ML IJ SOLN
30.0000 mg | Freq: Once | INTRAMUSCULAR | Status: AC
  Administered 2021-11-25: 30 mg via INTRAMUSCULAR

## 2021-11-25 MED ORDER — ACETAMINOPHEN 500 MG PO TABS: 1000.0000 mg | ORAL_TABLET | Freq: Three times a day (TID) | ORAL | 0 refills | Status: AC

## 2021-11-25 MED ORDER — DICLOFENAC SODIUM 1 % EX GEL: 4.0000 g | Freq: Four times a day (QID) | CUTANEOUS | 2 refills | Status: AC

## 2021-11-25 MED ORDER — KETOROLAC TROMETHAMINE 30 MG/ML IJ SOLN
INTRAMUSCULAR | Status: AC
  Filled 2021-11-25: qty 1

## 2021-11-25 MED ORDER — BACLOFEN 10 MG PO TABS: 10.0000 mg | ORAL_TABLET | Freq: Three times a day (TID) | ORAL | 0 refills | Status: AC

## 2021-11-25 NOTE — ED Triage Notes (Signed)
Complains of lower back pain since falling asleep in a chair.  Has a history of sciatic pain.  Reports he has occasional pain around left hip with ambulation

## 2021-11-25 NOTE — Discharge Instructions (Addendum)
The x-ray of your lumbar spine did reveal slight worsening of your known disc disease at L5-S1.  The space between the 2 bones has gotten a little smaller.  I have printed your x-ray report for your records.    Because of this finding, I do think is important that you reach out to your primary care provider to discuss referral to an orthopedic back specialist in the area.  This is not necessarily because you want to have surgery but more so to see if there are some interventional treatments that can prolong the need for surgery by providing you with meaningful pain relief and preserved function.  The mainstay of therapy for musculoskeletal pain is reduction of inflammation and relaxation of tension which is causing inflammation.  Keep in mind, pain always begets more pain.  To help you stay ahead of your pain and inflammation, I have provided the following regimen for you:   During your visit today, you received an injection of ketorolac, high-dose nonsteroidal anti-inflammatory pain medication that should significantly reduce your pain for the next 6 to 8 hours.    Please begin taking Tylenol 1000 mg 3 times daily (every 8 hours) as soon as you pick up your prescriptions from the pharmacy.   This evening, you can begin taking baclofen 10 mg.  This is a highly effective muscle relaxer and antispasmodic which should continue to provide you with relaxation of your tense muscles, allow you to sleep well and to keep your pain under control.  You can continue taking this medication 3 times daily as you need to.  If you find that this medication makes you too sleepy, you can break them in half for your daytime doses and, if needed double them for your nighttime dose.  Do not take more than 30 mg of baclofen in a 24-hour period.   Tomorrow morning, please begin taking methylprednisolone.  Please take all tablets of the daily recommended dose with your breakfast meal.  If you have had significant relation of  your pain before you finish the entire prescription, please feel free to discontinue.  It is not important to finish every dose.   During the day, please set aside time to apply ice to the affected area 4 times daily for 20 minutes each application.  This can be achieved by using a bag of frozen peas or corn, a Ziploc bag filled with ice and water, or Ziploc bag filled with half rubbing alcohol and half Dawn dish detergent, frozen into a slush.  Please be careful not to apply ice directly to your skin, always place a soft cloth between you and the ice pack.   You are welcome to use topical anti-inflammatory creams such as Voltaren gel, capsaicin or Aspercreme as recommended.  These medications are available over-the-counter, please follow manufactures instructions for use.  As a courtesy, I provided you with a prescription for diclofenac in the event that your insurance will pay for this.   Please consider discussing referral to physical therapy with your primary care provider.  Physical therapist are very good at teasing out the underlying cause of acute lower back pain and helping with prevention of future recurrences.   Please avoid attempts to stretch or strengthen the affected area until you are feeling completely pain-free.  Attempts to do so will only prolong the healing process.   If you would like to try to return return to urgent care in the next 2 to 3 days for repeat ketorolac  injection, you are welcome to do so.   I also recommend that you remain out of work for the next several days, I provided you with a note to return to work in 3 days.  If you feel that you need this time extended, please follow-up with your primary care provider or return to urgent care for reevaluation so that we can provide you with a note for another 3 days.   Thank you for visiting urgent care today.  We appreciate the opportunity to participate in your care.

## 2021-11-25 NOTE — ED Provider Notes (Signed)
[place]    CSN: [id number] Arrival date & time: 11/25/21  1245    HISTORY   Chief Complaint  Patient presents with   Back Pain   HPI Luis Lopez is a pleasant, 46 y.o. male who presents to urgent care today. Patient states he fell asleep in a chair last night and now has lower back pain.  Patient reports a history of sciatic nerve pain.  Patient states he also has occasional pain around his left hip when he walks.  EMR reviewed, last MRI was performed in March 2021 which revealed disc desiccation, disc height loss as well as protrusion at L5-S1.  Patient states that this time he has not tried anything to relieve his pain.  Patient states has been prescribed meloxicam in the past which has been unhelpful.  Patient states he is never been tried on steroids.  Patient states has been on muscle relaxers before but does not recall which ones, also states he did not think they helped much.  The history is provided by the patient.    Past Medical History:  Diagnosis Date   Diabetes mellitus without complication ([place])    Hypertension    borderline   PVC (premature ventricular contraction) 04/29/2018   Sleep apnea    uses a cpap   Snoring    Patient Active Problem List   Diagnosis Date Noted   Adhesive capsulitis of right shoulder 12/02/2020   Chronic right shoulder pain 12/02/2020   Nontraumatic tear of supraspinatus tendon, right 10/29/2020   Degenerative superior labral anterior-to-posterior (SLAP) tear of right shoulder 10/29/2020   Arthrosis of right acromioclavicular joint 10/29/2020   Muscle spasm 07/26/2018   Acute bilateral low back pain without sciatica 07/26/2018   PVC (premature ventricular contraction) 04/29/2018   Abnormal EKG 03/01/2018   Essential hypertension 47/42/5956   Umbilical hernia 38/75/6433   Morbid obesity due to excess calories ([place]) 11/06/2015   Nocturia more than twice per night 11/06/2015   Sleep deprivation 11/06/2015   Hypersomnia  with sleep apnea 11/06/2015   OSA (obstructive sleep apnea) 11/06/2015   Past Surgical History:  Procedure Laterality Date   INSERTION OF MESH N/A 11/03/2017   Procedure: INSERTION OF MESH;  Surgeon: Erroll Luna, MD;  Location: [facility];  Service: General;  Laterality: N/A;   SHOULDER ACROMIOPLASTY Right 10/29/2020   Procedure: RIGHT SHOULDER ARTHROSCOPY, EXTENSIVE DEBRIDEMENT, DISTAL CLAVICLE EXCISION, SUBACROMIAL DECOMPRESSION, BICEPS TENODESIS;  Surgeon: Leandrew Koyanagi, MD;  Location: [facility];  Service: Orthopedics;  Laterality: Right;   UMBILICAL HERNIA REPAIR  29/51/8841   UMBILICAL HERNIA REPAIR N/A 11/03/2017   Procedure: UMBILICAL HERNIA REPAIR WITH MESH;  Surgeon: Erroll Luna, MD;  Location: [facility];  Service: General;  Laterality: N/A;   WRIST SURGERY Left     Home Medications    Prior to Admission medications   Medication Sig Start Date End Date Taking? Authorizing Provider  aspirin EC 81 MG tablet Take 81 mg by mouth daily.   Yes [provider]  atorvastatin (LIPITOR) 20 MG tablet Take 1 tablet (20 mg total) by mouth daily. 05/06/21 05/06/22  [name], FNP  Glucagon (GVOKE HYPOPEN 2-PACK) 0.5 MG/0.1ML SOAJ Inject 0.5 mg into the skin as needed. 09/03/20   [name], FNP  hydrochlorothiazide (HYDRODIURIL) 25 MG tablet TAKE 1 TABLET(25 MG) BY MOUTH DAILY 06/12/21   [name], FNP  ibuprofen (ADVIL) 800 MG tablet Take 1 tablet (800 mg total) by mouth 3 (three) times daily. 08/23/20  Hazel Sams, PA-C  losartan (COZAAR) 25 MG tablet TAKE 1 TABLET(25 MG) BY MOUTH DAILY 06/08/21   [name], FNP  metFORMIN (GLUCOPHAGE) 500 MG tablet TAKE 1 TABLET(500 MG) BY MOUTH TWICE DAILY WITH A MEAL 05/07/21   [name], FNP  ondansetron (ZOFRAN) 4 MG tablet Take 1-2 tablets (4-8 mg total) by mouth every 8 (eight) hours as needed for nausea or vomiting. 10/29/20   Leandrew Koyanagi, MD  Semaglutide, 1 MG/DOSE, (OZEMPIC, 1 MG/DOSE,) 4 MG/3ML SOPN Inject 1  mg into the skin once a week. 08/31/21   [name], FNP  UNABLE TO FIND CPAP: At bedtime    [provider]    Family History Family History  Problem Relation Age of Onset   Diabetes Father    Diabetes Brother    Hypertension Brother    Diabetes Sister    Hypertension Sister    Aneurysm Paternal Grandmother        brain   Social History Social History   Tobacco Use   Smoking status: Former    Types: Cigarettes    Quit date: 03/23/2003    Years since quitting: 18.6   Smokeless tobacco: Never  Vaping Use   Vaping Use: Never used  Substance Use Topics   Alcohol use: No   Drug use: No   Allergies   Patient has no known allergies.  Review of Systems Review of Systems Pertinent findings revealed after performing a 14 point review of systems has been noted in the history of present illness.  Physical Exam Triage Vital Signs ED Triage Vitals  Enc Vitals Group     BP 01/16/21 0827 (!) 147/82     Pulse Rate 01/16/21 0827 72     Resp 01/16/21 0827 18     Temp 01/16/21 0827 98.3 F (36.8 C)     Temp Source 01/16/21 0827 Oral     SpO2 01/16/21 0827 98 %     Weight --      Height --      Head Circumference --      Peak Flow --      Pain Score 01/16/21 0826 5     Pain Loc --      Pain Edu? --      Excl. in [place]? --    Updated Vital Signs BP 123/79 (BP Location: Right Arm)   Pulse 77   Temp 97.6 F (36.4 C) (Oral)   Resp 18   SpO2 96%   Physical Exam Vitals and nursing note reviewed.  Constitutional:      General: He is not in acute distress.    Appearance: Normal appearance. He is normal weight. He is not ill-appearing.  HENT:     Head: Normocephalic and atraumatic.  Eyes:     Extraocular Movements: Extraocular movements intact.     Conjunctiva/sclera: Conjunctivae normal.     Pupils: Pupils are equal, round, and reactive to light.  Cardiovascular:     Rate and Rhythm: Normal rate and regular rhythm.  Pulmonary:     Effort: Pulmonary effort is  normal.     Breath sounds: Normal breath sounds.  Musculoskeletal:     Cervical back: Normal range of motion and neck supple.     Thoracic back: Normal.     Lumbar back: Spasms, tenderness and bony tenderness present. No swelling, edema, deformity, signs of trauma or lacerations. Decreased range of motion. Positive right straight leg raise test and positive left straight leg raise  test. No scoliosis.     Right hip: Normal.     Left hip: Normal.     Right knee: Normal.     Left knee: Normal.  Skin:    General: Skin is warm and dry.  Neurological:     General: No focal deficit present.     Mental Status: He is alert and oriented to person, place, and time. Mental status is at baseline.  Psychiatric:        Mood and Affect: Mood normal.        Behavior: Behavior normal.        Thought Content: Thought content normal.        Judgment: Judgment normal.     UC Couse / Diagnostics / Procedures:     Radiology DG Lumbar Spine Complete  Result Date: 11/25/2021 CLINICAL DATA:  46 year old male with L5-S1 disc protrusion. EXAM: LUMBAR SPINE - COMPLETE 4+ VIEW COMPARISON:  02/12/2019 FINDINGS: There is no evidence of lumbar spine fracture. Alignment is normal. Moderate disc space height loss at L5-S1, slightly advanced from comparison. Intervertebral disc spaces are otherwise maintained. IMPRESSION: Moderate discogenic degenerative change at L5-S1. Electronically Signed   By: Ruthann Cancer M.D.   On: 11/25/2021 15:25    Procedures Procedures (including critical care time) EKG  Pending results:  Labs Reviewed - No data to display  Medications Ordered in UC: Medications  ketorolac (TORADOL) 30 MG/ML injection 30 mg (30 mg Intramuscular Given 11/25/21 1505)    UC Diagnoses / Final Clinical Impressions(s)   I have reviewed the triage vital signs and the nursing notes.  Pertinent labs & imaging results that were available during my care of the patient were reviewed by me and considered in my  medical decision making (see chart for details).    Final diagnoses:  Acute bilateral low back pain without sciatica  DDD (degenerative disc disease), lumbosacral    Patient was provided with an injection during their visit today for acute pain relief.  Patient was advised to:  Begin tapering dose of methylprednisolone Take Baclofen 10 mg 3 times daily (Patient has been advised that if this makes them sleepy, they can just take this at bedtime, up to 20 mg per dose, and try breaking the tablets in half or 5 mg per dose during the day) Begin acetaminophen 1000 mg 3 times daily on a scheduled basis. Apply ice pack to affected area 4 times daily for 20 minutes each time Apply topical Voltaren gel 4 times daily as needed Avoid stretching or strengthening exercises until pain is completely resolved Return to urgent care in the next 2 to 3 days for repeat ketorolac injection if needed Consider physical therapy, chiropractic care, orthopedic follow-up  ED Prescriptions     Medication Sig Dispense Auth. Provider   baclofen (LIORESAL) 10 MG tablet Take 1 tablet (10 mg total) by mouth 3 (three) times daily for 7 days. 21 tablet Lynden Oxford Scales, PA-C   diclofenac Sodium (VOLTAREN) 1 % GEL Apply 4 g topically 4 (four) times daily. Apply to affected areas 4 times daily as needed for pain. 100 g Lynden Oxford Scales, PA-C   acetaminophen (TYLENOL) 500 MG tablet Take 2 tablets (1,000 mg total) by mouth every 8 (eight) hours. 180 tablet Lynden Oxford Scales, PA-C   methylPREDNISolone (MEDROL) 4 MG tablet Take 4 tablets (16 mg total) by mouth 2 (two) times daily for 3 days, THEN 3 tablets (12 mg total) daily for 3 days, THEN 2 tablets (8  mg total) daily for 3 days, THEN 1 tablet (4 mg total) daily for 3 days. 42 tablet Lynden Oxford Scales, PA-C      PDMP not reviewed this encounter.  Discharge Instructions:   Discharge Instructions      The x-ray of your lumbar spine did reveal  slight worsening of your known disc disease at L5-S1.  The space between the 2 bones has gotten a little smaller.  I have printed your x-ray report for your records.    Because of this finding, I do think is important that you reach out to your primary care provider to discuss referral to an orthopedic back specialist in the area.  This is not necessarily because you want to have surgery but more so to see if there are some interventional treatments that can prolong the need for surgery by providing you with meaningful pain relief and preserved function.  The mainstay of therapy for musculoskeletal pain is reduction of inflammation and relaxation of tension which is causing inflammation.  Keep in mind, pain always begets more pain.  To help you stay ahead of your pain and inflammation, I have provided the following regimen for you:   During your visit today, you received an injection of ketorolac, high-dose nonsteroidal anti-inflammatory pain medication that should significantly reduce your pain for the next 6 to 8 hours.    Please begin taking Tylenol 1000 mg 3 times daily (every 8 hours) as soon as you pick up your prescriptions from the pharmacy.   This evening, you can begin taking baclofen 10 mg.  This is a highly effective muscle relaxer and antispasmodic which should continue to provide you with relaxation of your tense muscles, allow you to sleep well and to keep your pain under control.  You can continue taking this medication 3 times daily as you need to.  If you find that this medication makes you too sleepy, you can break them in half for your daytime doses and, if needed double them for your nighttime dose.  Do not take more than 30 mg of baclofen in a 24-hour period.   Tomorrow morning, please begin taking methylprednisolone.  Please take all tablets of the daily recommended dose with your breakfast meal.  If you have had significant relation of your pain before you finish the entire  prescription, please feel free to discontinue.  It is not important to finish every dose.   During the day, please set aside time to apply ice to the affected area 4 times daily for 20 minutes each application.  This can be achieved by using a bag of frozen peas or corn, a Ziploc bag filled with ice and water, or Ziploc bag filled with half rubbing alcohol and half Dawn dish detergent, frozen into a slush.  Please be careful not to apply ice directly to your skin, always place a soft cloth between you and the ice pack.   You are welcome to use topical anti-inflammatory creams such as Voltaren gel, capsaicin or Aspercreme as recommended.  These medications are available over-the-counter, please follow manufactures instructions for use.  As a courtesy, I provided you with a prescription for diclofenac in the event that your insurance will pay for this.   Please consider discussing referral to physical therapy with your primary care provider.  Physical therapist are very good at teasing out the underlying cause of acute lower back pain and helping with prevention of future recurrences.   Please avoid attempts to stretch or  strengthen the affected area until you are feeling completely pain-free.  Attempts to do so will only prolong the healing process.   If you would like to try to return return to urgent care in the next 2 to 3 days for repeat ketorolac injection, you are welcome to do so.   I also recommend that you remain out of work for the next several days, I provided you with a note to return to work in 3 days.  If you feel that you need this time extended, please follow-up with your primary care provider or return to urgent care for reevaluation so that we can provide you with a note for another 3 days.   Thank you for visiting urgent care today.  We appreciate the opportunity to participate in your care.          Disposition Upon Discharge:  Condition: stable for discharge home Home:  take medications as prescribed; routine discharge instructions as discussed; follow up as advised.  Patient presented with an acute illness with associated systemic symptoms and significant discomfort requiring urgent management. In my opinion, this is a condition that a prudent lay person (someone who possesses an average knowledge of health and medicine) may potentially expect to result in complications if not addressed urgently such as respiratory distress, impairment of bodily function or dysfunction of bodily organs.   Routine symptom specific, illness specific and/or disease specific instructions were discussed with the patient and/or caregiver at length.   As such, the patient has been evaluated and assessed, work-up was performed and treatment was provided in alignment with urgent care protocols and evidence based medicine.  Patient/parent/caregiver has been advised that the patient may require follow up for further testing and treatment if the symptoms continue in spite of treatment, as clinically indicated and appropriate.  Patient/parent/caregiver has been advised to report to orthopedic urgent care clinic or return to the [facility] or PCP in 3-5 days if no better; follow-up with orthopedics, PCP or the Emergency Department if new signs and symptoms develop or if the current signs or symptoms continue to change or worsen for further workup, evaluation and treatment as clinically indicated and appropriate  The patient will follow up with their current PCP if and as advised. If the patient does not currently have a PCP we will have assisted them in obtaining one.   The patient may need specialty follow up if the symptoms continue, in spite of conservative treatment and management, for further workup, evaluation, consultation and treatment as clinically indicated and appropriate.  Patient/parent/caregiver verbalized understanding and agreement of plan as discussed.  All questions were addressed  during visit.  Please see discharge instructions below for further details of plan.  This office note has been dictated using Museum/gallery curator.  Unfortunately, this method of dictation can sometimes lead to typographical or grammatical errors.  I apologize for your inconvenience in advance if this occurs.  Please do not hesitate to reach out to me if clarification is needed.      Lynden Oxford Scales, PA-C 11/25/21 1536

## 2021-12-14 ENCOUNTER — Other Ambulatory Visit: Payer: Self-pay

## 2021-12-14 ENCOUNTER — Encounter: Payer: Self-pay | Admitting: Nurse Practitioner

## 2021-12-14 ENCOUNTER — Ambulatory Visit (INDEPENDENT_AMBULATORY_CARE_PROVIDER_SITE_OTHER): Payer: 59 | Admitting: Nurse Practitioner

## 2021-12-14 VITALS — BP 136/70 | HR 96 | Temp 97.9°F | Ht 75.0 in | Wt 340.4 lb

## 2021-12-14 DIAGNOSIS — E669 Obesity, unspecified: Secondary | ICD-10-CM | POA: Diagnosis not present

## 2021-12-14 DIAGNOSIS — I10 Essential (primary) hypertension: Secondary | ICD-10-CM | POA: Diagnosis not present

## 2021-12-14 DIAGNOSIS — Z6841 Body Mass Index (BMI) 40.0 and over, adult: Secondary | ICD-10-CM | POA: Diagnosis not present

## 2021-12-14 DIAGNOSIS — E1169 Type 2 diabetes mellitus with other specified complication: Secondary | ICD-10-CM

## 2021-12-14 MED ORDER — HYDROCHLOROTHIAZIDE 25 MG PO TABS: ORAL_TABLET | ORAL | 1 refills | Status: DC

## 2021-12-14 MED ORDER — METFORMIN HCL 500 MG PO TABS: ORAL_TABLET | ORAL | 2 refills | Status: DC

## 2021-12-14 MED ORDER — LOSARTAN POTASSIUM 25 MG PO TABS: ORAL_TABLET | ORAL | 1 refills | Status: DC

## 2021-12-14 NOTE — Progress Notes (Unsigned)
Barnet Glasgow Martin,acting as a Education administrator for [name], FNP.,have documented all relevant documentation on the behalf of [name], FNP,as directed by  [name], FNP while in the presence of [name], [place].    Subjective:     Patient ID: Luis Lopez , male    DOB: Aug 21, 1975 , 46 y.o.   MRN: [id number]   Chief Complaint  Patient presents with   Hypertension    HPI  Patient presents today for a bp check.  BP Readings from Last 3 Encounters: 12/14/21 : 136/70 11/25/21 : 123/79 05/06/21 : 128/72  Wt Readings from Last 3 Encounters: 12/14/21 : (!) 340 lb 6.4 oz (154.4 kg) 08/14/21 : (!) 320 lb (145.2 kg) 05/06/21 : (!) 345 lb 9.6 oz (156.8 kg)  He was out of work for a while due to his left hand surgery. Returned to work 2 months ago.    Hypertension This is a chronic problem. The current episode started more than 1 year ago. The problem is controlled. Pertinent negatives include no anxiety.     Past Medical History:  Diagnosis Date   Diabetes mellitus without complication ([place])    Hypertension    borderline   PVC (premature ventricular contraction) 04/29/2018   Sleep apnea    uses a cpap   Snoring      Family History  Problem Relation Age of Onset   Diabetes Father    Diabetes Brother    Hypertension Brother    Diabetes Sister    Hypertension Sister    Aneurysm Paternal Grandmother        brain     Current Outpatient Medications:    acetaminophen (TYLENOL) 500 MG tablet, Take 2 tablets (1,000 mg total) by mouth every 8 (eight) hours., Disp: 180 tablet, Rfl: 0   aspirin EC 81 MG tablet, Take 81 mg by mouth daily., Disp: , Rfl:    atorvastatin (LIPITOR) 20 MG tablet, Take 1 tablet (20 mg total) by mouth daily., Disp: 30 tablet, Rfl: 2   diclofenac Sodium (VOLTAREN) 1 % GEL, Apply 4 g topically 4 (four) times daily. Apply to affected areas 4 times daily as needed for pain., Disp: 100 g, Rfl: 2   Glucagon (GVOKE HYPOPEN 2-PACK) 0.5 MG/0.1ML SOAJ, Inject  0.5 mg into the skin as needed., Disp: 0.2 mL, Rfl: 2   hydrochlorothiazide (HYDRODIURIL) 25 MG tablet, TAKE 1 TABLET(25 MG) BY MOUTH DAILY, Disp: 90 tablet, Rfl: 1   ibuprofen (ADVIL) 800 MG tablet, Take 1 tablet (800 mg total) by mouth 3 (three) times daily., Disp: 21 tablet, Rfl: 0   losartan (COZAAR) 25 MG tablet, As directed, Disp: 90 tablet, Rfl: 1   metFORMIN (GLUCOPHAGE) 500 MG tablet, TAKE 1 TABLET(500 MG) BY MOUTH TWICE DAILY WITH A MEAL, Disp: 60 tablet, Rfl: 2   ondansetron (ZOFRAN) 4 MG tablet, Take 1-2 tablets (4-8 mg total) by mouth every 8 (eight) hours as needed for nausea or vomiting., Disp: 20 tablet, Rfl: 0   Semaglutide, 1 MG/DOSE, (OZEMPIC, 1 MG/DOSE,) 4 MG/3ML SOPN, Inject 1 mg into the skin once a week., Disp: 3 mL, Rfl: 2   UNABLE TO FIND, CPAP: At bedtime, Disp: , Rfl:    No Known Allergies   Review of Systems  Constitutional: Negative.   HENT: Negative.    Eyes: Negative.   Respiratory: Negative.    Cardiovascular: Negative.   Gastrointestinal: Negative.      Today's Vitals   12/14/21 1107  BP: 136/70  Pulse: 96  Temp: 97.9 F (36.6 C)  TempSrc: Oral  Weight: (!) 340 lb 6.4 oz (154.4 kg)  Height: 6' 3"  (1.905 m)  PainSc: 0-No pain   Body mass index is 42.55 kg/m.   Objective:  Physical Exam Vitals reviewed.  Constitutional:      General: He is not in acute distress.    Appearance: Normal appearance. He is obese.  Cardiovascular:     Rate and Rhythm: Normal rate. Rhythm irregular.     Pulses: Normal pulses.     Heart sounds: Normal heart sounds. No murmur heard. Pulmonary:     Effort: Pulmonary effort is normal. No respiratory distress.     Breath sounds: Normal breath sounds. No wheezing.  Skin:    General: Skin is warm and dry.     Capillary Refill: Capillary refill takes less than 2 seconds.  Neurological:     General: No focal deficit present.     Mental Status: He is alert and oriented to person, place, and time.     Cranial Nerves:  No cranial nerve deficit.     Motor: No weakness.  Psychiatric:        Mood and Affect: Mood normal.        Behavior: Behavior normal.        Thought Content: Thought content normal.        Judgment: Judgment normal.         Assessment And Plan:     1. Essential hypertension Blood pressure is stable, continue current medications - BMP8+eGFR  2. Diabetes mellitus type 2 in obese [facility]) Comments: Has not been since January and not on Ozempic for several months due to being out of work for surgery. Will restart with a sample. Advised to call office in future - POCT Urinalysis Dipstick (81002) - Microalbumin / Creatinine Urine Ratio - Hemoglobin A1c  3. Morbid obesity due to excess calories ([place]) I have encouraged him to increase his physical activity and focus on a healthy diet   Patient was given opportunity to ask questions. Patient verbalized understanding of the plan and was able to repeat key elements of the plan. All questions were answered to their satisfaction.  [name], FNP   I, [name], FNP, have reviewed all documentation for this visit. The documentation on 12/14/21 for the exam, diagnosis, procedures, and orders are all accurate and complete.   IF YOU HAVE BEEN REFERRED TO A SPECIALIST, IT MAY TAKE 1-2 WEEKS TO SCHEDULE/PROCESS THE REFERRAL. IF YOU HAVE NOT HEARD FROM US/SPECIALIST IN TWO WEEKS, PLEASE GIVE [place] A CALL AT [phone].   THE PATIENT IS ENCOURAGED TO PRACTICE SOCIAL DISTANCING DUE TO THE COVID-19 PANDEMIC.

## 2021-12-14 NOTE — Patient Instructions (Signed)
Hypertension, Adult ?Hypertension is another name for high blood pressure. High blood pressure forces your heart to work harder to pump blood. This can cause problems over time. ?There are two numbers in a blood pressure reading. There is a top number (systolic) over a bottom number (diastolic). It is best to have a blood pressure that is below 120/80. ?What are the causes? ?The cause of this condition is not known. Some other conditions can lead to high blood pressure. ?What increases the risk? ?Some lifestyle factors can make you more likely to develop high blood pressure: ?Smoking. ?Not getting enough exercise or physical activity. ?Being overweight. ?Having too much fat, sugar, calories, or salt (sodium) in your diet. ?Drinking too much alcohol. ?Other risk factors include: ?Having any of these conditions: ?Heart disease. ?Diabetes. ?High cholesterol. ?Kidney disease. ?Obstructive sleep apnea. ?Having a family history of high blood pressure and high cholesterol. ?Age. The risk increases with age. ?Stress. ?What are the signs or symptoms? ?High blood pressure may not cause symptoms. Very high blood pressure (hypertensive crisis) may cause: ?Headache. ?Fast or uneven heartbeats (palpitations). ?Shortness of breath. ?Nosebleed. ?Vomiting or feeling like you may vomit (nauseous). ?Changes in how you see. ?Very bad chest pain. ?Feeling dizzy. ?Seizures. ?How is this treated? ?This condition is treated by making healthy lifestyle changes, such as: ?Eating healthy foods. ?Exercising more. ?Drinking less alcohol. ?Your doctor may prescribe medicine if lifestyle changes do not help enough and if: ?Your top number is above 130. ?Your bottom number is above 80. ?Your personal target blood pressure may vary. ?Follow these instructions at home: ?Eating and drinking ? ?If told, follow the DASH eating plan. To follow this plan: ?Fill one half of your plate at each meal with fruits and vegetables. ?Fill one fourth of your plate  at each meal with whole grains. Whole grains include whole-wheat pasta, brown rice, and whole-grain bread. ?Eat or drink low-fat dairy products, such as skim milk or low-fat yogurt. ?Fill one fourth of your plate at each meal with low-fat (lean) proteins. Low-fat proteins include fish, chicken without skin, eggs, beans, and tofu. ?Avoid fatty meat, cured and processed meat, or chicken with skin. ?Avoid pre-made or processed food. ?Limit the amount of salt in your diet to less than 1,500 mg each day. ?Do not drink alcohol if: ?Your doctor tells you not to drink. ?You are pregnant, may be pregnant, or are planning to become pregnant. ?If you drink alcohol: ?Limit how much you have to: ?0-1 drink a day for women. ?0-2 drinks a day for men. ?Know how much alcohol is in your drink. In the [place] one drink equals one 12 oz bottle of beer (355 mL), one 5 oz glass of wine (148 mL), or one 1? oz glass of hard liquor (44 mL). ?Lifestyle ? ?Work with your doctor to stay at a healthy weight or to lose weight. Ask your doctor what the best weight is for you. ?Get at least 30 minutes of exercise that causes your heart to beat faster (aerobic exercise) most days of the week. This may include walking, swimming, or biking. ?Get at least 30 minutes of exercise that strengthens your muscles (resistance exercise) at least 3 days a week. This may include lifting weights or doing Pilates. ?Do not smoke or use any products that contain nicotine or tobacco. If you need help quitting, ask your doctor. ?Check your blood pressure at home as told by your doctor. ?Keep all follow-up visits. ?Medicines ?Take over-the-counter and prescription medicines   only as told by your doctor. Follow directions carefully. ?Do not skip doses of blood pressure medicine. The medicine does not work as well if you skip doses. Skipping doses also puts you at risk for problems. ?Ask your doctor about side effects or reactions to medicines that you should watch  for. ?Contact a doctor if: ?You think you are having a reaction to the medicine you are taking. ?You have headaches that keep coming back. ?You feel dizzy. ?You have swelling in your ankles. ?You have trouble with your vision. ?Get help right away if: ?You get a very bad headache. ?You start to feel mixed up (confused). ?You feel weak or numb. ?You feel faint. ?You have very bad pain in your: ?Chest. ?Belly (abdomen). ?You vomit more than once. ?You have trouble breathing. ?These symptoms may be an emergency. Get help right away. Call 911. ?Do not wait to see if the symptoms will go away. ?Do not drive yourself to the hospital. ?Summary ?Hypertension is another name for high blood pressure. ?High blood pressure forces your heart to work harder to pump blood. ?For most people, a normal blood pressure is less than 120/80. ?Making healthy choices can help lower blood pressure. If your blood pressure does not get lower with healthy choices, you may need to take medicine. ?This information is not intended to replace advice given to you by your health care provider. Make sure you discuss any questions you have with your health care provider. ?Document Revised: 12/25/2020 Document Reviewed: 12/25/2020 ?Elsevier Patient Education ? 2023 Else[employer] Inc[employer] ? ?

## 2021-12-15 LAB — BMP8+EGFR
BUN/Creatinine Ratio: 16 (ref 9–20)
BUN: 14 mg/dL (ref 6–24)
CO2: 22 mmol/L (ref 20–29)
Calcium: 9.5 mg/dL (ref 8.7–10.2)
Chloride: 103 mmol/L (ref 96–106)
Creatinine, Ser: 0.87 mg/dL (ref 0.76–1.27)
Glucose: 132 mg/dL — ABNORMAL HIGH (ref 70–99)
Potassium: 4 mmol/L (ref 3.5–5.2)
Sodium: 143 mmol/L (ref 134–144)
eGFR: 109 mL/min/{1.73_m2} (ref >59)

## 2021-12-15 LAB — HEMOGLOBIN A1C
Est. average glucose Bld gHb Est-mCnc: 140 mg/dL
Hgb A1c MFr Bld: 6.5 % — ABNORMAL HIGH (ref 4.8–5.6)

## 2021-12-15 LAB — MICROALBUMIN / CREATININE URINE RATIO
Creatinine, Urine: 139 mg/dL
Microalb/Creat Ratio: 26 mg/g creat (ref 0–29)
Microalbumin, Urine: 36 ug/mL

## 2022-02-08 ENCOUNTER — Ambulatory Visit: Payer: 59 | Admitting: Family Medicine

## 2022-02-26 ENCOUNTER — Other Ambulatory Visit: Payer: Self-pay | Admitting: Nurse Practitioner

## 2022-03-01 ENCOUNTER — Other Ambulatory Visit (HOSPITAL_COMMUNITY): Payer: Self-pay

## 2022-03-08 ENCOUNTER — Other Ambulatory Visit: Payer: Self-pay

## 2022-03-08 ENCOUNTER — Encounter (HOSPITAL_COMMUNITY): Payer: Self-pay | Admitting: *Deleted

## 2022-03-08 ENCOUNTER — Ambulatory Visit (HOSPITAL_COMMUNITY)
Admission: EM | Admit: 2022-03-08 | Discharge: 2022-03-08 | Disposition: A | Discharge status: Home / Self Care | Payer: 59 | Attending: Internal Medicine | Admitting: Internal Medicine

## 2022-03-08 DIAGNOSIS — E119 Type 2 diabetes mellitus without complications: Secondary | ICD-10-CM | POA: Diagnosis not present

## 2022-03-08 DIAGNOSIS — J069 Acute upper respiratory infection, unspecified: Secondary | ICD-10-CM

## 2022-03-08 DIAGNOSIS — Z7985 Long-term (current) use of injectable non-insulin antidiabetic drugs: Secondary | ICD-10-CM | POA: Diagnosis not present

## 2022-03-08 DIAGNOSIS — J029 Acute pharyngitis, unspecified: Secondary | ICD-10-CM | POA: Diagnosis not present

## 2022-03-08 DIAGNOSIS — Z1152 Encounter for screening for COVID-19: Secondary | ICD-10-CM | POA: Insufficient documentation

## 2022-03-08 DIAGNOSIS — Z87891 Personal history of nicotine dependence: Secondary | ICD-10-CM | POA: Diagnosis not present

## 2022-03-08 DIAGNOSIS — Z7984 Long term (current) use of oral hypoglycemic drugs: Secondary | ICD-10-CM | POA: Diagnosis not present

## 2022-03-08 DIAGNOSIS — R058 Other specified cough: Secondary | ICD-10-CM | POA: Diagnosis not present

## 2022-03-08 DIAGNOSIS — I10 Essential (primary) hypertension: Secondary | ICD-10-CM | POA: Diagnosis not present

## 2022-03-08 DIAGNOSIS — Z79899 Other long term (current) drug therapy: Secondary | ICD-10-CM | POA: Diagnosis not present

## 2022-03-08 DIAGNOSIS — I493 Ventricular premature depolarization: Secondary | ICD-10-CM | POA: Diagnosis not present

## 2022-03-08 NOTE — ED Triage Notes (Addendum)
Pt reports a cough , fatigue loss of smell and taste all started 2 days ago. Pt reports multiple family members have been sick. 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

## 2022-03-08 NOTE — Discharge Instructions (Addendum)
Please maintain adequate hydration Humidifier and VapoRub use will help with nasal congestion. Tylenol or Motrin as needed for pain and/or fever Will call you with recommendations if labs are abnormal Return to urgent care if symptoms worsen.

## 2022-03-08 NOTE — ED Provider Notes (Signed)
[place]    CSN: [id number] Arrival date & time: 03/08/22  1038      History   Chief Complaint Chief Complaint  Patient presents with   Cough   loss of smell Luis Lopez   Fatigue    HPI Luis Lopez is a 46 y.o. male comes to the urgent care with complaints of loss of taste and smell as well as generalized fatigue and cough over the past couple of days.  He has a cough which is nonproductive.  No shortness of breath or wheezing.  No chest pain or chest pressure.  Patient denies any generalized bodyaches.  Other family members have upper respiratory infection symptoms. HPI  Past Medical History:  Diagnosis Date   Diabetes mellitus without complication ([place])    Hypertension    borderline   PVC (premature ventricular contraction) 04/29/2018   Sleep apnea    uses a cpap   Snoring     Patient Active Problem List   Diagnosis Date Noted   Adhesive capsulitis of right shoulder 12/02/2020   Chronic right shoulder pain 12/02/2020   Nontraumatic tear of supraspinatus tendon, right 10/29/2020   Degenerative superior labral anterior-to-posterior (SLAP) tear of right shoulder 10/29/2020   Arthrosis of right acromioclavicular joint 10/29/2020   Muscle spasm 07/26/2018   Acute bilateral low back pain without sciatica 07/26/2018   PVC (premature ventricular contraction) 04/29/2018   Abnormal EKG 03/01/2018   Essential hypertension 62/13/0865   Umbilical hernia 78/46/9629   Morbid obesity due to excess calories ([place]) 11/06/2015   Nocturia more than twice per night 11/06/2015   Sleep deprivation 11/06/2015   Hypersomnia with sleep apnea 11/06/2015   OSA (obstructive sleep apnea) 11/06/2015    Past Surgical History:  Procedure Laterality Date   INSERTION OF MESH N/A 11/03/2017   Procedure: INSERTION OF MESH;  Surgeon: Erroll Luna, MD;  Location: [facility];  Service: General;  Laterality: N/A;   SHOULDER ACROMIOPLASTY Right 10/29/2020   Procedure: RIGHT SHOULDER  ARTHROSCOPY, EXTENSIVE DEBRIDEMENT, DISTAL CLAVICLE EXCISION, SUBACROMIAL DECOMPRESSION, BICEPS TENODESIS;  Surgeon: Leandrew Koyanagi, MD;  Location: [facility];  Service: Orthopedics;  Laterality: Right;   UMBILICAL HERNIA REPAIR  52/84/1324   UMBILICAL HERNIA REPAIR N/A 11/03/2017   Procedure: UMBILICAL HERNIA REPAIR WITH MESH;  Surgeon: Erroll Luna, MD;  Location: [facility];  Service: General;  Laterality: N/A;   WRIST SURGERY Left        Home Medications    Prior to Admission medications   Medication Sig Start Date End Date Taking? Authorizing Provider  aspirin EC 81 MG tablet Take 81 mg by mouth daily.    [provider]  atorvastatin (LIPITOR) 20 MG tablet Take 1 tablet (20 mg total) by mouth daily. 05/06/21 05/06/22  [name], FNP  diclofenac Sodium (VOLTAREN) 1 % GEL Apply 4 g topically 4 (four) times daily. Apply to affected areas 4 times daily as needed for pain. 11/25/21   Lynden Oxford Scales, PA-C  Glucagon (GVOKE HYPOPEN 2-PACK) 0.5 MG/0.1ML SOAJ Inject 0.5 mg into the skin as needed. 09/03/20   [name], FNP  hydrochlorothiazide (HYDRODIURIL) 25 MG tablet TAKE 1 TABLET(25 MG) BY MOUTH DAILY 12/14/21   [name], FNP  ibuprofen (ADVIL) 800 MG tablet Take 1 tablet (800 mg total) by mouth 3 (three) times daily. 08/23/20   Hazel Sams, PA-C  losartan (COZAAR) 25 MG tablet As directed 12/14/21   [name], FNP  metFORMIN (GLUCOPHAGE) 500 MG tablet TAKE 1 TABLET(500  MG) BY MOUTH TWICE DAILY WITH A MEAL 02/26/22   [name], FNP  ondansetron (ZOFRAN) 4 MG tablet Take 1-2 tablets (4-8 mg total) by mouth every 8 (eight) hours as needed for nausea or vomiting. 10/29/20   Leandrew Koyanagi, MD  Semaglutide, 1 MG/DOSE, (OZEMPIC, 1 MG/DOSE,) 4 MG/3ML SOPN Inject 1 mg into the skin once a week. 08/31/21   [name], FNP  UNABLE TO FIND CPAP: At bedtime    [provider]    Family History Family History  Problem Relation Age of Onset    Diabetes Father    Diabetes Brother    Hypertension Brother    Diabetes Sister    Hypertension Sister    Aneurysm Paternal Grandmother        brain    Social History Social History   Tobacco Use   Smoking status: Former    Types: Cigarettes    Quit date: 03/23/2003    Years since quitting: 18.9   Smokeless tobacco: Never  Vaping Use   Vaping Use: Never used  Substance Use Topics   Alcohol use: No   Drug use: No     Allergies   Patient has no known allergies.   Review of Systems Review of Systems  Constitutional: Negative.   HENT:  Positive for congestion and sore throat. Negative for voice change.   Respiratory:  Positive for cough.   Neurological:  Positive for headaches.     Physical Exam Triage Vital Signs ED Triage Vitals  Enc Vitals Group     BP 03/08/22 1447 (!) 153/95     Pulse Rate 03/08/22 1447 80     Resp 03/08/22 1447 20     Temp 03/08/22 1447 97.6 F (36.4 C)     Temp src --      SpO2 03/08/22 1447 93 %     Weight --      Height --      Head Circumference --      Peak Flow --      Pain Score 03/08/22 1445 0     Pain Loc --      Pain Edu? --      Excl. in [place]? --    No data found.  Updated Vital Signs BP (!) 153/95   Pulse 80   Temp 97.6 F (36.4 C)   Resp 20   SpO2 93%   Visual Acuity Right Eye Distance:   Left Eye Distance:   Bilateral Distance:    Right Eye Near:   Left Eye Near:    Bilateral Near:     Physical Exam Vitals and nursing note reviewed.  Constitutional:      General: He is not in acute distress.    Appearance: He is not ill-appearing.  HENT:     Right Ear: Tympanic membrane normal.     Left Ear: Tympanic membrane normal.     Nose: Congestion present. No rhinorrhea.     Mouth/Throat:     Mouth: Mucous membranes are moist.     Pharynx: Posterior oropharyngeal erythema present.  Cardiovascular:     Rate and Rhythm: Normal rate and regular rhythm.  Pulmonary:     Effort: Pulmonary effort is normal.      Breath sounds: Normal breath sounds.  Musculoskeletal:        General: Normal range of motion.  Neurological:     Mental Status: He is alert.      UC Treatments / Results  Labs (all labs ordered are listed, but only abnormal results are displayed) Labs Reviewed  SARS CORONAVIRUS 2 (TAT 6-24 HRS)    EKG   Radiology No results found.  Procedures Procedures (including critical care time)  Medications Ordered in UC Medications - No data to display  Initial Impression / Assessment and Plan / UC Course  I have reviewed the triage vital signs and the nursing notes.  Pertinent labs & imaging results that were available during my care of the patient were reviewed by me and considered in my medical decision making (see chart for details).     1.  Viral URI with cough: COVID-19 PCR test has been sent Maintain adequate hydration Tylenol/Motrin as needed for pain and/or fever Will call you with recommendations if labs are abnormal. If COVID-19 is positive, patient will benefit from antiviral therapy.  GFR is greater than 60.  Patient will benefit from Paxlovid.  Patient will be advised to hold atorvastatin for 10 days. Final Clinical Impressions(s) / UC Diagnoses   Final diagnoses:  Viral URI with cough     Discharge Instructions      Please maintain adequate hydration Humidifier and VapoRub use will help with nasal congestion. Tylenol or Motrin as needed for pain and/or fever Will call you with recommendations if labs are abnormal Return to urgent care if symptoms worsen.   ED Prescriptions   None    PDMP not reviewed this encounter.   Chase Picket, MD 03/08/22 [id number]

## 2022-03-09 ENCOUNTER — Other Ambulatory Visit: Payer: Self-pay

## 2022-03-09 LAB — SARS CORONAVIRUS 2 (TAT 6-24 HRS): SARS Coronavirus 2: NEGATIVE

## 2022-03-11 ENCOUNTER — Other Ambulatory Visit: Payer: Self-pay

## 2022-03-16 ENCOUNTER — Other Ambulatory Visit: Payer: Self-pay

## 2022-03-16 ENCOUNTER — Encounter: Payer: Self-pay | Admitting: Nurse Practitioner

## 2022-03-16 ENCOUNTER — Ambulatory Visit (HOSPITAL_COMMUNITY)
Admission: EM | Admit: 2022-03-16 | Discharge: 2022-03-16 | Disposition: A | Discharge status: Home / Self Care | Payer: 59 | Attending: Internal Medicine | Admitting: Internal Medicine

## 2022-03-16 ENCOUNTER — Encounter (HOSPITAL_COMMUNITY): Payer: Self-pay

## 2022-03-16 ENCOUNTER — Ambulatory Visit (INDEPENDENT_AMBULATORY_CARE_PROVIDER_SITE_OTHER): Payer: 59

## 2022-03-16 DIAGNOSIS — M25571 Pain in right ankle and joints of right foot: Secondary | ICD-10-CM | POA: Diagnosis not present

## 2022-03-16 DIAGNOSIS — M7989 Other specified soft tissue disorders: Secondary | ICD-10-CM | POA: Diagnosis not present

## 2022-03-16 DIAGNOSIS — M7751 Other enthesopathy of right foot: Secondary | ICD-10-CM | POA: Diagnosis not present

## 2022-03-16 MED ORDER — IBUPROFEN 800 MG PO TABS: 800.0000 mg | ORAL_TABLET | Freq: Two times a day (BID) | ORAL | 0 refills | Status: AC | PRN

## 2022-03-16 NOTE — ED Triage Notes (Signed)
Chief Complaint: right ankle pain. Patient is a standing [employer]. No falls or known injuries. Swelling, no previous injuries, no reduced ROM.   Onset: 1.5 weeks  Prescriptions or OTC medications tried: No

## 2022-03-16 NOTE — ED Provider Notes (Incomplete)
[place]    CSN: [id number] Arrival date & time: 03/16/22  1401      History   Chief Complaint Chief Complaint  Patient presents with   Ankle Pain    HPI Luis Lopez is a 46 y.o. male.  Patient presents complaining of right ankle pain that started approximately 1 week ago.  He denies any fall or trauma. He denies any numbness or tingling to RT lower extremity. He denies any color change to the skin or swelling around the affected area.  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  Patient reports increased pain with standing and putting weight on his ankle. Patient has not used any medications for symptoms.    Ankle Pain   Past Medical History:  Diagnosis Date   Diabetes mellitus without complication ([place])    Hypertension    borderline   PVC (premature ventricular contraction) 04/29/2018   Sleep apnea    uses a cpap   Snoring     Patient Active Problem List   Diagnosis Date Noted   Adhesive capsulitis of right shoulder 12/02/2020   Chronic right shoulder pain 12/02/2020   Nontraumatic tear of supraspinatus tendon, right 10/29/2020   Degenerative superior labral anterior-to-posterior (SLAP) tear of right shoulder 10/29/2020   Arthrosis of right acromioclavicular joint 10/29/2020   Muscle spasm 07/26/2018   Acute bilateral low back pain without sciatica 07/26/2018   PVC (premature ventricular contraction) 04/29/2018   Abnormal EKG 03/01/2018   Essential hypertension 48/03/6551   Umbilical hernia 74/82/7078   Morbid obesity due to excess calories ([place]) 11/06/2015   Nocturia more than twice per night 11/06/2015   Sleep deprivation 11/06/2015   Hypersomnia with sleep apnea 11/06/2015   OSA (obstructive sleep apnea) 11/06/2015    Past Surgical History:  Procedure Laterality Date   INSERTION OF MESH N/A  11/03/2017   Procedure: INSERTION OF MESH;  Surgeon: Erroll Luna, MD;  Location: [facility];  Service: General;  Laterality: N/A;   SHOULDER ACROMIOPLASTY Right 10/29/2020   Procedure: RIGHT SHOULDER ARTHROSCOPY, EXTENSIVE DEBRIDEMENT, DISTAL CLAVICLE EXCISION, SUBACROMIAL DECOMPRESSION, BICEPS TENODESIS;  Surgeon: Leandrew Koyanagi, MD;  Location: [facility];  Service: Orthopedics;  Laterality: Right;   UMBILICAL HERNIA REPAIR  67/54/4920   UMBILICAL HERNIA REPAIR N/A 11/03/2017   Procedure: UMBILICAL HERNIA REPAIR WITH MESH;  Surgeon: Erroll Luna, MD;  Location: [facility];  Service: General;  Laterality: N/A;   WRIST SURGERY Left        Home Medications    Prior to Admission medications   Medication Sig Start Date End Date Taking? Authorizing Provider  aspirin EC 81 MG tablet Take 81 mg by mouth daily.   Yes [provider]  atorvastatin (LIPITOR) 20 MG tablet Take 1 tablet (20 mg total) by mouth daily. 05/06/21 05/06/22 Yes [name], FNP  diclofenac Sodium (VOLTAREN) 1 % GEL Apply 4 g topically 4 (four) times daily. Apply to affected areas 4 times daily as needed for pain. 11/25/21  Yes Lynden Oxford Scales, PA-C  Glucagon (GVOKE HYPOPEN 2-PACK) 0.5 MG/0.1ML SOAJ Inject 0.5 mg into the skin as needed. 09/03/20  Yes [name], FNP  hydrochlorothiazide (HYDRODIURIL) 25 MG tablet TAKE 1 TABLET(25 MG) BY MOUTH DAILY 12/14/21  Yes [name], FNP  ibuprofen (ADVIL) 800 MG tablet Take  1 tablet (800 mg total) by mouth 2 (two) times daily as needed for moderate pain. 03/16/22  Yes Flossie Dibble, NP  losartan (COZAAR) 25 MG tablet As directed 12/14/21  Yes [name], FNP  metFORMIN (GLUCOPHAGE) 500 MG tablet TAKE 1 TABLET(500 MG) BY MOUTH TWICE DAILY WITH A MEAL 02/26/22  Yes [name], FNP  ondansetron (ZOFRAN) 4 MG tablet Take 1-2 tablets (4-8 mg total) by mouth every 8 (eight) hours as needed for nausea or vomiting. 10/29/20  Yes Leandrew Koyanagi, MD   Semaglutide, 1 MG/DOSE, (OZEMPIC, 1 MG/DOSE,) 4 MG/3ML SOPN Inject 1 mg into the skin once a week. 08/31/21  Yes [name], FNP  UNABLE TO FIND CPAP: At bedtime   Yes [provider]    Family History Family History  Problem Relation Age of Onset   Diabetes Father    Diabetes Brother    Hypertension Brother    Diabetes Sister    Hypertension Sister    Aneurysm Paternal Grandmother        brain    Social History Social History   Tobacco Use   Smoking status: Former    Types: Cigarettes    Quit date: 03/23/2003    Years since quitting: 18.9   Smokeless tobacco: Never  Vaping Use   Vaping Use: Never used  Substance Use Topics   Alcohol use: No   Drug use: No     Allergies   Patient has no known allergies.   Review of Systems Review of Systems Per HPI  Physical Exam Triage Vital Signs ED Triage Vitals  Enc Vitals Group     BP 03/16/22 1655 (!) 140/83     Pulse Rate 03/16/22 1655 80     Resp 03/16/22 1655 16     Temp 03/16/22 1655 98.5 F (36.9 C)     Temp Source 03/16/22 1655 Oral     SpO2 03/16/22 1655 97 %     Weight --      Height --      Head Circumference --      Peak Flow --      Pain Score 03/16/22 1654 6     Pain Loc --      Pain Edu? --      Excl. in [place]? --    No data found.  Updated Vital Signs BP (!) 140/83 (BP Location: Right Arm)   Pulse 80   Temp 98.5 F (36.9 C) (Oral)   Resp 16   SpO2 97%      Physical Exam Vitals and nursing note reviewed.  Constitutional:      Appearance: Normal appearance.  Cardiovascular:     Pulses:          Dorsalis pedis pulses are 2+ on the right side.       Posterior tibial pulses are 2+ on the right side.  Musculoskeletal:     Right ankle: No swelling, deformity, ecchymosis or lacerations. Tenderness (Anterior RT ankle) present. Normal range of motion. Anterior drawer test negative. Normal pulse.     Right Achilles Tendon: No tenderness.     Left ankle: Normal.       Legs:      Comments: Anterior RT Ankle :Reproducible pain with plantar flexion and dorsiflexion, pain is worse with dorsiflexion. Reproducible pain with eversion and inversion.No erythema, bruising, or ecchymoses of RT ankle.   Neurological:     Mental Status: He is alert.      UC Treatments /  Results  Labs (all labs ordered are listed, but only abnormal results are displayed) Labs Reviewed - No data to display  EKG   Radiology DG Ankle Complete Right  Result Date: 03/16/2022 CLINICAL DATA:  Right ankle swelling and pain EXAM: RIGHT ANKLE - COMPLETE 3+ VIEW COMPARISON:  None Available. FINDINGS: Frontal, oblique, and lateral views of the right ankle are obtained. No fracture, subluxation, or dislocation. Joint spaces are well preserved. Soft tissues are normal. IMPRESSION: 1. Unremarkable right ankle. Electronically Signed   By: Randa Ngo M.D.   On: 03/16/2022 17:41    Procedures Procedures (including critical care time)  Medications Ordered in UC Medications - No data to display  Initial Impression / Assessment and Plan / UC Course  I have reviewed the triage vital signs and the nursing notes.  Pertinent labs & imaging results that were available during my care of the patient were reviewed by me and considered in my medical decision making (see chart for details).     Patient was evaluated for RT ankle tendonitis. RT ankle X-Ray showed know fracture or abnormalities. Ace bandage provided. Patient made aware of RICE protocol.Ibuprofen 800 mg was prescribed, patient was made aware of regimen. Information given for follow up with [facility] if symptoms are not improving. Work note was given. He was made of timeline for symptoms resolution. Patient verbalized understanding.  Charting was provided using a a verbal dictation system, charting was proofread for errors, errors may occur which could change the meaning of the information charted.   Final Clinical Impressions(s) / UC Diagnoses    Final diagnoses:  Tendinitis of right ankle     Discharge Instructions      Ibuprofen 800 mg has been sent to the pharmacy, please make sure you put food on your stomach when taking this medication.   Rest, ice, compression, and elevation can help with symptom management.  It will help you to rest the affected area.  Use ice to help with inflammation at the site of injury.  You may use a compression bandage or sleeve to provide support for the affected area.  Providing elevation for the affected extremity when able can aid healing.   [facility] information has been placed on your discharge paperwork in the case that your symptoms are not improving.       ED Prescriptions     Medication Sig Dispense Auth. Provider   ibuprofen (ADVIL) 800 MG tablet Take 1 tablet (800 mg total) by mouth 2 (two) times daily as needed for moderate pain. 21 tablet Flossie Dibble, NP      PDMP not reviewed this encounter.   Flossie Dibble, NP 03/17/22 2124    Flossie Dibble, NP 03/17/22 2125

## 2022-03-16 NOTE — Discharge Instructions (Signed)
Ibuprofen 800 mg has been sent to the pharmacy, please make sure you put food on your stomach when taking this medication.   Rest, ice, compression, and elevation can help with symptom management.  It will help you to rest the affected area.  Use ice to help with inflammation at the site of injury.  You may use a compression bandage or sleeve to provide support for the affected area.  Providing elevation for the affected extremity when able can aid healing.   [facility] information has been placed on your discharge paperwork in the case that your symptoms are not improving.

## 2022-03-17 ENCOUNTER — Other Ambulatory Visit (HOSPITAL_COMMUNITY): Payer: Self-pay

## 2022-03-17 ENCOUNTER — Other Ambulatory Visit: Payer: Self-pay

## 2022-03-18 ENCOUNTER — Other Ambulatory Visit (HOSPITAL_COMMUNITY): Payer: Self-pay

## 2022-03-18 ENCOUNTER — Telehealth (INDEPENDENT_AMBULATORY_CARE_PROVIDER_SITE_OTHER): Payer: 59 | Admitting: Nurse Practitioner

## 2022-03-18 ENCOUNTER — Telehealth: Payer: Self-pay

## 2022-03-18 DIAGNOSIS — M25571 Pain in right ankle and joints of right foot: Secondary | ICD-10-CM

## 2022-03-18 DIAGNOSIS — E1169 Type 2 diabetes mellitus with other specified complication: Secondary | ICD-10-CM

## 2022-03-18 DIAGNOSIS — I119 Hypertensive heart disease without heart failure: Secondary | ICD-10-CM

## 2022-03-18 DIAGNOSIS — I2510 Atherosclerotic heart disease of native coronary artery without angina pectoris: Secondary | ICD-10-CM

## 2022-03-18 DIAGNOSIS — I10 Essential (primary) hypertension: Secondary | ICD-10-CM

## 2022-03-18 DIAGNOSIS — Z7984 Long term (current) use of oral hypoglycemic drugs: Secondary | ICD-10-CM

## 2022-03-18 MED ORDER — OZEMPIC (0.25 OR 0.5 MG/DOSE) 2 MG/3ML ~~LOC~~ SOPN
0.5000 mg | PEN_INJECTOR | SUBCUTANEOUS | 1 refills | Status: DC
  Filled 2022-03-18: qty 3, 28d supply, fill #0
  Filled 2022-04-28 (×2): qty 9, 84d supply, fill #0
  Filled 2022-04-30: qty 3, 28d supply, fill #0

## 2022-03-18 NOTE — Telephone Encounter (Signed)
Transition Care Management Follow-up Telephone Call Date of discharge and from where: 03/16/2022 [facility] How have you been since you were released from the hospital? Pt states he is doing much better today.  Any questions or concerns? No  Items Reviewed: Did the pt receive and understand the discharge instructions provided? Yes  Medications obtained and verified? Yes  Other? Yes  Any new allergies since your discharge? No  Dietary orders reviewed? Yes Do you have support at home? Yes   Home Care and Equipment/Supplies: Were home health services ordered? no If so, what is the name of the agency? N/a  Has the agency set up a time to come to the patient's home? no Were any new equipment or medical supplies ordered?  No What is the name of the medical supply agency? N/a Were you able to get the supplies/equipment? no Do you have any questions related to the use of the equipment or supplies? No  Functional Questionnaire: (I = Independent and D = Dependent) ADLs: i  Bathing/Dressing- i i Meal Prep- i  Eating- i  Maintaining continence- i  Transferring/Ambulation- i  Managing Meds- i  Follow up appointments reviewed:  PCP Hospital f/u appt confirmed? Yes  Scheduled to see [name] on 03/18/2022 @ virtually. [facility] f/u appt confirmed? No  Scheduled to see n/a on n/a @ n/a. Are transportation arrangements needed? No  If their condition worsens, is the pt aware to call PCP or go to the Emergency Dept.? Yes Was the patient provided with contact information for the PCP's office or ED? Yes Was to pt encouraged to call back with questions or concerns? Yes

## 2022-03-18 NOTE — Progress Notes (Signed)
Virtual Visit via MyChart   This visit type was conducted due to national recommendations for restrictions regarding the COVID-19 Pandemic (e.g. social distancing) in an effort to limit this patient's exposure and mitigate transmission in our community.  Due to his co-morbid illnesses, this patient is at least at moderate risk for complications without adequate follow up.  This format is felt to be most appropriate for this patient at this time.  All issues noted in this document were discussed and addressed.  A limited physical exam was performed with this format.    This visit type was conducted due to national recommendations for restrictions regarding the COVID-19 Pandemic (e.g. social distancing) in an effort to limit this patient's exposure and mitigate transmission in our community.  Patients identity confirmed using two different identifiers.  This format is felt to be most appropriate for this patient at this time.  All issues noted in this document were discussed and addressed.  No physical exam was performed (except for noted visual exam findings with Video Visits).    Date:  03/21/2022   ID:  Genice Rouge, DOB [birth year], MRN [id number]  Patient Location:  Home-spoke with Genice Rouge  Provider location:   Office    Chief Complaint:  f/u ER visit  History of Present Illness:    Ho Parisi is a 46 y.o. male who presents via video conferencing for a telehealth visit today.    The patient does not have symptoms concerning for COVID-19 infection (fever, chills, cough, or new shortness of breath).   Pt states he had experienced soreness & pain with his right ankle along with swelling over the holiday. He was prescribed ibuprofen and is able to stand and walk around. He was treated with ibuprofen. He has not reached out to the orthopedic. 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 He has to wear [employer].   He visited ED on 12/26.  He was given  ibuprofen.   He is currently est with a orthopedic. Rod Mae   Patient also states he had his last ozempic injection last month.      Past Medical History:  Diagnosis Date   Diabetes mellitus without complication ([place])    Hypertension    borderline   PVC (premature ventricular contraction) 04/29/2018   Sleep apnea    uses a cpap   Snoring    Past Surgical History:  Procedure Laterality Date   INSERTION OF MESH N/A 11/03/2017   Procedure: INSERTION OF MESH;  Surgeon: Erroll Luna, MD;  Location: [place];  Service: General;  Laterality: N/A;   SHOULDER ACROMIOPLASTY Right 10/29/2020   Procedure: RIGHT SHOULDER ARTHROSCOPY, EXTENSIVE DEBRIDEMENT, DISTAL CLAVICLE EXCISION, SUBACROMIAL DECOMPRESSION, BICEPS TENODESIS;  Surgeon: Leandrew Koyanagi, MD;  Location: [place];  Service: Orthopedics;  Laterality: Right;   UMBILICAL HERNIA REPAIR  96/[id number]/9163   UMBILICAL HERNIA REPAIR N/A 11/03/2017   Procedure: UMBILICAL HERNIA REPAIR WITH MESH;  Surgeon: Erroll Luna, MD;  Location: [place];  Service: General;  Laterality: N/A;   WRIST SURGERY Left      Current Meds  Medication Sig   aspirin EC 81 MG tablet Take 81 mg by mouth daily.   diclofenac Sodium (VOLTAREN) 1 % GEL Apply 4 g topically 4 (four) times daily. Apply to affected areas 4 times daily as needed for pain.   hydrochlorothiazide (HYDRODIURIL) 25 MG tablet TAKE 1 TABLET(25 MG) BY MOUTH DAILY   ibuprofen (ADVIL) 800 MG tablet  Take 1 tablet (800 mg total) by mouth 2 (two) times daily as needed for moderate pain.   losartan (COZAAR) 25 MG tablet As directed   ondansetron (ZOFRAN) 4 MG tablet Take 1-2 tablets (4-8 mg total) by mouth every 8 (eight) hours as needed for nausea or vomiting.   Semaglutide,0.25 or 0.5MG/DOS, (OZEMPIC, 0.25 OR 0.5 MG/DOSE,) 2 MG/3ML SOPN Inject 0.5 mg into the skin once a week.   UNABLE TO FIND CPAP: At bedtime   [DISCONTINUED] metFORMIN (GLUCOPHAGE) 500 MG tablet TAKE 1 TABLET(500 MG)  BY MOUTH TWICE DAILY WITH A MEAL     Allergies:   Patient has no known allergies.   Social History   Tobacco Use   Smoking status: Former    Types: Cigarettes    Quit date: 03/23/2003    Years since quitting: 19.0   Smokeless tobacco: Never  Vaping Use   Vaping Use: Never used  Substance Use Topics   Alcohol use: No   Drug use: No     Family Hx: The patient's family history includes Aneurysm in his paternal grandmother; Diabetes in his brother, father, and sister; Hypertension in his brother and sister.  ROS:   Please see the history of present illness.    Review of Systems  Constitutional: Negative.   HENT: Negative.    Eyes: Negative.   Gastrointestinal: Negative.   Genitourinary: Negative.   Musculoskeletal:  Positive for joint pain (right ankle pain).  Neurological: Negative.   Endo/Heme/Allergies: Negative.   Psychiatric/Behavioral: Negative.      All other systems reviewed and are negative.   Labs/Other Tests and Data Reviewed:    Recent Labs: 05/06/2021: ALT 24 12/14/2021: BUN 14; Creatinine, Ser 0.87; Potassium 4.0; Sodium 143   Recent Lipid Panel Lab Results  Component Value Date/Time   CHOL 146 05/06/2021 11:32 AM   TRIG 76 05/06/2021 11:32 AM   HDL 36 (L) 05/06/2021 11:32 AM   CHOLHDL 4.1 05/06/2021 11:32 AM   LDLCALC 95 05/06/2021 11:32 AM    Wt Readings from Last 3 Encounters:  12/14/21 (!) 340 lb 6.4 oz (154.4 kg)  08/14/21 (!) 320 lb (145.2 kg)  05/06/21 (!) 345 lb 9.6 oz (156.8 kg)     Exam:    Vital Signs:  There were no vitals taken for this visit.    Physical Exam Constitutional:      General: He is not in acute distress.    Appearance: Normal appearance.  Pulmonary:     Effort: Pulmonary effort is normal. No respiratory distress.  Neurological:     General: No focal deficit present.     Mental Status: He is alert and oriented to person, place, and time. Mental status is at baseline.     Cranial Nerves: No cranial nerve  deficit.  Psychiatric:        Mood and Affect: Mood and affect normal.        Behavior: Behavior normal.        Thought Content: Thought content normal.        Cognition and Memory: Memory normal.        Judgment: Judgment normal.     ASSESSMENT & PLAN:    Right ankle pain, unspecified chronicity - Pain is better after treatment, advised to contact his orthopedic as well to inform  Essential hypertension - Continue current medications. - Plan: BMP8+eGFR  Diabetes mellitus type 2 in obese [facility]) - He had ran out of Ozempic one month ago advised to make [place]  aware when this happens to see if we need to make any changes. Labs next week - Plan: Semaglutide,0.25 or 0.5MG/DOS, (OZEMPIC, 0.25 OR 0.5 MG/DOSE,) 2 MG/3ML SOPN, Hemoglobin A1c, CANCELED: Hemoglobin A1c, CANCELED: BMP8+eGFR  Atherosclerotic cardiovascular disease - Continue statin, tolerating well. - Plan: Lipid panel   COVID-19 Education: The signs and symptoms of COVID-19 were discussed with the patient and how to seek care for testing (follow up with PCP or arrange E-visit).  The importance of social distancing was discussed today.  Patient Risk:   After full review of this patients clinical status, I feel that they are at least moderate risk at this time.  Time:   Today, I have spent 10 minutes/ seconds with the patient with telehealth technology discussing above diagnoses.     Medication Adjustments/Labs and Tests Ordered: Current medicines are reviewed at length with the patient today.  Concerns regarding medicines are outlined above.   Tests Ordered: Orders Placed This Encounter  Procedures   Hemoglobin A1c   BMP8+eGFR   Lipid panel    Medication Changes: Meds ordered this encounter  Medications   Semaglutide,0.25 or 0.5MG/DOS, (OZEMPIC, 0.25 OR 0.5 MG/DOSE,) 2 MG/3ML SOPN    Sig: Inject 0.5 mg into the skin once a week.    Dispense:  9 mL    Refill:  1    Disposition:  Follow up in 4  month(s)  Signed, [name], FNP

## 2022-03-21 ENCOUNTER — Encounter: Payer: Self-pay | Admitting: Nurse Practitioner

## 2022-03-31 ENCOUNTER — Other Ambulatory Visit (HOSPITAL_COMMUNITY): Payer: Self-pay

## 2022-04-12 ENCOUNTER — Other Ambulatory Visit: Payer: 59

## 2022-04-12 DIAGNOSIS — E1169 Type 2 diabetes mellitus with other specified complication: Secondary | ICD-10-CM

## 2022-04-12 DIAGNOSIS — I10 Essential (primary) hypertension: Secondary | ICD-10-CM | POA: Diagnosis not present

## 2022-04-12 DIAGNOSIS — I2510 Atherosclerotic heart disease of native coronary artery without angina pectoris: Secondary | ICD-10-CM | POA: Diagnosis not present

## 2022-04-12 DIAGNOSIS — E669 Obesity, unspecified: Secondary | ICD-10-CM | POA: Diagnosis not present

## 2022-04-13 LAB — BMP8+EGFR
BUN/Creatinine Ratio: 15 (ref 9–20)
BUN: 13 mg/dL (ref 6–24)
CO2: 24 mmol/L (ref 20–29)
Calcium: 9 mg/dL (ref 8.7–10.2)
Chloride: 98 mmol/L (ref 96–106)
Creatinine, Ser: 0.86 mg/dL (ref 0.76–1.27)
Glucose: 219 mg/dL — ABNORMAL HIGH (ref 70–99)
Potassium: 4 mmol/L (ref 3.5–5.2)
Sodium: 139 mmol/L (ref 134–144)
eGFR: 109 mL/min/{1.73_m2} (ref >59)

## 2022-04-13 LAB — LIPID PANEL
Chol/HDL Ratio: 4.2 ratio (ref 0.0–5.0)
Cholesterol, Total: 177 mg/dL (ref 100–199)
HDL: 42 mg/dL (ref >39)
LDL Chol Calc (NIH): 118 mg/dL — ABNORMAL HIGH (ref 0–99)
Triglycerides: 91 mg/dL (ref 0–149)
VLDL Cholesterol Cal: 17 mg/dL (ref 5–40)

## 2022-04-13 LAB — HEMOGLOBIN A1C
Est. average glucose Bld gHb Est-mCnc: 186 mg/dL
Hgb A1c MFr Bld: 8.1 % — ABNORMAL HIGH (ref 4.8–5.6)

## 2022-04-28 ENCOUNTER — Other Ambulatory Visit (HOSPITAL_BASED_OUTPATIENT_CLINIC_OR_DEPARTMENT_OTHER): Payer: Self-pay

## 2022-04-28 ENCOUNTER — Other Ambulatory Visit: Payer: Self-pay

## 2022-04-28 ENCOUNTER — Other Ambulatory Visit (HOSPITAL_COMMUNITY): Payer: Self-pay

## 2022-04-30 ENCOUNTER — Other Ambulatory Visit (HOSPITAL_COMMUNITY): Payer: Self-pay

## 2022-04-30 ENCOUNTER — Encounter: Payer: Self-pay | Admitting: Nurse Practitioner

## 2022-04-30 DIAGNOSIS — E119 Type 2 diabetes mellitus without complications: Secondary | ICD-10-CM

## 2022-05-03 DIAGNOSIS — E119 Type 2 diabetes mellitus without complications: Secondary | ICD-10-CM | POA: Insufficient documentation

## 2022-05-12 ENCOUNTER — Ambulatory Visit (INDEPENDENT_AMBULATORY_CARE_PROVIDER_SITE_OTHER): Payer: 59 | Admitting: Nurse Practitioner

## 2022-05-12 ENCOUNTER — Other Ambulatory Visit: Payer: Self-pay | Admitting: Nurse Practitioner

## 2022-05-12 ENCOUNTER — Encounter: Payer: Self-pay | Admitting: Nurse Practitioner

## 2022-05-12 VITALS — BP 136/76 | HR 99 | Temp 98.2°F | Ht 75.0 in | Wt 338.6 lb

## 2022-05-12 DIAGNOSIS — E669 Obesity, unspecified: Secondary | ICD-10-CM

## 2022-05-12 DIAGNOSIS — E1169 Type 2 diabetes mellitus with other specified complication: Secondary | ICD-10-CM

## 2022-05-12 DIAGNOSIS — Z2821 Immunization not carried out because of patient refusal: Secondary | ICD-10-CM

## 2022-05-12 DIAGNOSIS — I2510 Atherosclerotic heart disease of native coronary artery without angina pectoris: Secondary | ICD-10-CM

## 2022-05-12 DIAGNOSIS — I10 Essential (primary) hypertension: Secondary | ICD-10-CM

## 2022-05-12 MED ORDER — HYDROCHLOROTHIAZIDE 25 MG PO TABS: ORAL_TABLET | ORAL | 1 refills | Status: DC

## 2022-05-12 MED ORDER — LOSARTAN POTASSIUM 25 MG PO TABS: ORAL_TABLET | ORAL | 1 refills | Status: DC

## 2022-05-12 MED ORDER — ATORVASTATIN CALCIUM 20 MG PO TABS: 20.0000 mg | ORAL_TABLET | Freq: Every day | ORAL | 1 refills | Status: DC

## 2022-05-12 MED ORDER — MOUNJARO 2.5 MG/0.5ML ~~LOC~~ SOAJ: 2.5000 mg | SUBCUTANEOUS | 0 refills | Status: DC

## 2022-05-12 MED ORDER — TRULICITY 0.75 MG/0.5ML ~~LOC~~ SOAJ: 0.7500 mg | SUBCUTANEOUS | 0 refills | Status: DC

## 2022-05-12 NOTE — Progress Notes (Signed)
Barnet Glasgow Martin,acting as a Education administrator for [name], FNP.,have documented all relevant documentation on the behalf of [name], FNP,as directed by  [name], FNP while in the presence of [name], [place].    Subjective:     Patient ID: Luis Lopez , male    DOB: Jul 28, 1975 , 47 y.o.   MRN: [id number]   Chief Complaint  Patient presents with   Diabetes    HPI  Patient presents today for a dm and bp check. Patient states compliance with medications and has no other concerns today. Patient wants to discuss the trulicity he has not yet started. He had been out of Ozempic for quite some time before his last visit with his HgbA1c. He is getting back to exercising and eating healthy. He is eating more vegetables and some fruits (mostly vegetables).   BP Readings from Last 3 Encounters: 05/12/22 : 136/76 03/16/22 : (!) 140/83 03/08/22 : (!) 153/95  Wt Readings from Last 3 Encounters: 05/12/22 : (!) 338 lb 9.6 oz (153.6 kg) 12/14/21 : (!) 340 lb 6.4 oz (154.4 kg) 08/14/21 : (!) 320 lb (145.2 kg)     Diabetes He presents for his follow-up diabetic visit. He has type 2 diabetes mellitus. His disease course has been worsening. There are no diabetic associated symptoms. He is following a generally unhealthy diet. When asked about meal planning, he reported none. (Blood sugar 120's )     Past Medical History:  Diagnosis Date   Diabetes mellitus without complication (HCC)    Hypertension    borderline   PVC (premature ventricular contraction) 04/29/2018   Sleep apnea    uses a cpap   Snoring      Family History  Problem Relation Age of Onset   Diabetes Father    Diabetes Brother    Hypertension Brother    Diabetes Sister    Hypertension Sister    Aneurysm Paternal Grandmother        brain     Current Outpatient Medications:    aspirin EC 81 MG tablet, Take 81 mg by mouth daily., Disp: , Rfl:    atorvastatin (LIPITOR) 20 MG tablet, Take 1 tablet (20 mg total) by  mouth daily., Disp: 90 tablet, Rfl: 1   diclofenac Sodium (VOLTAREN) 1 % GEL, Apply 4 g topically 4 (four) times daily. Apply to affected areas 4 times daily as needed for pain., Disp: 100 g, Rfl: 2   Dulaglutide (TRULICITY) A999333 0000000 SOPN, Inject 0.75 mg into the skin once a week., Disp: 2 mL, Rfl: 0   ibuprofen (ADVIL) 800 MG tablet, Take 1 tablet (800 mg total) by mouth 2 (two) times daily as needed for moderate pain., Disp: 21 tablet, Rfl: 0   metFORMIN (GLUCOPHAGE) 500 MG tablet, Take 500 mg by mouth 2 (two) times daily., Disp: , Rfl:    ondansetron (ZOFRAN) 4 MG tablet, Take 1-2 tablets (4-8 mg total) by mouth every 8 (eight) hours as needed for nausea or vomiting., Disp: 20 tablet, Rfl: 0   UNABLE TO FIND, CPAP: At bedtime, Disp: , Rfl:    Glucagon (GVOKE HYPOPEN 2-PACK) 0.5 MG/0.1ML SOAJ, Inject 0.5 mg into the skin as needed. (Patient not taking: Reported on 03/18/2022), Disp: 0.2 mL, Rfl: 2   hydrochlorothiazide (HYDRODIURIL) 25 MG tablet, TAKE 1 TABLET(25 MG) BY MOUTH DAILY, Disp: 90 tablet, Rfl: 1   losartan (COZAAR) 25 MG tablet, As directed, Disp: 90 tablet, Rfl: 1   No Known Allergies   Review  of Systems  Constitutional: Negative.   HENT: Negative.    Eyes: Negative.   Respiratory: Negative.    Cardiovascular: Negative.   Gastrointestinal: Negative.      Today's Vitals   05/12/22 1517  BP: 136/76  Pulse: 99  Temp: 98.2 F (36.8 C)  TempSrc: Oral  Weight: (!) 338 lb 9.6 oz (153.6 kg)  Height: 6' 3"$  (1.905 m)  PainSc: 0-No pain   Body mass index is 42.32 kg/m.  Wt Readings from Last 3 Encounters:  05/12/22 (!) 338 lb 9.6 oz (153.6 kg)  12/14/21 (!) 340 lb 6.4 oz (154.4 kg)  08/14/21 (!) 320 lb (145.2 kg)    Objective:  Physical Exam Vitals reviewed.  Constitutional:      General: He is not in acute distress.    Appearance: Normal appearance. He is obese.  Pulmonary:     Effort: Pulmonary effort is normal. No respiratory distress.  Neurological:      General: No focal deficit present.     Mental Status: He is alert and oriented to person, place, and time.     Cranial Nerves: No cranial nerve deficit.     Motor: No weakness.  Psychiatric:        Mood and Affect: Mood normal.        Behavior: Behavior normal.        Thought Content: Thought content normal.        Judgment: Judgment normal.         Assessment And Plan:     1. Diabetes mellitus type 2 in obese [facility]) Comments: Discussed new medication Trulicity and demonstrated how to inject. Will f/u in 2 months with HgbA1c. Encouraged to increase physical activity and change diet. - Ambulatory referral to Ophthalmology  2. Essential hypertension Comments: Blood pressure is fairly controlled, continue current medications. Continue to focus on lifestyle modifications - hydrochlorothiazide (HYDRODIURIL) 25 MG tablet; TAKE 1 TABLET(25 MG) BY MOUTH DAILY  Dispense: 90 tablet; Refill: 1 - losartan (COZAAR) 25 MG tablet; As directed  Dispense: 90 tablet; Refill: 1  3. Morbid obesity due to excess calories (HCC) Chronic Discussed healthy diet and regular exercise options  Encouraged to exercise at least 150 minutes per week with 2 days of strength training  4. Influenza vaccination declined Patient declined influenza vaccination at this time. Patient is aware that influenza vaccine prevents illness in 70% of healthy people, and reduces hospitalizations to 30-70% in elderly. This vaccine is recommended annually. Education has been provided regarding the importance of this vaccine but patient still declined. Advised may receive this vaccine at local pharmacy or Health Dept.or vaccine clinic. Aware to provide a copy of the vaccination record if obtained from local pharmacy or Health Dept.  Pt is willing to accept risk associated with refusing vaccination.  5. COVID-19 vaccination declined Declines covid 19 vaccine. Discussed risk of covid 68 and if he changes her mind about the vaccine to call  the office. Education has been provided regarding the importance of this vaccine but patient still declined. Advised may receive this vaccine at local pharmacy or Health Dept.or vaccine clinic. Aware to provide a copy of the vaccination record if obtained from local pharmacy or Health Dept.  Encouraged to take multivitamin, vitamin d, vitamin c and zinc to increase immune system. Aware can call office if would like to have vaccine here at office. Verbalized acceptance and understanding.    Patient was given opportunity to ask questions. Patient verbalized understanding of the plan and  was able to repeat key elements of the plan. All questions were answered to their satisfaction.  [name], FNP   I, [name], FNP, have reviewed all documentation for this visit. The documentation on 05/12/22 for the exam, diagnosis, procedures, and orders are all accurate and complete.   IF YOU HAVE BEEN REFERRED TO A SPECIALIST, IT MAY TAKE 1-2 WEEKS TO SCHEDULE/PROCESS THE REFERRAL. IF YOU HAVE NOT HEARD FROM US/SPECIALIST IN TWO WEEKS, PLEASE GIVE [place] A CALL AT [phone].   THE PATIENT IS ENCOURAGED TO PRACTICE SOCIAL DISTANCING DUE TO THE COVID-19 PANDEMIC.

## 2022-06-04 ENCOUNTER — Other Ambulatory Visit: Payer: Self-pay | Admitting: Nurse Practitioner

## 2022-06-04 DIAGNOSIS — E1169 Type 2 diabetes mellitus with other specified complication: Secondary | ICD-10-CM

## 2022-06-07 ENCOUNTER — Other Ambulatory Visit: Payer: Self-pay | Admitting: Nurse Practitioner

## 2022-06-14 ENCOUNTER — Ambulatory Visit: Payer: 59 | Admitting: Nurse Practitioner

## 2022-06-22 ENCOUNTER — Ambulatory Visit (INDEPENDENT_AMBULATORY_CARE_PROVIDER_SITE_OTHER): Payer: 59 | Admitting: Nurse Practitioner

## 2022-06-22 ENCOUNTER — Encounter: Payer: Self-pay | Admitting: Nurse Practitioner

## 2022-06-22 VITALS — BP 124/84 | HR 74 | Temp 98.0°F | Ht 75.0 in | Wt 336.4 lb

## 2022-06-22 DIAGNOSIS — E1169 Type 2 diabetes mellitus with other specified complication: Secondary | ICD-10-CM

## 2022-06-22 DIAGNOSIS — I152 Hypertension secondary to endocrine disorders: Secondary | ICD-10-CM | POA: Diagnosis not present

## 2022-06-22 DIAGNOSIS — Z6841 Body Mass Index (BMI) 40.0 and over, adult: Secondary | ICD-10-CM | POA: Diagnosis not present

## 2022-06-22 DIAGNOSIS — I119 Hypertensive heart disease without heart failure: Secondary | ICD-10-CM

## 2022-06-22 DIAGNOSIS — E669 Obesity, unspecified: Secondary | ICD-10-CM

## 2022-06-22 DIAGNOSIS — E1159 Type 2 diabetes mellitus with other circulatory complications: Secondary | ICD-10-CM

## 2022-06-22 DIAGNOSIS — Z1211 Encounter for screening for malignant neoplasm of colon: Secondary | ICD-10-CM

## 2022-06-22 NOTE — Progress Notes (Signed)
I,Luis Lopez,acting as a Education administrator for Luis Brine, FNP.,have documented all relevant documentation on the behalf of Luis Brine, FNP,as directed by  Luis Brine, FNP while in the presence of Luis Lopez, [name].    Subjective:     Patient ID: Luis Lopez , male    DOB: 12/28/1975 , 47 y.o.   MRN: [id number]   Chief Complaint  Patient presents with   Hypertension   Diabetes    HPI  Patient presents today for hypertension follow up. He is taking Trulicity once a week at 0.75 mg, he is exercising 3 days a week with 30 minutes of walking.   Wt Readings from Last 3 Encounters: 06/22/22 : (!) 336 lb 6.4 oz (152.6 kg) 05/12/22 : (!) 338 lb 9.6 oz (153.6 kg) 12/14/21 : (!) 340 lb 6.4 oz (154.4 kg)    Diabetes He presents for his follow-up diabetic visit. He has type 2 diabetes mellitus. His disease course has been worsening. Pertinent negatives for hypoglycemia include no headaches. There are no diabetic associated symptoms. Pertinent negatives for diabetes include no chest pain. There are no diabetic complications. Risk factors for coronary artery disease include obesity, male sex, hypertension, dyslipidemia and diabetes mellitus. Current diabetic treatment includes diet and oral agent (dual therapy). He is following a generally healthy (he is working on his diet) diet. When asked about meal planning, he reported none. He has not had a previous visit with a dietitian. He participates in exercise three times a week. (Blood sugar has come down from 200's to 100's) An ACE inhibitor/angiotensin II receptor blocker is being taken. He does not see a podiatrist.Eye exam current: appt in August 2024.  Hypertension This is a chronic problem. The current episode started more than 1 year ago. The problem has been gradually worsening since onset. The problem is uncontrolled. Pertinent negatives include no anxiety, chest pain, headaches, malaise/fatigue or palpitations. Risk factors for coronary artery  disease include obesity and sedentary lifestyle. There are no compliance problems.  There is no history of angina. There is no history of chronic renal disease.     Past Medical History:  Diagnosis Date   Diabetes mellitus without complication    Hypertension    borderline   PVC (premature ventricular contraction) 04/29/2018   Sleep apnea    uses a cpap   Snoring      Family History  Problem Relation Age of Onset   Diabetes Father    Diabetes Brother    Hypertension Brother    Diabetes Sister    Hypertension Sister    Aneurysm Paternal Grandmother        brain     Current Outpatient Medications:    aspirin EC 81 MG tablet, Take 81 mg by mouth daily., Disp: , Rfl:    atorvastatin (LIPITOR) 20 MG tablet, Take 1 tablet (20 mg total) by mouth daily., Disp: 90 tablet, Rfl: 1   diclofenac Sodium (VOLTAREN) 1 % GEL, Apply 4 g topically 4 (four) times daily. Apply to affected areas 4 times daily as needed for pain., Disp: 100 g, Rfl: 2   Dulaglutide (TRULICITY) A999333 0000000 SOPN, INJECT 0.75 MG SUBCUTANEOUSLY ONE TIME PER WEEK, Disp: 2 mL, Rfl: 1   Glucagon (GVOKE HYPOPEN 2-PACK) 0.5 MG/0.1ML SOAJ, Inject 0.5 mg into the skin as needed. (Patient not taking: Reported on 03/18/2022), Disp: 0.2 mL, Rfl: 2   hydrochlorothiazide (HYDRODIURIL) 25 MG tablet, TAKE 1 TABLET(25 MG) BY MOUTH DAILY, Disp: 90 tablet, Rfl: 1  ibuprofen (ADVIL) 800 MG tablet, Take 1 tablet (800 mg total) by mouth 2 (two) times daily as needed for moderate pain., Disp: 21 tablet, Rfl: 0   losartan (COZAAR) 25 MG tablet, As directed, Disp: 90 tablet, Rfl: 1   metFORMIN (GLUCOPHAGE) 500 MG tablet, TAKE 1 TABLET(500 MG) BY MOUTH TWICE DAILY WITH A MEAL, Disp: 60 tablet, Rfl: 2   ondansetron (ZOFRAN) 4 MG tablet, Take 1-2 tablets (4-8 mg total) by mouth every 8 (eight) hours as needed for nausea or vomiting., Disp: 20 tablet, Rfl: 0   UNABLE TO FIND, CPAP: At bedtime, Disp: , Rfl:    No Known Allergies   Review of  Systems  Constitutional: Negative.  Negative for malaise/fatigue.  Respiratory: Negative.    Cardiovascular: Negative.  Negative for chest pain, palpitations and leg swelling.  Gastrointestinal: Negative.   Neurological: Negative.  Negative for headaches.  Psychiatric/Behavioral: Negative.       Today's Vitals   06/22/22 0841 06/22/22 0916  BP: (!) 126/90 124/84  Pulse: 74   Temp: 98 F (36.7 C)   TempSrc: Oral   SpO2: 94%   Weight: (!) 336 lb 6.4 oz (152.6 kg)   Height: 6\' 3"  (1.905 m)    Body mass index is 42.05 kg/m.   Objective:  Physical Exam Vitals reviewed.  Constitutional:      General: He is not in acute distress.    Appearance: Normal appearance. He is obese.  Cardiovascular:     Rate and Rhythm: Normal rate and regular rhythm.     Pulses: Normal pulses.     Heart sounds: Normal heart sounds. No murmur heard. Pulmonary:     Effort: Pulmonary effort is normal. No respiratory distress.     Breath sounds: Normal breath sounds. No wheezing.  Skin:    Capillary Refill: Capillary refill takes less than 2 seconds.  Neurological:     General: No focal deficit present.     Mental Status: He is alert and oriented to person, place, and time.     Cranial Nerves: No cranial nerve deficit.     Motor: No weakness.  Psychiatric:        Mood and Affect: Mood normal.        Behavior: Behavior normal.        Thought Content: Thought content normal.        Judgment: Judgment normal.         Assessment And Plan:     1. Hypertensive heart disease without heart failure Comments: Blood pressure is elevated but improved with repeat. Encouraged to focus on lifestyle modification - BMP8+eGFR  2. Obesity, diabetes, and hypertension syndrome Comments: Tolerating Trulicity well, will recheck HgbA1c. - Hemoglobin A1c - BMP8+eGFR  3. Morbid obesity due to excess calories Chronic Discussed healthy diet and regular exercise options  Encouraged to exercise at least 150  minutes per week with 2 days of strength training he is encouraged to strive for BMI less than 30 to decrease cardiac risk.   4. BMI 40.0-44.9, adult  5. Encounter for screening colonoscopy According to USPTF Colorectal cancer Screening guidelines. Colonoscopy is recommended every 10 years, starting at age 74 years. Will refer to GI for colon cancer screening. - Ambulatory referral to Gastroenterology     Patient was given opportunity to ask questions. Patient verbalized understanding of the plan and was able to repeat key elements of the plan. All questions were answered to their satisfaction.  Luis Brine, FNP   I, Doreene Burke  Laurance Flatten, [place], have reviewed all documentation for this visit. The documentation on 06/22/22 for the exam, diagnosis, procedures, and orders are all accurate and complete.   IF YOU HAVE BEEN REFERRED TO A SPECIALIST, IT MAY TAKE 1-2 WEEKS TO SCHEDULE/PROCESS THE REFERRAL. IF YOU HAVE NOT HEARD FROM US/SPECIALIST IN TWO WEEKS, PLEASE GIVE [place] A CALL AT [phone].   THE PATIENT IS ENCOURAGED TO PRACTICE SOCIAL DISTANCING DUE TO THE COVID-19 PANDEMIC.

## 2022-06-23 LAB — BMP8+EGFR
BUN/Creatinine Ratio: 15 (ref 9–20)
BUN: 13 mg/dL (ref 6–24)
CO2: 26 mmol/L (ref 20–29)
Calcium: 9.1 mg/dL (ref 8.7–10.2)
Chloride: 101 mmol/L (ref 96–106)
Creatinine, Ser: 0.84 mg/dL (ref 0.76–1.27)
Glucose: 128 mg/dL — ABNORMAL HIGH (ref 70–99)
Potassium: 4 mmol/L (ref 3.5–5.2)
Sodium: 141 mmol/L (ref 134–144)
eGFR: 110 mL/min/{1.73_m2} (ref >59)

## 2022-06-23 LAB — HEMOGLOBIN A1C
Est. average glucose Bld gHb Est-mCnc: 154 mg/dL
Hgb A1c MFr Bld: 7 % — ABNORMAL HIGH (ref 4.8–5.6)

## 2022-07-07 ENCOUNTER — Telehealth: Payer: Self-pay

## 2022-07-07 NOTE — Telephone Encounter (Signed)
This is not a common side effect. He has taken this type of medication in the past without having those symptoms. Did he check his blood sugar at that time or have any other issues going on at the time? I would not expect this to drop his blood sugar either.

## 2022-07-07 NOTE — Telephone Encounter (Signed)
Spoke with patient and he states he did take his BG and BP during the episode and these were normal. He reports no additional s/s of illness. He states he is willing to try another injection, he is due this Friday. He will contact us to let us know how he is doing. Also advised patient if he has another episode similar to recent one he will need to seek evaluation at [facility] or contact our office immediately. Patient voiced understanding. Nothing further needed at this time.

## 2022-07-07 NOTE — Telephone Encounter (Signed)
Patient called to report after taking his recent Trulicity injection he became dizzy, fatigued and light-headed. He states he felt close to passing out. He reports these symptoms lasted about 5 days. He has looked up side effects from Trulicity and believes this is what happened. He states he is concerned/scared about taking another injection.  Please advise, thanks!

## 2022-07-14 DIAGNOSIS — K5900 Constipation, unspecified: Secondary | ICD-10-CM | POA: Diagnosis not present

## 2022-07-14 DIAGNOSIS — Z1211 Encounter for screening for malignant neoplasm of colon: Secondary | ICD-10-CM | POA: Diagnosis not present

## 2022-07-14 DIAGNOSIS — I10 Essential (primary) hypertension: Secondary | ICD-10-CM | POA: Diagnosis not present

## 2022-07-14 DIAGNOSIS — E1165 Type 2 diabetes mellitus with hyperglycemia: Secondary | ICD-10-CM | POA: Diagnosis not present

## 2022-07-14 LAB — HM COLONOSCOPY

## 2022-07-26 ENCOUNTER — Other Ambulatory Visit: Payer: Self-pay | Admitting: Nurse Practitioner

## 2022-07-26 DIAGNOSIS — E669 Obesity, unspecified: Secondary | ICD-10-CM

## 2022-08-04 DIAGNOSIS — Z1211 Encounter for screening for malignant neoplasm of colon: Secondary | ICD-10-CM | POA: Diagnosis not present

## 2022-08-04 DIAGNOSIS — K5730 Diverticulosis of large intestine without perforation or abscess without bleeding: Secondary | ICD-10-CM | POA: Diagnosis not present

## 2022-08-12 ENCOUNTER — Encounter: Payer: Self-pay | Admitting: Nurse Practitioner

## 2022-08-20 ENCOUNTER — Telehealth: Payer: Self-pay

## 2022-08-20 NOTE — Telephone Encounter (Signed)
PA sent to plan

## 2022-08-24 ENCOUNTER — Ambulatory Visit (INDEPENDENT_AMBULATORY_CARE_PROVIDER_SITE_OTHER): Payer: 59

## 2022-08-24 ENCOUNTER — Encounter (HOSPITAL_COMMUNITY): Payer: Self-pay

## 2022-08-24 ENCOUNTER — Ambulatory Visit (HOSPITAL_COMMUNITY)
Admission: EM | Admit: 2022-08-24 | Discharge: 2022-08-24 | Disposition: A | Discharge status: Home / Self Care | Payer: 59 | Attending: Internal Medicine | Admitting: Internal Medicine

## 2022-08-24 DIAGNOSIS — R600 Localized edema: Secondary | ICD-10-CM | POA: Diagnosis not present

## 2022-08-24 DIAGNOSIS — M25571 Pain in right ankle and joints of right foot: Secondary | ICD-10-CM | POA: Diagnosis not present

## 2022-08-24 DIAGNOSIS — S93401A Sprain of unspecified ligament of right ankle, initial encounter: Secondary | ICD-10-CM | POA: Diagnosis not present

## 2022-08-24 NOTE — ED Triage Notes (Signed)
Patient states he slipped and fell in lotion that was on the floor at [facility] yesterday. Patient has increased pain of the right anke with weight bearing.  Patient took Ibuprofen 800 mg at 1200 today.

## 2022-08-24 NOTE — Discharge Instructions (Addendum)
Your x-rays of your ankle were negative for fracture or dislocation. You likely sprained your ankle.   Wear the ankle brace we provided in the clinic for the next couple of weeks to provide compression, stability, and comfort.  Please rest, ice, and elevate your ankle to help it heal and decrease inflammation.   Take 600mg ibuprofen and/or 1,000mg tylenol every 6 hours as needed for pain and inflammation. Take with food to avoid stomach upset.  Call the orthopedic provider listed on your discharge paperwork to schedule a follow-up appointment if your symptoms do not improve in the next 1-2 weeks with supportive care.  Return to urgent care if you experience worsening pain, numbness, tingling, change of color in your skin near the injury, or any other concerning symptoms.  I hope you feel better!  

## 2022-08-24 NOTE — ED Provider Notes (Signed)
[facility]    CSN: [id number] Arrival date & time: 08/24/22  1243      History   Chief Complaint Chief Complaint  Patient presents with   Ankle Injury    HPI Luis Lopez is a 47 y.o. male.   Patient presents to urgent care for evaluation of right lateral ankle pain as a result of a fall yesterday. He was walking into a store yesterday when he did not see lotion on the floor and he accidentally slipped/fell. When he fell, his right leg went out from under him and went backwards. His left leg went forward forming a "split" position on the floor. He landed on his buttocks but was able to get up off of the floor by himself with minimal assistance. He was ambulatory immediately after injury. Denies hitting his head during fall. Denies preceding dizziness, chest pain, shortness of breath, and hypoglycemia symptoms. He was able to ice the ankle last night and has been wearing compression socks since injury to hep with swelling. Denies previous injury to the right ankle and numbness/tingling distally to injury. Right ankle pain is worse with ambulation. Took 800mg  ibuprofen approximately 1-2 hours ago with some relief.    Ankle Injury    Past Medical History:  Diagnosis Date   Diabetes mellitus without complication (HCC)    Hypertension    borderline   PVC (premature ventricular contraction) 04/29/2018   Sleep apnea    uses a cpap   Snoring     Patient Active Problem List   Diagnosis Date Noted   BMI 40.0-44.9, adult (HCC) 06/22/2022   Hypertensive heart disease without heart failure 06/22/2022   Obesity, diabetes, and hypertension syndrome (HCC) 06/22/2022   Diabetes mellitus type 2 in obese 05/12/2022   Adhesive capsulitis of right shoulder 12/02/2020   Chronic right shoulder pain 12/02/2020   Nontraumatic tear of supraspinatus tendon, right 10/29/2020   Degenerative superior labral anterior-to-posterior (SLAP) tear of right shoulder 10/29/2020   Arthrosis of right  acromioclavicular joint 10/29/2020   Muscle spasm 07/26/2018   Acute bilateral low back pain without sciatica 07/26/2018   PVC (premature ventricular contraction) 04/29/2018   Abnormal EKG 03/01/2018   Essential hypertension 03/01/2018   Umbilical hernia 11/03/2017   Morbid obesity due to excess calories (HCC) 11/06/2015   Nocturia more than twice per night 11/06/2015   Sleep deprivation 11/06/2015   Hypersomnia with sleep apnea 11/06/2015   OSA (obstructive sleep apnea) 11/06/2015    Past Surgical History:  Procedure Laterality Date   INSERTION OF MESH N/A 11/03/2017   Procedure: INSERTION OF MESH;  Surgeon: Harriette Bouillon, MD;  Location: [facility];  Service: General;  Laterality: N/A;   SHOULDER ACROMIOPLASTY Right 10/29/2020   Procedure: RIGHT SHOULDER ARTHROSCOPY, EXTENSIVE DEBRIDEMENT, DISTAL CLAVICLE EXCISION, SUBACROMIAL DECOMPRESSION, BICEPS TENODESIS;  Surgeon: Tarry Kos, MD;  Location:  SURGERY CENTER;  Service: Orthopedics;  Laterality: Right;   UMBILICAL HERNIA REPAIR  11/03/2017   UMBILICAL HERNIA REPAIR N/A 11/03/2017   Procedure: UMBILICAL HERNIA REPAIR WITH MESH;  Surgeon: Harriette Bouillon, MD;  Location: [facility];  Service: General;  Laterality: N/A;   WRIST SURGERY Left        Home Medications    Prior to Admission medications   Medication Sig Start Date End Date Taking? Authorizing Provider  aspirin EC 81 MG tablet Take 81 mg by mouth daily.    [provider]  atorvastatin (LIPITOR) 20 MG tablet Take 1 tablet (20 mg total) by mouth  daily. 05/12/22 05/12/23  Arnette Felts, FNP  diclofenac Sodium (VOLTAREN) 1 % GEL Apply 4 g topically 4 (four) times daily. Apply to affected areas 4 times daily as needed for pain. 11/25/21   Theadora Rama Scales, PA-C  Dulaglutide (TRULICITY) 0.75 MG/0.5ML SOPN INJECT 0.75 MG SUBCUTANEOUSLY ONE TIME PER WEEK 07/26/22   Arnette Felts, FNP  Glucagon (GVOKE HYPOPEN 2-PACK) 0.5 MG/0.1ML SOAJ Inject 0.5 mg into the skin as  needed. Patient not taking: Reported on 03/18/2022 09/03/20   Arnette Felts, FNP  hydrochlorothiazide (HYDRODIURIL) 25 MG tablet TAKE 1 TABLET(25 MG) BY MOUTH DAILY 05/12/22   Arnette Felts, FNP  ibuprofen (ADVIL) 800 MG tablet Take 1 tablet (800 mg total) by mouth 2 (two) times daily as needed for moderate pain. 03/16/22   Debby Freiberg, NP  losartan (COZAAR) 25 MG tablet As directed 05/12/22   Arnette Felts, FNP  metFORMIN (GLUCOPHAGE) 500 MG tablet TAKE 1 TABLET(500 MG) BY MOUTH TWICE DAILY WITH A MEAL 06/07/22   Arnette Felts, FNP  ondansetron (ZOFRAN) 4 MG tablet Take 1-2 tablets (4-8 mg total) by mouth every 8 (eight) hours as needed for nausea or vomiting. 10/29/20   Tarry Kos, MD  UNABLE TO FIND CPAP: At bedtime    [provider]    Family History Family History  Problem Relation Age of Onset   Diabetes Father    Diabetes Brother    Hypertension Brother    Diabetes Sister    Hypertension Sister    Aneurysm Paternal Grandmother        brain    Social History Social History   Tobacco Use   Smoking status: Former    Types: Cigarettes    Quit date: 03/23/2003    Years since quitting: 19.4   Smokeless tobacco: Never  Vaping Use   Vaping Use: Never used  Substance Use Topics   Alcohol use: No   Drug use: No     Allergies   Patient has no known allergies.   Review of Systems Review of Systems Per HPI  Physical Exam Triage Vital Signs ED Triage Vitals [08/24/22 1312]  Enc Vitals Group     BP (!) 155/98     Pulse Rate 78     Resp 16     Temp 97.8 F (36.6 C)     Temp Source Oral     SpO2 94 %     Weight      Height      Head Circumference      Peak Flow      Pain Score      Pain Loc      Pain Edu?      Excl. in GC?    No data found.  Updated Vital Signs BP (!) 155/98 (BP Location: Left Arm)   Pulse 78   Temp 97.8 F (36.6 C) (Oral)   Resp 16   SpO2 94%   Visual Acuity Right Eye Distance:   Left Eye Distance:   Bilateral  Distance:    Right Eye Near:   Left Eye Near:    Bilateral Near:     Physical Exam Vitals and nursing note reviewed.  Constitutional:      Appearance: He is not ill-appearing or toxic-appearing.  HENT:     Head: Normocephalic and atraumatic.     Right Ear: Hearing and external ear normal.     Left Ear: Hearing and external ear normal.     Nose: Nose normal.  Mouth/Throat:     Lips: Pink.  Eyes:     General: Lids are normal. Vision grossly intact. Gaze aligned appropriately.     Extraocular Movements: Extraocular movements intact.     Conjunctiva/sclera: Conjunctivae normal.  Pulmonary:     Effort: Pulmonary effort is normal.  Musculoskeletal:     Cervical back: Neck supple.     Right ankle: No swelling, deformity, ecchymosis or lacerations. Tenderness present over the lateral malleolus. No medial malleolus tenderness. Normal range of motion.     Right Achilles Tendon: Normal.     Left ankle: Normal.     Left Achilles Tendon: Normal.     Right foot: Normal.     Left foot: Normal.     Comments: Strength and sensation intact to bilateral upper and lower extremities (5/5). Moves all 4 extremities with normal coordination voluntarily. Non-focal neuro exam.   Skin:    General: Skin is warm and dry.     Capillary Refill: Capillary refill takes less than 2 seconds.     Findings: No rash.  Neurological:     General: No focal deficit present.     Mental Status: He is alert and oriented to person, place, and time. Mental status is at baseline.     Cranial Nerves: No dysarthria or facial asymmetry.  Psychiatric:        Mood and Affect: Mood normal.        Speech: Speech normal.        Behavior: Behavior normal.        Thought Content: Thought content normal.        Judgment: Judgment normal.      UC Treatments / Results  Labs (all labs ordered are listed, but only abnormal results are displayed) Labs Reviewed - No data to display  EKG   Radiology DG Ankle Complete  Right  Result Date: 08/24/2022 CLINICAL DATA:  Fall, pain EXAM: RIGHT ANKLE - COMPLETE 3+ VIEW COMPARISON:  03/16/2022 FINDINGS: Normal alignment without acute osseous finding or fracture. No subluxation or dislocation. No joint abnormality or significant arthropathy. Diffuse lower extremity and ankle mild soft tissue edema. IMPRESSION: Soft tissue edema. No acute osseous finding. Electronically Signed   By: Judie Petit.  Shick M.D.   On: 08/24/2022 13:54    Procedures Procedures (including critical care time)  Medications Ordered in UC Medications - No data to display  Initial Impression / Assessment and Plan / UC Course  I have reviewed the triage vital signs and the nursing notes.  Pertinent labs & imaging results that were available during my care of the patient were reviewed by me and considered in my medical decision making (see chart for details).   1. Acute right ankle pain Imaging is negative for acute fracture or dislocation.  We will manage this with conservative treatment as an acute sprain to the ankle. RICE advised.  Ankle brace applied in clinic and patient may use this as needed for compression and stability.   May take ibuprofen 656m 6 hours as needed for pain and inflammation at home.  Walking referral to orthopedics given should symptoms fail to improve in the next few weeks with conservative care.   Discussed physical exam and available lab work findings in clinic with patient.  Counseled patient regarding appropriate use of medications and potential side effects for all medications recommended or prescribed today. Discussed red flag signs and symptoms of worsening condition,when to call the PCP office, return to urgent care, and when to seek  higher level of care in the emergency department. Patient verbalizes understanding and agreement with plan. All questions answered. Patient discharged in stable condition.    Final Clinical Impressions(s) / UC Diagnoses   Final diagnoses:   Acute right ankle pain     Discharge Instructions      Your x-rays of your ankle were negative for fracture or dislocation. You likely sprained your ankle.   Wear the ankle brace we provided in the clinic for the next couple of weeks to provide compression, stability, and comfort.  Please rest, ice, and elevate your ankle to help it heal and decrease inflammation.   Take 600mg  ibuprofen and/or 1,000mg  tylenol every 6 hours as needed for pain and inflammation. Take with food to avoid stomach upset.  Call the orthopedic provider listed on your discharge paperwork to schedule a follow-up appointment if your symptoms do not improve in the next 1-2 weeks with supportive care.  Return to urgent care if you experience worsening pain, numbness, tingling, change of color in your skin near the injury, or any other concerning symptoms.  I hope you feel better!   ED Prescriptions   None    PDMP not reviewed this encounter.   Carlisle Beers, [place] 08/24/22 1420

## 2022-09-06 DIAGNOSIS — Z9181 History of falling: Secondary | ICD-10-CM | POA: Diagnosis not present

## 2022-09-06 DIAGNOSIS — G4730 Sleep apnea, unspecified: Secondary | ICD-10-CM | POA: Diagnosis not present

## 2022-09-06 DIAGNOSIS — R609 Edema, unspecified: Secondary | ICD-10-CM | POA: Diagnosis not present

## 2022-09-06 DIAGNOSIS — Z6841 Body Mass Index (BMI) 40.0 and over, adult: Secondary | ICD-10-CM | POA: Diagnosis not present

## 2022-09-06 DIAGNOSIS — Z008 Encounter for other general examination: Secondary | ICD-10-CM | POA: Diagnosis not present

## 2022-09-06 DIAGNOSIS — I10 Essential (primary) hypertension: Secondary | ICD-10-CM | POA: Diagnosis not present

## 2022-09-06 DIAGNOSIS — Z87891 Personal history of nicotine dependence: Secondary | ICD-10-CM | POA: Diagnosis not present

## 2022-09-06 DIAGNOSIS — Z833 Family history of diabetes mellitus: Secondary | ICD-10-CM | POA: Diagnosis not present

## 2022-09-06 DIAGNOSIS — Z7984 Long term (current) use of oral hypoglycemic drugs: Secondary | ICD-10-CM | POA: Diagnosis not present

## 2022-09-06 DIAGNOSIS — E119 Type 2 diabetes mellitus without complications: Secondary | ICD-10-CM | POA: Diagnosis not present

## 2022-09-08 ENCOUNTER — Other Ambulatory Visit: Payer: Self-pay | Admitting: Nurse Practitioner

## 2022-09-21 ENCOUNTER — Encounter: Payer: Self-pay | Admitting: Nurse Practitioner

## 2022-09-21 ENCOUNTER — Ambulatory Visit (INDEPENDENT_AMBULATORY_CARE_PROVIDER_SITE_OTHER): Payer: 59 | Admitting: Nurse Practitioner

## 2022-09-21 DIAGNOSIS — I10 Essential (primary) hypertension: Secondary | ICD-10-CM | POA: Diagnosis not present

## 2022-09-21 DIAGNOSIS — Z7985 Long-term (current) use of injectable non-insulin antidiabetic drugs: Secondary | ICD-10-CM

## 2022-09-21 DIAGNOSIS — E1169 Type 2 diabetes mellitus with other specified complication: Secondary | ICD-10-CM

## 2022-09-21 DIAGNOSIS — Z6841 Body Mass Index (BMI) 40.0 and over, adult: Secondary | ICD-10-CM

## 2022-09-21 MED ORDER — TRULICITY 0.75 MG/0.5ML ~~LOC~~ SOAJ: SUBCUTANEOUS | 1 refills | Status: DC

## 2022-09-21 NOTE — Assessment & Plan Note (Signed)
He is encouraged to strive for BMI less than 30 to decrease cardiac risk. Advised to aim for at least 150 minutes of exercise per week.  

## 2022-09-21 NOTE — Assessment & Plan Note (Signed)
Blood pressure is slightly elevated, continue current medications. Encouraged to focus on lifestyle modifications.

## 2022-09-21 NOTE — Assessment & Plan Note (Signed)
Unfortunately he has not had Trulicity, PA was done but denied will try again, sample of Ozempic given until can get approved for Trulicity. HgbA1c at last visit was improved to below 8.

## 2022-09-21 NOTE — Progress Notes (Signed)
Madelaine Bhat, CMA,acting as a Neurosurgeon for Arnette Felts, FNP.,have documented all relevant documentation on the behalf of Arnette Felts, FNP,as directed by  Arnette Felts, FNP while in the presence of Arnette Felts, FNP.  Subjective:  Patient ID: Luis Lopez , male    DOB: [birth year] , 47 y.o.   MRN: [id number]  Chief Complaint  Patient presents with   Diabetes   Hypertension    HPI  Patient presents today for DM and BP check. Patient reports compliance with medications. Patient denies any chest pain, SOB, and headaches. Patient reports he was has been unable to get trulicity anymore due to his insurance not covering it anymore, patient would like to discuss alternate. Continues to take metformin.  Patient also reports he was seen by a nurse through his insurance and was told yesterday that he had abnormal kidney functions.    Patient reports he had a fall on June 3rd in a store and sprained his ankle, patient reports slipping on lotion while at the store. He was wearing a brace to his ankle but this is better.     BP Readings from Last 3 Encounters: 09/21/22 : 120/88 08/24/22 : (!) 155/98 06/22/22 : 124/84   Wt Readings from Last 3 Encounters: 09/21/22 : (!) 339 lb 12.8 oz (154.1 kg) 06/22/22 : (!) 336 lb 6.4 oz (152.6 kg) 05/12/22 : (!) 338 lb 9.6 oz (153.6 kg)    Diabetes He presents for his follow-up diabetic visit. He has type 2 diabetes mellitus. Pertinent negatives for hypoglycemia include no headaches. There are no diabetic associated symptoms. Pertinent negatives for diabetes include no chest pain. There are no diabetic complications. Risk factors for coronary artery disease include male sex, obesity, diabetes mellitus, hypertension and sedentary lifestyle. Current diabetic treatment includes diet and oral agent (monotherapy) (currently not on Trulicity). His weight is stable. He is following a generally unhealthy diet. When asked about meal planning, he reported none. He has  not had a previous visit with a dietitian. He participates in exercise intermittently. There is no change in his home blood glucose trend. (Blood sugar has been averaging 144 ) An ACE inhibitor/angiotensin II receptor blocker is being taken. Eye exam current: scheduled for August.  Hypertension This is a chronic problem. The current episode started more than 1 year ago. The problem has been gradually worsening since onset. The problem is uncontrolled. Pertinent negatives include no anxiety, chest pain, headaches, malaise/fatigue or palpitations. Risk factors for coronary artery disease include obesity and sedentary lifestyle. Treatments tried: Has been without his medications since April.  There are no compliance problems.  There is no history of angina. There is no history of chronic renal disease.     Past Medical History:  Diagnosis Date   Adhesive capsulitis of right shoulder 12/02/2020   Diabetes mellitus without complication (HCC)    Hypertension    borderline   PVC (premature ventricular contraction) 04/29/2018   Sleep apnea    uses a cpap   Snoring      Family History  Problem Relation Age of Onset   Diabetes Father    Diabetes Brother    Hypertension Brother    Diabetes Sister    Hypertension Sister    Aneurysm Paternal Grandmother        brain     Current Outpatient Medications:    aspirin EC 81 MG tablet, Take 81 mg by mouth daily., Disp: , Rfl:    atorvastatin (LIPITOR) 20 MG tablet, Take 1  tablet (20 mg total) by mouth daily., Disp: 90 tablet, Rfl: 1   diclofenac Sodium (VOLTAREN) 1 % GEL, Apply 4 g topically 4 (four) times daily. Apply to affected areas 4 times daily as needed for pain., Disp: 100 g, Rfl: 2   hydrochlorothiazide (HYDRODIURIL) 25 MG tablet, TAKE 1 TABLET(25 MG) BY MOUTH DAILY, Disp: 90 tablet, Rfl: 1   ibuprofen (ADVIL) 800 MG tablet, Take 1 tablet (800 mg total) by mouth 2 (two) times daily as needed for moderate pain., Disp: 21 tablet, Rfl: 0    losartan (COZAAR) 25 MG tablet, As directed, Disp: 90 tablet, Rfl: 1   metFORMIN (GLUCOPHAGE) 500 MG tablet, TAKE 1 TABLET(500 MG) BY MOUTH TWICE DAILY WITH A MEAL, Disp: 60 tablet, Rfl: 2   UNABLE TO FIND, CPAP: At bedtime, Disp: , Rfl:    Dulaglutide (TRULICITY) 0.75 MG/0.5ML SOPN, INJECT 0.75 MG SUBCUTANEOUSLY ONE TIME PER WEEK Dx code 11.69, E66.01, Disp: 2 mL, Rfl: 1   Glucagon (GVOKE HYPOPEN 2-PACK) 0.5 MG/0.1ML SOAJ, Inject 0.5 mg into the skin as needed. (Patient not taking: Reported on 03/18/2022), Disp: 0.2 mL, Rfl: 2   No Known Allergies   Review of Systems  Constitutional: Negative.  Negative for malaise/fatigue.  Respiratory: Negative.    Cardiovascular:  Negative for chest pain, palpitations and leg swelling.  Neurological: Negative.  Negative for headaches.  Psychiatric/Behavioral: Negative.       Today's Vitals   09/21/22 0833  BP: 120/88  Pulse: 65  Temp: 97.9 F (36.6 C)  TempSrc: Oral  Weight: (!) 339 lb 12.8 oz (154.1 kg)  Height: 6\' 3"  (1.905 m)  PainSc: 0-No pain   Body mass index is 42.47 kg/m.  Wt Readings from Last 3 Encounters:  09/21/22 (!) 339 lb 12.8 oz (154.1 kg)  06/22/22 (!) 336 lb 6.4 oz (152.6 kg)  05/12/22 (!) 338 lb 9.6 oz (153.6 kg)     Objective:  Physical Exam Vitals reviewed.  Constitutional:      General: He is not in acute distress.    Appearance: Normal appearance. He is obese.  Cardiovascular:     Rate and Rhythm: Normal rate and regular rhythm.     Pulses: Normal pulses.     Heart sounds: Normal heart sounds. No murmur heard. Pulmonary:     Effort: Pulmonary effort is normal. No respiratory distress.     Breath sounds: Normal breath sounds. No wheezing.  Musculoskeletal:        General: No swelling or tenderness. Normal range of motion.  Skin:    General: Skin is warm and dry.     Capillary Refill: Capillary refill takes less than 2 seconds.  Neurological:     General: No focal deficit present.     Mental Status: He  is alert and oriented to person, place, and time.     Cranial Nerves: No cranial nerve deficit.     Motor: No weakness.  Psychiatric:        Mood and Affect: Mood normal.        Behavior: Behavior normal.        Thought Content: Thought content normal.        Judgment: Judgment normal.         Assessment And Plan:  Type 2 diabetes mellitus with morbid obesity (HCC) Assessment & Plan: Unfortunately he has not had Trulicity, PA was done but denied will try again, sample of Ozempic given until can get approved for Trulicity. HgbA1c at last visit was  improved to below 8.   Orders: -     Trulicity; INJECT 0.75 MG SUBCUTANEOUSLY ONE TIME PER WEEK Dx code 11.69, E66.01  Dispense: 2 mL; Refill: 1 -     BMP8+eGFR -     Hemoglobin A1c  Essential hypertension Assessment & Plan: Blood pressure is slightly elevated, continue current medications. Encouraged to focus on lifestyle modifications.   Orders: -     BMP8+eGFR  BMI 40.0-44.9, adult [facility]) Assessment & Plan: He is encouraged to strive for BMI less than 30 to decrease cardiac risk. Advised to aim for at least 150 minutes of exercise per week.     Return for controlled DM check 4 months.  Patient was given opportunity to ask questions. Patient verbalized understanding of the plan and was able to repeat key elements of the plan. All questions were answered to their satisfaction.    Jeanell Sparrow, FNP, have reviewed all documentation for this visit. The documentation on 09/21/22 for the exam, diagnosis, procedures, and orders are all accurate and complete.   IF YOU HAVE BEEN REFERRED TO A SPECIALIST, IT MAY TAKE 1-2 WEEKS TO SCHEDULE/PROCESS THE REFERRAL. IF YOU HAVE NOT HEARD FROM US/SPECIALIST IN TWO WEEKS, PLEASE GIVE [place] A CALL AT [phone].

## 2022-09-22 LAB — HEMOGLOBIN A1C
Est. average glucose Bld gHb Est-mCnc: 131 mg/dL
Hgb A1c MFr Bld: 6.2 % — ABNORMAL HIGH (ref 4.8–5.6)

## 2022-09-22 LAB — BMP8+EGFR
BUN/Creatinine Ratio: 16 (ref 9–20)
BUN: 13 mg/dL (ref 6–24)
CO2: 26 mmol/L (ref 20–29)
Calcium: 9.2 mg/dL (ref 8.7–10.2)
Chloride: 100 mmol/L (ref 96–106)
Creatinine, Ser: 0.82 mg/dL (ref 0.76–1.27)
Glucose: 129 mg/dL — ABNORMAL HIGH (ref 70–99)
Potassium: 4.3 mmol/L (ref 3.5–5.2)
Sodium: 139 mmol/L (ref 134–144)
eGFR: 110 mL/min/{1.73_m2} (ref >59)

## 2022-09-23 ENCOUNTER — Encounter (HOSPITAL_COMMUNITY): Payer: Self-pay | Admitting: Emergency Medicine

## 2022-09-23 ENCOUNTER — Ambulatory Visit (HOSPITAL_COMMUNITY)
Admission: EM | Admit: 2022-09-23 | Discharge: 2022-09-23 | Disposition: A | Discharge status: Home / Self Care | Payer: 59 | Attending: Nurse Practitioner | Admitting: Nurse Practitioner

## 2022-09-23 ENCOUNTER — Other Ambulatory Visit: Payer: Self-pay

## 2022-09-23 DIAGNOSIS — R42 Dizziness and giddiness: Secondary | ICD-10-CM

## 2022-09-23 LAB — POCT FASTING CBG KUC MANUAL ENTRY: POCT Glucose (KUC): 167 mg/dL — AB (ref 70–99)

## 2022-09-23 MED ORDER — MECLIZINE HCL 25 MG PO TABS: 25.0000 mg | ORAL_TABLET | Freq: Three times a day (TID) | ORAL | 0 refills | Status: AC | PRN

## 2022-09-23 NOTE — ED Provider Notes (Signed)
[facility]    CSN: [id number] Arrival date & time: 09/23/22  1019      History   Chief Complaint Chief Complaint  Patient presents with   Dizziness    HPI Luis Lopez is a 47 y.o. male.   Patient presents today to urgent care for complaint of "fainting spell."  He denies falling, loss of consciousness, or losing vision.  He reports approximately 10 seconds of room spinning sensation that occurred earlier this morning while sitting in his chair at home.  Reports the episode spontaneously resolved on its own and now he is feeling a little bit weak.  He denies symptoms worsen with bending over, head movement, or position changes.  No recent head injury, fall, accident, or trauma involving the head.  No recent fever, cough, or congestion.  No nausea or vomiting, ringing in the ears, headache, double or blurred vision, unsteady gait or postural instability, trouble speaking or trouble swallowing, pallor, diaphoresis, chest pain, or shortness of breath since the dizziness happened.    Past Medical History:  Diagnosis Date   Adhesive capsulitis of right shoulder 12/02/2020   Diabetes mellitus without complication (HCC)    Hypertension    borderline   PVC (premature ventricular contraction) 04/29/2018   Sleep apnea    uses a cpap   Snoring     Patient Active Problem List   Diagnosis Date Noted   Type 2 diabetes mellitus with morbid obesity (HCC) 09/21/2022   BMI 40.0-44.9, adult (HCC) 06/22/2022   Hypertensive heart disease without heart failure 06/22/2022   Chronic right shoulder pain 12/02/2020   Nontraumatic tear of supraspinatus tendon, right 10/29/2020   Degenerative superior labral anterior-to-posterior (SLAP) tear of right shoulder 10/29/2020   Arthrosis of right acromioclavicular joint 10/29/2020   Muscle spasm 07/26/2018   PVC (premature ventricular contraction) 04/29/2018   Abnormal EKG 03/01/2018   Essential hypertension 03/01/2018   Umbilical hernia  11/03/2017   Morbid obesity due to excess calories (HCC) 11/06/2015   Nocturia more than twice per night 11/06/2015   Sleep deprivation 11/06/2015   Hypersomnia with sleep apnea 11/06/2015   OSA (obstructive sleep apnea) 11/06/2015    Past Surgical History:  Procedure Laterality Date   INSERTION OF MESH N/A 11/03/2017   Procedure: INSERTION OF MESH;  Surgeon: Harriette Bouillon, MD;  Location: [facility];  Service: General;  Laterality: N/A;   SHOULDER ACROMIOPLASTY Right 10/29/2020   Procedure: RIGHT SHOULDER ARTHROSCOPY, EXTENSIVE DEBRIDEMENT, DISTAL CLAVICLE EXCISION, SUBACROMIAL DECOMPRESSION, BICEPS TENODESIS;  Surgeon: Tarry Kos, MD;  Location: [facility];  Service: Orthopedics;  Laterality: Right;   UMBILICAL HERNIA REPAIR  11/03/2017   UMBILICAL HERNIA REPAIR N/A 11/03/2017   Procedure: UMBILICAL HERNIA REPAIR WITH MESH;  Surgeon: Harriette Bouillon, MD;  Location: [facility];  Service: General;  Laterality: N/A;   WRIST SURGERY Left        Home Medications    Prior to Admission medications   Medication Sig Start Date End Date Taking? Authorizing Provider  meclizine (ANTIVERT) 25 MG tablet Take 1 tablet (25 mg total) by mouth 3 (three) times daily as needed for dizziness. 09/23/22  Yes Valentino Nose, NP  aspirin EC 81 MG tablet Take 81 mg by mouth daily.    [provider]  atorvastatin (LIPITOR) 20 MG tablet Take 1 tablet (20 mg total) by mouth daily. 05/12/22 05/12/23  Arnette Felts, FNP  diclofenac Sodium (VOLTAREN) 1 % GEL Apply 4 g topically 4 (four) times daily. Apply  to affected areas 4 times daily as needed for pain. 11/25/21   Theadora Rama Scales, PA-C  Dulaglutide (TRULICITY) 0.75 MG/0.5ML SOPN INJECT 0.75 MG SUBCUTANEOUSLY ONE TIME PER WEEK Dx code 11.69, E66.01 Patient not taking: Reported on 09/23/2022 09/21/22   Arnette Felts, FNP  Glucagon (GVOKE HYPOPEN 2-PACK) 0.5 MG/0.1ML SOAJ Inject 0.5 mg into the skin as needed. Patient not taking: Reported on  03/18/2022 09/03/20   Arnette Felts, FNP  hydrochlorothiazide (HYDRODIURIL) 25 MG tablet TAKE 1 TABLET(25 MG) BY MOUTH DAILY 05/12/22   Arnette Felts, FNP  ibuprofen (ADVIL) 800 MG tablet Take 1 tablet (800 mg total) by mouth 2 (two) times daily as needed for moderate pain. 03/16/22   Debby Freiberg, NP  losartan (COZAAR) 25 MG tablet As directed 05/12/22   Arnette Felts, FNP  metFORMIN (GLUCOPHAGE) 500 MG tablet TAKE 1 TABLET(500 MG) BY MOUTH TWICE DAILY WITH A MEAL 09/08/22   Arnette Felts, FNP  UNABLE TO FIND CPAP: At bedtime    [provider]    Family History Family History  Problem Relation Age of Onset   Diabetes Father    Diabetes Brother    Hypertension Brother    Diabetes Sister    Hypertension Sister    Aneurysm Paternal Grandmother        brain    Social History Social History   Tobacco Use   Smoking status: Former    Types: Cigarettes    Quit date: 03/23/2003    Years since quitting: 19.5   Smokeless tobacco: Never  Vaping Use   Vaping Use: Never used  Substance Use Topics   Alcohol use: No   Drug use: No     Allergies   Patient has no known allergies.   Review of Systems Review of Systems Per HPI  Physical Exam Triage Vital Signs ED Triage Vitals  Enc Vitals Group     BP 09/23/22 1028 139/89     Pulse Rate 09/23/22 1028 68     Resp 09/23/22 1028 20     Temp 09/23/22 1028 98.3 F (36.8 C)     Temp Source 09/23/22 1028 Oral     SpO2 09/23/22 1028 98 %     Weight --      Height --      Head Circumference --      Peak Flow --      Pain Score 09/23/22 1025 0     Pain Loc --      Pain Edu? --      Excl. in GC? --    Orthostatic VS for the past 24 hrs:  BP- Lying Pulse- Lying BP- Sitting Pulse- Sitting BP- Standing at 0 minutes Pulse- Standing at 0 minutes  09/23/22 1058 144/86 71 134/82 77 130/82 83    Updated Vital Signs BP 139/89 (BP Location: Right Arm)   Pulse 68   Temp 98.3 F (36.8 C) (Oral)   Resp 20   SpO2 98%    Visual Acuity Right Eye Distance:   Left Eye Distance:   Bilateral Distance:    Right Eye Near:   Left Eye Near:    Bilateral Near:     Physical Exam Vitals and nursing note reviewed.  Constitutional:      General: He is not in acute distress.    Appearance: Normal appearance. He is not ill-appearing, toxic-appearing or diaphoretic.  HENT:     Head: Normocephalic and atraumatic.     Right Ear: Tympanic membrane, ear  canal and external ear normal. There is no impacted cerumen.     Left Ear: Tympanic membrane, ear canal and external ear normal. There is no impacted cerumen.     Nose: Nose normal. No congestion or rhinorrhea.     Mouth/Throat:     Mouth: Mucous membranes are moist.     Pharynx: Oropharynx is clear. No posterior oropharyngeal erythema.  Eyes:     General: No scleral icterus.    Extraocular Movements: Extraocular movements intact.     Right eye: No nystagmus.     Left eye: No nystagmus.     Pupils: Pupils are equal, round, and reactive to light.  Cardiovascular:     Rate and Rhythm: Normal rate and regular rhythm.  Pulmonary:     Effort: Pulmonary effort is normal. No respiratory distress.     Breath sounds: Normal breath sounds. No wheezing, rhonchi or rales.  Abdominal:     General: Abdomen is flat. Bowel sounds are normal. There is no distension.     Palpations: Abdomen is soft.     Tenderness: There is no abdominal tenderness. There is no right CVA tenderness, left CVA tenderness or guarding.  Musculoskeletal:     Cervical back: Normal range of motion and neck supple. No rigidity or tenderness.  Lymphadenopathy:     Cervical: No cervical adenopathy.  Skin:    General: Skin is warm and dry.     Capillary Refill: Capillary refill takes less than 2 seconds.     Coloration: Skin is not jaundiced or pale.     Findings: No erythema.  Neurological:     General: No focal deficit present.     Mental Status: He is alert and oriented to person, place, and  time.     Cranial Nerves: Cranial nerves 2-12 are intact.     Sensory: Sensation is intact.     Motor: No weakness.     Coordination: Coordination is intact. Coordination normal. Heel to [facility] Test normal. Rapid alternating movements normal.     Gait: Gait is intact. Gait normal.  Psychiatric:        Behavior: Behavior is cooperative.      UC Treatments / Results  Labs (all labs ordered are listed, but only abnormal results are displayed) Labs Reviewed  POCT FASTING CBG [facility] MANUAL ENTRY - Abnormal; Notable for the following components:      Result Value   POCT Glucose ([facility]) 167 (*)    All other components within normal limits    EKG   Radiology No results found.  Procedures Procedures (including critical care time)  Medications Ordered in UC Medications - No data to display  Initial Impression / Assessment and Plan / UC Course  I have reviewed the triage vital signs and the nursing notes.  Pertinent labs & imaging results that were available during my care of the patient were reviewed by me and considered in my medical decision making (see chart for details).   Patient is well-appearing, normotensive, afebrile, not tachycardic, not tachypneic, oxygenating well on room air.    1. Vertigo EKG is reassuring when compared with previous; no ST segment or T wave elevations or depressions that are new Orthostatic vital signs are negative Random blood sugar 167 Examination is reassuring-no focal deficit and no nystagmus; vertigo had resolved by time he came to urgent care Start meclizine as needed, discussed that this may make him sleepy so do not take while driving or operating heavy machinery Note given  for work Strict ER precautions discussed  The patient was given the opportunity to ask questions.  All questions answered to their satisfaction.  The patient is in agreement to this plan.    Final Clinical Impressions(s) / UC Diagnoses   Final diagnoses:  Vertigo      Discharge Instructions      As we discussed, your symptoms are consistent with vertigo.  EKG, vital signs, and blood sugar today were stable.  If symptoms return, you can take the meclizine up to 3 times daily as needed.  Please rest and increase water intake today.  Seek care if symptoms persist or worsen despite treatment.     ED Prescriptions     Medication Sig Dispense Auth. Provider   meclizine (ANTIVERT) 25 MG tablet Take 1 tablet (25 mg total) by mouth 3 (three) times daily as needed for dizziness. 30 tablet Valentino Nose, NP      PDMP not reviewed this encounter.   Valentino Nose, NP 09/23/22 1242

## 2022-09-23 NOTE — Discharge Instructions (Signed)
As we discussed, your symptoms are consistent with vertigo.  EKG, vital signs, and blood sugar today were stable.  If symptoms return, you can take the meclizine up to 3 times daily as needed.  Please rest and increase water intake today.  Seek care if symptoms persist or worsen despite treatment.

## 2022-09-23 NOTE — ED Triage Notes (Signed)
Was sitting, noticed room spinning, then room stopped spinning.  Patient had another episode 2 weeks ago.  Blood sugar was 138.  Blood pressure 166/99 after returning to normal.  Patient did not pass out.    Feels weak and tired, no dizziness at present  Has been out of trulicity for a few months.  Seen by provider yesterday and all was well  Patient had not eaten prior to episode this morning

## 2022-11-06 ENCOUNTER — Other Ambulatory Visit: Payer: Self-pay | Admitting: Nurse Practitioner

## 2022-11-06 DIAGNOSIS — I2510 Atherosclerotic heart disease of native coronary artery without angina pectoris: Secondary | ICD-10-CM

## 2022-11-18 ENCOUNTER — Encounter: Payer: Self-pay | Admitting: Nurse Practitioner

## 2022-11-18 ENCOUNTER — Other Ambulatory Visit: Payer: Self-pay | Admitting: Nurse Practitioner

## 2022-11-18 DIAGNOSIS — I10 Essential (primary) hypertension: Secondary | ICD-10-CM

## 2022-12-14 ENCOUNTER — Other Ambulatory Visit: Payer: Self-pay | Admitting: Nurse Practitioner

## 2023-01-25 ENCOUNTER — Ambulatory Visit: Payer: 59 | Admitting: Nurse Practitioner

## 2023-02-01 ENCOUNTER — Encounter: Payer: Self-pay | Admitting: Nurse Practitioner

## 2023-02-01 ENCOUNTER — Ambulatory Visit (INDEPENDENT_AMBULATORY_CARE_PROVIDER_SITE_OTHER): Payer: 59 | Admitting: Nurse Practitioner

## 2023-02-01 DIAGNOSIS — Z2821 Immunization not carried out because of patient refusal: Secondary | ICD-10-CM

## 2023-02-01 DIAGNOSIS — Z6841 Body Mass Index (BMI) 40.0 and over, adult: Secondary | ICD-10-CM | POA: Diagnosis not present

## 2023-02-01 DIAGNOSIS — I10 Essential (primary) hypertension: Secondary | ICD-10-CM

## 2023-02-01 DIAGNOSIS — E1169 Type 2 diabetes mellitus with other specified complication: Secondary | ICD-10-CM

## 2023-02-01 MED ORDER — TRULICITY 1.5 MG/0.5ML ~~LOC~~ SOAJ
1.5000 mg | SUBCUTANEOUS | 1 refills | Status: DC
Start: 2023-02-01 — End: 2023-07-20

## 2023-02-01 NOTE — Assessment & Plan Note (Signed)
Blood pressure is slightly elevated, continue current medications. Encouraged to focus on lifestyle modifications.

## 2023-02-01 NOTE — Assessment & Plan Note (Signed)

## 2023-02-01 NOTE — Progress Notes (Signed)
Madelaine Bhat, CMA,acting as a Neurosurgeon for Arnette Felts, FNP.,have documented all relevant documentation on the behalf of Arnette Felts, FNP,as directed by  Arnette Felts, FNP while in the presence of Arnette Felts, FNP.  Subjective:  Patient ID: Luis Lopez , male    DOB: 01-29-1976 , 47 y.o.   MRN: [id number]  Chief Complaint  Patient presents with   Hypertension    HPI  Patient presents today for a bp and dm follow up, Patient reports compliance with medication. Patient denies any chest pain, SOB, or headaches. Patient has no concerns today. He is back walking and feeling better. Walks at least 2 miles per day. No breads or pastas. He is eating vegetables and meats. He states " I feel pretty good"   BP Readings from Last 3 Encounters: 02/01/23 : 130/80 09/23/22 : 139/89 09/21/22 : 120/88    Diabetes He presents for his follow-up diabetic visit. He has type 2 diabetes mellitus. Pertinent negatives for hypoglycemia include no headaches. There are no diabetic associated symptoms. Pertinent negatives for diabetes include no chest pain. There are no diabetic complications. Risk factors for coronary artery disease include male sex, obesity, diabetes mellitus, hypertension and sedentary lifestyle. Current diabetic treatment includes diet and oral agent (monotherapy) (currently not on Trulicity). His weight is stable. He is following a generally unhealthy diet. When asked about meal planning, he reported none. He has not had a previous visit with a dietitian. He participates in exercise intermittently. There is no change in his home blood glucose trend. (Blood sugar has been averaging 144 ) An ACE inhibitor/angiotensin II receptor blocker is being taken. Eye exam current: scheduled for August.  Hypertension This is a chronic problem. The current episode started more than 1 year ago. The problem has been gradually worsening since onset. The problem is uncontrolled. Pertinent negatives include no  anxiety, chest pain, headaches, malaise/fatigue or palpitations. Risk factors for coronary artery disease include obesity and sedentary lifestyle. Treatments tried: Has been without his medications since April.  There are no compliance problems.  There is no history of angina. There is no history of chronic renal disease.     Past Medical History:  Diagnosis Date   Adhesive capsulitis of right shoulder 12/02/2020   Diabetes mellitus without complication (HCC)    Hypertension    borderline   PVC (premature ventricular contraction) 04/29/2018   Sleep apnea    uses a cpap   Snoring      Family History  Problem Relation Age of Onset   Diabetes Father    Diabetes Brother    Hypertension Brother    Diabetes Sister    Hypertension Sister    Aneurysm Paternal Grandmother        brain     Current Outpatient Medications:    aspirin EC 81 MG tablet, Take 81 mg by mouth daily., Disp: , Rfl:    atorvastatin (LIPITOR) 20 MG tablet, TAKE 1 TABLET BY MOUTH EVERY DAY, Disp: 30 tablet, Rfl: 5   diclofenac Sodium (VOLTAREN) 1 % GEL, Apply 4 g topically 4 (four) times daily. Apply to affected areas 4 times daily as needed for pain., Disp: 100 g, Rfl: 2   Dulaglutide (TRULICITY) 1.5 MG/0.5ML SOAJ, Inject 1.5 mg into the skin once a week., Disp: 6 mL, Rfl: 1   hydrochlorothiazide (HYDRODIURIL) 25 MG tablet, TAKE 1 TABLET BY MOUTH DAILY, Disp: 90 tablet, Rfl: 2   ibuprofen (ADVIL) 800 MG tablet, Take 1 tablet (800 mg total) by  mouth 2 (two) times daily as needed for moderate pain., Disp: 21 tablet, Rfl: 0   losartan (COZAAR) 25 MG tablet, TAKE 1 TABLET BY MOUTH EVERY DAY, Disp: 90 tablet, Rfl: 2   meclizine (ANTIVERT) 25 MG tablet, Take 1 tablet (25 mg total) by mouth 3 (three) times daily as needed for dizziness., Disp: 30 tablet, Rfl: 0   metFORMIN (GLUCOPHAGE) 500 MG tablet, TAKE 1 TABLET(500 MG) BY MOUTH TWICE DAILY WITH A MEAL, Disp: 60 tablet, Rfl: 2   UNABLE TO FIND, CPAP: At bedtime, Disp: ,  Rfl:    Glucagon (GVOKE HYPOPEN 2-PACK) 0.5 MG/0.1ML SOAJ, Inject 0.5 mg into the skin as needed. (Patient not taking: Reported on 03/18/2022), Disp: 0.2 mL, Rfl: 2   No Known Allergies   Review of Systems  Constitutional: Negative.  Negative for malaise/fatigue.  HENT: Negative.    Eyes: Negative.   Respiratory: Negative.    Cardiovascular: Negative.  Negative for chest pain and palpitations.  Gastrointestinal: Negative.   Neurological: Negative.  Negative for headaches.  Psychiatric/Behavioral: Negative.       Today's Vitals   02/01/23 1040  BP: 130/80  Pulse: 80  Temp: 97.9 F (36.6 C)  TempSrc: Oral  Weight: (!) 340 lb 9.6 oz (154.5 kg)  Height: 6\' 3"  (1.905 m)  PainSc: 0-No pain   Body mass index is 42.57 kg/m.  Wt Readings from Last 3 Encounters:  02/01/23 (!) 340 lb 9.6 oz (154.5 kg)  09/21/22 (!) 339 lb 12.8 oz (154.1 kg)  06/22/22 (!) 336 lb 6.4 oz (152.6 kg)      Objective:  Physical Exam Vitals reviewed.  Constitutional:      General: He is not in acute distress.    Appearance: Normal appearance. He is obese.  Cardiovascular:     Rate and Rhythm: Normal rate and regular rhythm.     Pulses: Normal pulses.     Heart sounds: Normal heart sounds. No murmur heard. Pulmonary:     Effort: Pulmonary effort is normal. No respiratory distress.     Breath sounds: Normal breath sounds. No wheezing.  Musculoskeletal:        General: No swelling or tenderness. Normal range of motion.  Skin:    General: Skin is warm and dry.     Capillary Refill: Capillary refill takes less than 2 seconds.  Neurological:     General: No focal deficit present.     Mental Status: He is alert and oriented to person, place, and time.     Cranial Nerves: No cranial nerve deficit.     Motor: No weakness.  Psychiatric:        Mood and Affect: Mood normal.        Behavior: Behavior normal.        Thought Content: Thought content normal.        Judgment: Judgment normal.     Title    Diabetic Foot Exam - detailed Date & Time: 02/01/2023 11:32 AM Diabetic Foot exam was performed with the following findings: Yes  Visual Foot Exam completed.: Yes  Is there a history of foot ulcer?: No Is there a foot ulcer now?: No Is there swelling?: No Is there elevated skin temperature?: No Is there abnormal foot shape?: No Is there a claw toe deformity?: No Are the toenails long?: No Are the toenails thick?: No Are the toenails ingrown?: No Is the skin thin, fragile, shiny and hairless?": No Normal Range of Motion?: Yes Is there foot or ankle  muscle weakness?: No Do you have pain in calf while walking?: No Are the shoes appropriate in style and fit?: Yes Can the patient see the bottom of their feet?: Yes Pulse Foot Exam completed.: Yes   Right Posterior Tibialis: Present Left posterior Tibialis: Present   Right Dorsalis Pedis: Present Left Dorsalis Pedis: Present     Sensory Foot Exam Completed.: Yes Semmes-Weinstein Monofilament Test "+" means "has sensation" and "-" means "no sensation"   R Site 1-Great Toe: Pos L Site 1-Great Toe: Pos   R Site 4: Pos L Site 4: Pos   R Site 6: Pos L Site 6: Pos     Image components are not supported.   Image components are not supported. Image components are not supported.  [place] Comments Left foot lateral plantar surface with soft growth      Assessment And Plan:  Type 2 diabetes mellitus with morbid obesity (HCC) Assessment & Plan: He continues with Trulicity, HgbA1c decreased to 6.2 at last visit. Will increase to 1.5 mg weekly. He is advised if unable to get the medication to make [place] aware in advance diabetic foot exam done no abnormal findings.  Orders: -     Hemoglobin A1c -     Microalbumin / creatinine urine ratio -     Trulicity; Inject 1.5 mg into the skin once a week.  Dispense: 6 mL; Refill: 1  Essential hypertension Assessment & Plan: Blood pressure is slightly elevated, continue current medications.  Encouraged to focus on lifestyle modifications.   Orders: -     Basic metabolic panel  Influenza vaccination declined Assessment & Plan: Patient declined influenza vaccination at this time. Patient is aware that influenza vaccine prevents illness in 70% of healthy people, and reduces hospitalizations to 30-70% in elderly. This vaccine is recommended annually. Education has been provided regarding the importance of this vaccine but patient still declined. Advised may receive this vaccine at local pharmacy or Health Dept.or vaccine clinic. Aware to provide a copy of the vaccination record if obtained from local pharmacy or Health Dept.  Pt is willing to accept risk associated with refusing vaccination.    BMI 40.0-44.9, adult (HCC)  Morbid obesity due to excess calories [facility]) Assessment & Plan: he is encouraged to strive for BMI less than 30 to decrease cardiac risk. Advised to aim for at least 150 minutes of exercise per week.      Return for controlled DM check 4 months.  Patient was given opportunity to ask questions. Patient verbalized understanding of the plan and was able to repeat key elements of the plan. All questions were answered to their satisfaction.    Jeanell Sparrow, FNP, have reviewed all documentation for this visit. The documentation on 02/01/23 for the exam, diagnosis, procedures, and orders are all accurate and complete.   IF YOU HAVE BEEN REFERRED TO A SPECIALIST, IT MAY TAKE 1-2 WEEKS TO SCHEDULE/PROCESS THE REFERRAL. IF YOU HAVE NOT HEARD FROM US/SPECIALIST IN TWO WEEKS, PLEASE GIVE [place] A CALL AT [phone].

## 2023-02-01 NOTE — Assessment & Plan Note (Addendum)
He continues with Trulicity, HgbA1c decreased to 6.2 at last visit. Will increase to 1.5 mg weekly. He is advised if unable to get the medication to make [place] aware in advance diabetic foot exam done no abnormal findings.

## 2023-02-01 NOTE — Patient Instructions (Signed)
SAT 11/ 30/24 1:20P AT [facility] - please make sure to make this appt.

## 2023-02-01 NOTE — Assessment & Plan Note (Signed)
he is encouraged to strive for BMI less than 30 to decrease cardiac risk. Advised to aim for at least 150 minutes of exercise per week.

## 2023-02-02 LAB — HEMOGLOBIN A1C
Est. average glucose Bld gHb Est-mCnc: 166 mg/dL
Hgb A1c MFr Bld: 7.4 % — ABNORMAL HIGH (ref 4.8–5.6)

## 2023-02-02 LAB — BASIC METABOLIC PANEL
BUN/Creatinine Ratio: 12 (ref 9–20)
BUN: 10 mg/dL (ref 6–24)
CO2: 23 mmol/L (ref 20–29)
Calcium: 9.1 mg/dL (ref 8.7–10.2)
Chloride: 99 mmol/L (ref 96–106)
Creatinine, Ser: 0.81 mg/dL (ref 0.76–1.27)
Glucose: 118 mg/dL — ABNORMAL HIGH (ref 70–99)
Potassium: 3.8 mmol/L (ref 3.5–5.2)
Sodium: 139 mmol/L (ref 134–144)
eGFR: 111 mL/min/{1.73_m2} (ref >59)

## 2023-02-02 LAB — MICROALBUMIN / CREATININE URINE RATIO
Creatinine, Urine: 70.2 mg/dL
Microalb/Creat Ratio: 28 mg/g{creat} (ref 0–29)
Microalbumin, Urine: 19.5 ug/mL

## 2023-03-10 LAB — HM DIABETES EYE EXAM

## 2023-03-12 ENCOUNTER — Other Ambulatory Visit: Payer: Self-pay | Admitting: Nurse Practitioner

## 2023-05-09 ENCOUNTER — Other Ambulatory Visit: Payer: Self-pay | Admitting: Nurse Practitioner

## 2023-05-31 ENCOUNTER — Ambulatory Visit: Payer: 59 | Admitting: Nurse Practitioner

## 2023-05-31 NOTE — Progress Notes (Deleted)
 Madelaine Bhat, CMA,acting as a Neurosurgeon for Arnette Felts, FNP.,have documented all relevant documentation on the behalf of Arnette Felts, FNP,as directed by  Arnette Felts, FNP while in the presence of Arnette Felts, FNP.  Subjective:  Patient ID: Luis Lopez , male    DOB: 1975-09-27 , 48 y.o.   MRN: [id number]  No chief complaint on file.   HPI  Patient presents today for a bp and dm follow up, Patient reports compliance with medication. Patient denies any chest pain, SOB, or headaches. Patient has no concerns today.     Past Medical History:  Diagnosis Date  . Adhesive capsulitis of right shoulder 12/02/2020  . Diabetes mellitus without complication (HCC)   . Hypertension    borderline  . PVC (premature ventricular contraction) 04/29/2018  . Sleep apnea    uses a cpap  . Snoring      Family History  Problem Relation Age of Onset  . Diabetes Father   . Diabetes Brother   . Hypertension Brother   . Diabetes Sister   . Hypertension Sister   . Aneurysm Paternal Grandmother        brain     Current Outpatient Medications:  .  aspirin EC 81 MG tablet, Take 81 mg by mouth daily., Disp: , Rfl:  .  atorvastatin (LIPITOR) 20 MG tablet, TAKE 1 TABLET BY MOUTH EVERY DAY, Disp: 30 tablet, Rfl: 5 .  diclofenac Sodium (VOLTAREN) 1 % GEL, Apply 4 g topically 4 (four) times daily. Apply to affected areas 4 times daily as needed for pain., Disp: 100 g, Rfl: 2 .  Dulaglutide (TRULICITY) 1.5 MG/0.5ML SOAJ, Inject 1.5 mg into the skin once a week., Disp: 6 mL, Rfl: 1 .  Glucagon (GVOKE HYPOPEN 2-PACK) 0.5 MG/0.1ML SOAJ, Inject 0.5 mg into the skin as needed. (Patient not taking: Reported on 03/18/2022), Disp: 0.2 mL, Rfl: 2 .  hydrochlorothiazide (HYDRODIURIL) 25 MG tablet, TAKE 1 TABLET BY MOUTH DAILY, Disp: 90 tablet, Rfl: 2 .  ibuprofen (ADVIL) 800 MG tablet, Take 1 tablet (800 mg total) by mouth 2 (two) times daily as needed for moderate pain., Disp: 21 tablet, Rfl: 0 .  losartan (COZAAR)  25 MG tablet, TAKE 1 TABLET BY MOUTH EVERY DAY, Disp: 90 tablet, Rfl: 2 .  meclizine (ANTIVERT) 25 MG tablet, Take 1 tablet (25 mg total) by mouth 3 (three) times daily as needed for dizziness., Disp: 30 tablet, Rfl: 0 .  metFORMIN (GLUCOPHAGE) 500 MG tablet, TAKE 1 TABLET(500 MG) BY MOUTH TWICE DAILY WITH A MEAL, Disp: 180 tablet, Rfl: 1 .  UNABLE TO FIND, CPAP: At bedtime, Disp: , Rfl:    No Known Allergies   Review of Systems   There were no vitals filed for this visit. There is no height or weight on file to calculate BMI.  Wt Readings from Last 3 Encounters:  02/01/23 (!) 340 lb 9.6 oz (154.5 kg)  09/21/22 (!) 339 lb 12.8 oz (154.1 kg)  06/22/22 (!) 336 lb 6.4 oz (152.6 kg)    The 10-year ASCVD risk score (Arnett DK, et al., 2019) is: 5.3%   Values used to calculate the score:     Age: 84 years     Sex: Male     Is Non-Hispanic African American: No     Diabetic: Yes     Tobacco smoker: No     Systolic Blood Pressure: 130 mmHg     Is BP treated: Yes     HDL Cholesterol:  42 mg/dL     Total Cholesterol: 177 mg/dL  Objective:  Physical Exam      Assessment And Plan:  Type 2 diabetes mellitus with morbid obesity (HCC)  Essential hypertension    No follow-ups on file.  Patient was given opportunity to ask questions. Patient verbalized understanding of the plan and was able to repeat key elements of the plan. All questions were answered to their satisfaction.    Jeanell Sparrow, FNP, have reviewed all documentation for this visit. The documentation on 05/31/23 for the exam, diagnosis, procedures, and orders are all accurate and complete.   IF YOU HAVE BEEN REFERRED TO A SPECIALIST, IT MAY TAKE 1-2 WEEKS TO SCHEDULE/PROCESS THE REFERRAL. IF YOU HAVE NOT HEARD FROM US/SPECIALIST IN TWO WEEKS, PLEASE GIVE [place] A CALL AT [phone].

## 2023-05-31 NOTE — Progress Notes (Signed)
 Madelaine Bhat, CMA,acting as a Neurosurgeon for Arnette Felts, FNP.,have documented all relevant documentation on the behalf of Arnette Felts, FNP,as directed by  Arnette Felts, FNP while in the presence of Arnette Felts, FNP.  Subjective:  Patient ID: Luis Lopez , male    DOB: 1975-05-25 , 48 y.o.   MRN: [id number]  Chief Complaint  Patient presents with   Hypertension    HPI  Patient presents today for a bp and dm follow up, Patient reports compliance with medication. Patient denies any chest pain, SOB, or headaches. Patient has no concerns today. He is not exercising as much due to his work schedule working 6 days a week.   Diabetes He presents for his follow-up diabetic visit. He has type 2 diabetes mellitus. Pertinent negatives for hypoglycemia include no headaches. There are no diabetic associated symptoms. Pertinent negatives for diabetes include no chest pain. There are no diabetic complications. Risk factors for coronary artery disease include male sex, obesity, diabetes mellitus, hypertension and sedentary lifestyle. Current diabetic treatment includes diet and oral agent (monotherapy) (currently not on Trulicity). His weight is stable. He is following a generally unhealthy diet. When asked about meal planning, he reported none. He has not had a previous visit with a dietitian. He participates in exercise intermittently. There is no change in his home blood glucose trend. (Blood sugar has been averaging 136) An ACE inhibitor/angiotensin II receptor blocker is being taken. He does not see a podiatrist.Eye exam is current.  Hypertension This is a chronic problem. The current episode started more than 1 year ago. The problem has been gradually worsening since onset. The problem is uncontrolled. Pertinent negatives include no anxiety, chest pain, headaches, malaise/fatigue or palpitations. Risk factors for coronary artery disease include obesity and sedentary lifestyle. Treatments tried: . There are  no compliance problems.  There is no history of angina. There is no history of chronic renal disease.     Past Medical History:  Diagnosis Date   Adhesive capsulitis of right shoulder 12/02/2020   Diabetes mellitus without complication (HCC)    Hypertension    borderline   PVC (premature ventricular contraction) 04/29/2018   Sleep apnea    uses a cpap   Snoring      Family History  Problem Relation Age of Onset   Diabetes Father    Diabetes Brother    Hypertension Brother    Diabetes Sister    Hypertension Sister    Aneurysm Paternal Grandmother        brain     Current Outpatient Medications:    aspirin EC 81 MG tablet, Take 81 mg by mouth daily., Disp: , Rfl:    atorvastatin (LIPITOR) 20 MG tablet, TAKE 1 TABLET BY MOUTH EVERY DAY, Disp: 30 tablet, Rfl: 5   diclofenac Sodium (VOLTAREN) 1 % GEL, Apply 4 g topically 4 (four) times daily. Apply to affected areas 4 times daily as needed for pain., Disp: 100 g, Rfl: 2   Dulaglutide (TRULICITY) 1.5 MG/0.5ML SOAJ, Inject 1.5 mg into the skin once a week., Disp: 6 mL, Rfl: 1   hydrochlorothiazide (HYDRODIURIL) 25 MG tablet, TAKE 1 TABLET BY MOUTH DAILY, Disp: 90 tablet, Rfl: 2   ibuprofen (ADVIL) 800 MG tablet, Take 1 tablet (800 mg total) by mouth 2 (two) times daily as needed for moderate pain., Disp: 21 tablet, Rfl: 0   losartan (COZAAR) 25 MG tablet, TAKE 1 TABLET BY MOUTH EVERY DAY, Disp: 90 tablet, Rfl: 2   meclizine (ANTIVERT)  25 MG tablet, Take 1 tablet (25 mg total) by mouth 3 (three) times daily as needed for dizziness., Disp: 30 tablet, Rfl: 0   metFORMIN (GLUCOPHAGE) 500 MG tablet, TAKE 1 TABLET(500 MG) BY MOUTH TWICE DAILY WITH A MEAL, Disp: 180 tablet, Rfl: 1   UNABLE TO FIND, CPAP: At bedtime, Disp: , Rfl:    Glucagon (GVOKE HYPOPEN 2-PACK) 0.5 MG/0.1ML SOAJ, Inject 0.5 mg into the skin as needed. (Patient not taking: Reported on 06/01/2023), Disp: 0.2 mL, Rfl: 2   No Known Allergies   Review of Systems   Constitutional: Negative.  Negative for malaise/fatigue.  Respiratory: Negative.    Cardiovascular:  Negative for chest pain, palpitations and leg swelling.  Neurological:  Negative for headaches.  Psychiatric/Behavioral: Negative.       Today's Vitals   06/01/23 0920 06/01/23 1009  BP: (!) 140/80 130/74  Pulse: 69   Temp: 98 F (36.7 C)   TempSrc: Oral   Weight: (!) 344 lb 6.4 oz (156.2 kg)   Height: 6\' 3"  (1.905 m)   PainSc: 0-No pain    Body mass index is 43.05 kg/m.  Wt Readings from Last 3 Encounters:  06/01/23 (!) 344 lb 6.4 oz (156.2 kg)  02/01/23 (!) 340 lb 9.6 oz (154.5 kg)  09/21/22 (!) 339 lb 12.8 oz (154.1 kg)    The 10-year ASCVD risk score (Arnett DK, et al., 2019) is: 5.3%   Values used to calculate the score:     Age: 87 years     Sex: Male     Is Non-Hispanic African American: No     Diabetic: Yes     Tobacco smoker: No     Systolic Blood Pressure: 130 mmHg     Is BP treated: Yes     HDL Cholesterol: 42 mg/dL     Total Cholesterol: 177 mg/dL  Objective:  Physical Exam Vitals and nursing note reviewed.  Constitutional:      General: He is not in acute distress.    Appearance: Normal appearance. He is obese.  Cardiovascular:     Rate and Rhythm: Normal rate and regular rhythm.     Pulses: Normal pulses.     Heart sounds: Normal heart sounds. No murmur heard. Pulmonary:     Effort: Pulmonary effort is normal. No respiratory distress.     Breath sounds: Normal breath sounds. No wheezing.  Musculoskeletal:        General: No swelling or tenderness. Normal range of motion.  Skin:    General: Skin is warm and dry.     Capillary Refill: Capillary refill takes less than 2 seconds.  Neurological:     General: No focal deficit present.     Mental Status: He is alert and oriented to person, place, and time.     Cranial Nerves: No cranial nerve deficit.     Motor: No weakness.  Psychiatric:        Mood and Affect: Mood normal.        Behavior:  Behavior normal.        Thought Content: Thought content normal.        Judgment: Judgment normal.         Assessment And Plan:  Type 2 diabetes mellitus with morbid obesity (HCC) Assessment & Plan: He continues with Trulicity and tolerating well. Continue current medications, will check A1c.   Orders: -     Hemoglobin A1c  Essential hypertension Assessment & Plan: Blood pressure is slightly elevated, repeat  is improved, continue current medications. Encouraged to focus on lifestyle modifications.   Orders: -     BMP8+eGFR  BMI 40.0-44.9, adult [facility]) Assessment & Plan: He is encouraged to strive for BMI less than 30 to decrease cardiac risk. Advised to aim for at least 150 minutes of exercise per week.      Return for controlled DM check 4 months.  Patient was given opportunity to ask questions. Patient verbalized understanding of the plan and was able to repeat key elements of the plan. All questions were answered to their satisfaction.    Jeanell Sparrow, FNP, have reviewed all documentation for this visit. The documentation on 06/01/23 for the exam, diagnosis, procedures, and orders are all accurate and complete.   IF YOU HAVE BEEN REFERRED TO A SPECIALIST, IT MAY TAKE 1-2 WEEKS TO SCHEDULE/PROCESS THE REFERRAL. IF YOU HAVE NOT HEARD FROM US/SPECIALIST IN TWO WEEKS, PLEASE GIVE [place] A CALL AT [phone] X [phone].

## 2023-06-01 ENCOUNTER — Encounter: Payer: Self-pay | Admitting: Nurse Practitioner

## 2023-06-01 ENCOUNTER — Ambulatory Visit (INDEPENDENT_AMBULATORY_CARE_PROVIDER_SITE_OTHER): Admitting: Nurse Practitioner

## 2023-06-01 DIAGNOSIS — E1169 Type 2 diabetes mellitus with other specified complication: Secondary | ICD-10-CM | POA: Diagnosis not present

## 2023-06-01 DIAGNOSIS — Z6841 Body Mass Index (BMI) 40.0 and over, adult: Secondary | ICD-10-CM

## 2023-06-01 DIAGNOSIS — I10 Essential (primary) hypertension: Secondary | ICD-10-CM

## 2023-06-01 LAB — BMP8+EGFR
BUN/Creatinine Ratio: 15 (ref 9–20)
BUN: 13 mg/dL (ref 6–24)
CO2: 26 mmol/L (ref 20–29)
Calcium: 9.3 mg/dL (ref 8.7–10.2)
Chloride: 101 mmol/L (ref 96–106)
Creatinine, Ser: 0.89 mg/dL (ref 0.76–1.27)
Glucose: 119 mg/dL — ABNORMAL HIGH (ref 70–99)
Potassium: 4.4 mmol/L (ref 3.5–5.2)
Sodium: 143 mmol/L (ref 134–144)
eGFR: 107 mL/min/{1.73_m2} (ref >59)

## 2023-06-01 LAB — HEMOGLOBIN A1C
Est. average glucose Bld gHb Est-mCnc: 123 mg/dL
Hgb A1c MFr Bld: 5.9 % — ABNORMAL HIGH (ref 4.8–5.6)

## 2023-06-12 ENCOUNTER — Encounter: Payer: Self-pay | Admitting: Nurse Practitioner

## 2023-06-12 NOTE — Assessment & Plan Note (Signed)
 He is encouraged to strive for BMI less than 30 to decrease cardiac risk. Advised to aim for at least 150 minutes of exercise per week.

## 2023-06-12 NOTE — Assessment & Plan Note (Addendum)
 Blood pressure is slightly elevated, repeat is improved, continue current medications. Encouraged to focus on lifestyle modifications.

## 2023-06-12 NOTE — Assessment & Plan Note (Signed)
 He continues with Trulicity and tolerating well. Continue current medications, will check A1c.

## 2023-07-07 ENCOUNTER — Other Ambulatory Visit: Payer: Self-pay | Admitting: Nurse Practitioner

## 2023-07-07 DIAGNOSIS — E1169 Type 2 diabetes mellitus with other specified complication: Secondary | ICD-10-CM

## 2023-08-09 ENCOUNTER — Other Ambulatory Visit: Payer: Self-pay | Admitting: Nurse Practitioner

## 2023-08-09 DIAGNOSIS — I10 Essential (primary) hypertension: Secondary | ICD-10-CM

## 2023-08-15 ENCOUNTER — Other Ambulatory Visit: Payer: Self-pay | Admitting: Nurse Practitioner

## 2023-08-15 DIAGNOSIS — I10 Essential (primary) hypertension: Secondary | ICD-10-CM

## 2023-09-11 ENCOUNTER — Other Ambulatory Visit: Payer: Self-pay | Admitting: Nurse Practitioner

## 2023-09-11 DIAGNOSIS — E1169 Type 2 diabetes mellitus with other specified complication: Secondary | ICD-10-CM

## 2023-10-10 NOTE — Progress Notes (Signed)
 LILLETTE Kristeen JINNY Gladis, CMA,acting as a Neurosurgeon for Gaines Ada, FNP.,have documented all relevant documentation on the behalf of Gaines Ada, FNP,as directed by  Gaines Ada, FNP while in the presence of Gaines Ada, FNP.  Subjective:  Patient ID: Luis Lopez , male    DOB: 1975/11/01 , 48 y.o.   MRN: [id number]  Chief Complaint  Patient presents with   Hypertension    Patient presents today for a bp and dm follow up, Patient reports compliance with medication. Patient denies any chest pain, SOB, or headaches.    Obesity    Patient reports he is eating less calories and is still gaining weight, he reports he eats once a day and is still gaining weight.    HPI Discussed the use of AI scribe software for clinical note transcription with the patient, who gave verbal consent to proceed.  History of Present Illness Luis Lopez is a 48 year old male with diabetes and hypertension who presents for a follow-up visit.  Blood sugar levels have been ranging between 134 and 143 mg/dL. He is currently taking Trulicity  once a week and metformin  twice a day for diabetes management. He has been working out three days a week and has noticed a change in his clothing fit. He is satisfied with his previous A1c result of 5.9%.  For hypertension, he is taking losartan  and hydrochlorothiazide . He also takes aspirin as part of his regimen.  Regarding cholesterol management, he is prescribed atorvastatin  but takes it irregularly, citing dietary changes as a reason for inconsistency.  In terms of social history, he is trying to exercise more frequently despite his work schedule, which currently allows for three days of exercise per week.   Diabetes He presents for his follow-up diabetic  visit. He has type 2 diabetes mellitus. Pertinent negatives for hypoglycemia include no headaches. There are no diabetic associated symptoms. Pertinent negatives for diabetes include no chest pain and no fatigue. Symptoms are stable. There are no diabetic complications. Risk factors for coronary artery disease include male sex, obesity, diabetes mellitus, hypertension and sedentary lifestyle. Current diabetic treatment includes diet and oral agent (monotherapy) (currently not on Trulicity ). His weight is stable. He is following a generally unhealthy diet. When asked about meal planning, he reported none. He has not had a previous visit with a dietitian. He participates in exercise intermittently. There is no change in his home blood glucose trend. (Blood sugar has been averaging 134-143) An ACE inhibitor/angiotensin II receptor blocker is being taken. He does not see a podiatrist.Eye exam is current.  Hypertension This is a chronic problem. The current episode started more than 1 year ago. The problem has been gradually worsening since onset. The problem is uncontrolled. Pertinent negatives include no anxiety, chest pain, headaches, malaise/fatigue or palpitations. Risk factors for coronary artery disease include obesity, sedentary lifestyle, diabetes mellitus, male gender and dyslipidemia. Past treatments include beta blockers. There are no compliance problems.  There is no history of angina. There is no history of chronic renal disease.    Past Medical History:  Diagnosis Date   Abnormal EKG 03/01/2018   Adhesive capsulitis of right shoulder 12/02/2020   Diabetes mellitus without complication (HCC)    Hypertension    borderline   Nontraumatic tear of supraspinatus tendon, right 10/29/2020   PVC (premature ventricular contraction) 04/29/2018   Sleep apnea    uses a cpap   Snoring      Family History  Problem Relation Age of Onset   Diabetes  Father    Diabetes Brother    Hypertension Brother     Diabetes Sister    Hypertension Sister    Aneurysm Paternal Grandmother        brain     Current Outpatient Medications:    aspirin EC 81 MG tablet, Take 81 mg by mouth daily., Disp: , Rfl:    diclofenac  Sodium (VOLTAREN ) 1 % GEL, Apply 4 g topically 4 (four) times daily. Apply to affected areas 4 times daily as needed for pain., Disp: 100 g, Rfl: 2   hydrochlorothiazide  (HYDRODIURIL ) 25 MG tablet, TAKE 1 TABLET BY MOUTH EVERY DAY, Disp: 30 tablet, Rfl: 8   ibuprofen  (ADVIL ) 800 MG tablet, Take 1 tablet (800 mg total) by mouth 2 (two) times daily as needed for moderate pain., Disp: 21 tablet, Rfl: 0   losartan  (COZAAR ) 25 MG tablet, TAKE 1 TABLET BY MOUTH EVERY DAY, Disp: 30 tablet, Rfl: 8   meclizine  (ANTIVERT ) 25 MG tablet, Take 1 tablet (25 mg total) by mouth 3 (three) times daily as needed for dizziness., Disp: 30 tablet, Rfl: 0   metFORMIN  (GLUCOPHAGE ) 500 MG tablet, TAKE 1 TABLET(500 MG) BY MOUTH TWICE DAILY WITH A MEAL, Disp: 180 tablet, Rfl: 1   Semaglutide ,0.25 or 0.5MG /DOS, 2 MG/3ML SOPN, Inject 0.5 mg into the skin once a week., Disp: 3 mL, Rfl: 0   UNABLE TO FIND, CPAP: At bedtime, Disp: , Rfl:    atorvastatin  (LIPITOR) 20 MG tablet, Take 1 tablet (20 mg total) by mouth daily., Disp: 90 tablet, Rfl: 1   Glucagon  (GVOKE HYPOPEN  2-PACK) 0.5 MG/0.1ML SOAJ, Inject 0.5 mg into the skin as needed. (Patient not taking: Reported on 10/11/2023), Disp: 0.2 mL, Rfl: 2   No Known Allergies   Review of Systems  Constitutional: Negative.  Negative for fatigue and malaise/fatigue.  Respiratory: Negative.    Cardiovascular:  Negative for chest pain, palpitations and leg swelling.  Neurological:  Negative for headaches.  Psychiatric/Behavioral: Negative.       Today's Vitals   10/11/23 0848  BP: 130/80  Pulse: (!) 55  Temp: 98.6 F (37 C)  TempSrc: Oral  Weight: (!) 351 lb 9.6 oz (159.5 kg)  Height: 6' 3 (1.905 m)  PainSc: 0-No pain   Body mass index is 43.95 kg/m.  Wt  Readings from Last 3 Encounters:  10/11/23 (!) 351 lb 9.6 oz (159.5 kg)  06/01/23 (!) 344 lb 6.4 oz (156.2 kg)  02/01/23 (!) 340 lb 9.6 oz (154.5 kg)     Objective:  Physical Exam Vitals and nursing note reviewed.  Constitutional:      General: He is not in acute distress.    Appearance: Normal appearance. He is obese.  Cardiovascular:     Rate and Rhythm: Normal rate and regular rhythm.     Pulses: Normal pulses.     Heart sounds: Normal heart sounds. No murmur heard. Pulmonary:     Effort: Pulmonary effort is normal. No respiratory distress.     Breath sounds: Normal breath sounds. No wheezing.  Musculoskeletal:        General: No swelling or tenderness. Normal range of motion.  Skin:    General: Skin is warm and dry.     Capillary Refill: Capillary refill takes less than 2 seconds.  Neurological:     General: No focal deficit present.     Mental Status: He is alert and oriented to person, place, and time.     Cranial Nerves: No cranial nerve deficit.  Motor: No weakness.  Psychiatric:        Mood and Affect: Mood normal.        Behavior: Behavior normal.        Thought Content: Thought content normal.        Judgment: Judgment normal.      Assessment And Plan:  Type 2 diabetes mellitus with morbid obesity (HCC) Assessment & Plan: He continues with Trulicity  and tolerating well however I would like to switch him to Ozempic  as he has had better results overall with Ozempic , samples given for him to take 0.5 mg to take weekly. Continue current medications, will check A1c. Blood glucose levels range from 134 to 143 mg/dL. Weight gain noted.  - Discontinue Trulicity . - Prescribe Ozempic  0.5 mg once weekly with a three-week sample. - Adjust dose to 1 mg or 2 mg based on response after notification of Ozempic  pickup. - Encourage continued exercise and dietary modifications for weight loss and diabetes management.  Orders: -     Hemoglobin A1c -     Semaglutide (0.25 or  0.5MG /DOS); Inject 0.5 mg into the skin once a week.  Dispense: 3 mL; Refill: 0  Essential hypertension Assessment & Plan: Blood pressure is slightly elevated at border, continue current medications. Encouraged to focus on lifestyle modifications.   Orders: -     BMP8+eGFR  BMI 40.0-44.9, adult [facility]) Assessment & Plan: He is encouraged to strive for BMI less than 30 to decrease cardiac risk. Advised to aim for at least 150 minutes of exercise per week.    OSA (obstructive sleep apnea)  Morbid obesity due to excess calories [facility]) Assessment & Plan: He is encouraged to strive for BMI less than 30 to decrease cardiac risk. Advised to aim for at least 150 minutes of exercise per week.    Atherosclerotic cardiovascular disease Assessment & Plan: Irregular Atorvastatin  use. Essential for cardiovascular protection, especially with diabetes. - Prescribe a 90-day supply of Atorvastatin  to improve compliance. - Educate on the importance of regular Atorvastatin  use for cardiovascular protection.  Orders: -     Atorvastatin  Calcium ; Take 1 tablet (20 mg total) by mouth daily.  Dispense: 90 tablet; Refill: 1  Encounter for screening -     Hepatitis B surface antibody,qualitative    Return for controlled DM check 4 months.  Patient was given opportunity to ask questions. Patient verbalized understanding of the plan and was able to repeat key elements of the plan. All questions were answered to their satisfaction.    LILLETTE Gaines Ada, FNP, have reviewed all documentation for this visit. The documentation on 10/11/23 for the exam, diagnosis, procedures, and orders are all accurate and complete.   IF YOU HAVE BEEN REFERRED TO A SPECIALIST, IT MAY TAKE 1-2 WEEKS TO SCHEDULE/PROCESS THE REFERRAL. IF YOU HAVE NOT HEARD FROM US /SPECIALIST IN TWO WEEKS, PLEASE GIVE US  A CALL AT [phone].

## 2023-10-11 ENCOUNTER — Ambulatory Visit: Admitting: Nurse Practitioner

## 2023-10-11 ENCOUNTER — Encounter: Payer: Self-pay | Admitting: Nurse Practitioner

## 2023-10-11 DIAGNOSIS — E1169 Type 2 diabetes mellitus with other specified complication: Secondary | ICD-10-CM

## 2023-10-11 DIAGNOSIS — G4733 Obstructive sleep apnea (adult) (pediatric): Secondary | ICD-10-CM

## 2023-10-11 DIAGNOSIS — I2510 Atherosclerotic heart disease of native coronary artery without angina pectoris: Secondary | ICD-10-CM

## 2023-10-11 DIAGNOSIS — I119 Hypertensive heart disease without heart failure: Secondary | ICD-10-CM

## 2023-10-11 DIAGNOSIS — Z6841 Body Mass Index (BMI) 40.0 and over, adult: Secondary | ICD-10-CM

## 2023-10-11 DIAGNOSIS — Z139 Encounter for screening, unspecified: Secondary | ICD-10-CM

## 2023-10-11 DIAGNOSIS — I10 Essential (primary) hypertension: Secondary | ICD-10-CM

## 2023-10-11 MED ORDER — SEMAGLUTIDE(0.25 OR 0.5MG/DOS) 2 MG/3ML ~~LOC~~ SOPN
0.5000 mg | PEN_INJECTOR | SUBCUTANEOUS | 0 refills | Status: DC
Start: 2023-10-11 — End: 2023-10-25

## 2023-10-11 MED ORDER — ATORVASTATIN CALCIUM 20 MG PO TABS: 20.0000 mg | ORAL_TABLET | Freq: Every day | ORAL | 1 refills | Status: DC

## 2023-10-11 NOTE — Assessment & Plan Note (Addendum)
 Blood pressure is slightly elevated at border, continue current medications. Encouraged to focus on lifestyle modifications.

## 2023-10-11 NOTE — Assessment & Plan Note (Addendum)
 He continues with Trulicity  and tolerating well however I would like to switch him to Ozempic  as he has had better results overall with Ozempic , samples given for him to take 0.5 mg to take weekly. Continue current medications, will check A1c. Blood glucose levels range from 134 to 143 mg/dL. Weight gain noted.  - Discontinue Trulicity . - Prescribe Ozempic  0.5 mg once weekly with a three-week sample. - Adjust dose to 1 mg or 2 mg based on response after notification of Ozempic  pickup. - Encourage continued exercise and dietary modifications for weight loss and diabetes management.

## 2023-10-11 NOTE — Assessment & Plan Note (Signed)
 He is encouraged to strive for BMI less than 30 to decrease cardiac risk. Advised to aim for at least 150 minutes of exercise per week.

## 2023-10-11 NOTE — Assessment & Plan Note (Addendum)
 Irregular Atorvastatin  use. Essential for cardiovascular protection, especially with diabetes. - Prescribe a 90-day supply of Atorvastatin  to improve compliance. - Educate on the importance of regular Atorvastatin  use for cardiovascular protection.

## 2023-10-12 LAB — BMP8+EGFR
BUN/Creatinine Ratio: 15 (ref 9–20)
BUN: 13 mg/dL (ref 6–24)
CO2: 22 mmol/L (ref 20–29)
Calcium: 9.1 mg/dL (ref 8.7–10.2)
Chloride: 102 mmol/L (ref 96–106)
Creatinine, Ser: 0.85 mg/dL (ref 0.76–1.27)
Glucose: 131 mg/dL — ABNORMAL HIGH (ref 70–99)
Potassium: 4 mmol/L (ref 3.5–5.2)
Sodium: 143 mmol/L (ref 134–144)
eGFR: 109 mL/min/1.73 (ref >59)

## 2023-10-12 LAB — HEMOGLOBIN A1C
Est. average glucose Bld gHb Est-mCnc: 131 mg/dL
Hgb A1c MFr Bld: 6.2 % — ABNORMAL HIGH (ref 4.8–5.6)

## 2023-10-12 LAB — HEPATITIS B SURFACE ANTIBODY,QUALITATIVE: Hep B Surface Ab, Qual: NONREACTIVE

## 2023-10-21 ENCOUNTER — Encounter: Payer: Self-pay | Admitting: Nurse Practitioner

## 2023-10-25 ENCOUNTER — Other Ambulatory Visit: Payer: Self-pay | Admitting: Nurse Practitioner

## 2023-10-25 MED ORDER — SEMAGLUTIDE (1 MG/DOSE) 4 MG/3ML ~~LOC~~ SOPN: 1.0000 mg | PEN_INJECTOR | SUBCUTANEOUS | 1 refills | Status: DC

## 2023-11-13 ENCOUNTER — Other Ambulatory Visit: Payer: Self-pay | Admitting: Nurse Practitioner

## 2023-11-14 ENCOUNTER — Other Ambulatory Visit: Payer: Self-pay | Admitting: Nurse Practitioner

## 2023-11-14 NOTE — Telephone Encounter (Signed)
 He is now on 1 mg, it was sent on 10/25/2023

## 2024-01-23 ENCOUNTER — Ambulatory Visit: Payer: Self-pay | Admitting: Nurse Practitioner

## 2024-01-23 ENCOUNTER — Encounter: Payer: Self-pay | Admitting: Nurse Practitioner

## 2024-01-23 DIAGNOSIS — E1169 Type 2 diabetes mellitus with other specified complication: Secondary | ICD-10-CM

## 2024-01-23 DIAGNOSIS — Z139 Encounter for screening, unspecified: Secondary | ICD-10-CM | POA: Diagnosis not present

## 2024-01-23 DIAGNOSIS — I119 Hypertensive heart disease without heart failure: Secondary | ICD-10-CM

## 2024-01-23 DIAGNOSIS — Z23 Encounter for immunization: Secondary | ICD-10-CM

## 2024-01-23 DIAGNOSIS — Z6841 Body Mass Index (BMI) 40.0 and over, adult: Secondary | ICD-10-CM

## 2024-01-23 DIAGNOSIS — I2510 Atherosclerotic heart disease of native coronary artery without angina pectoris: Secondary | ICD-10-CM

## 2024-01-23 DIAGNOSIS — I10 Essential (primary) hypertension: Secondary | ICD-10-CM

## 2024-01-23 NOTE — Assessment & Plan Note (Signed)
 Weight loss of 20 pounds since July. Lifestyle modifications ongoing. - Encouraged continuation of lifestyle modifications including weight management and exercise. - Aim for 150 minutes of exercise per week.

## 2024-01-23 NOTE — Progress Notes (Signed)
 LILLETTE Kristeen JINNY Gladis, CMA,acting as a neurosurgeon for Luis Ada, FNP.,have documented all relevant documentation on the behalf of Luis Ada, FNP,as directed by  Luis Ada, FNP while in the presence of Luis Ada, FNP.  Subjective:  Patient ID: Luis Lopez , male    DOB: July 19, 1975 , 48 y.o.   MRN: [id number]  Chief Complaint  Patient presents with   Hypertension    Patient presents today for a bp and dm follow up, Patient reports compliance with medication. Patient denies any chest pain, SOB, or headaches. Patient has no concerns today.     HPI Discussed the use of AI scribe software for clinical note transcription with the patient, who gave verbal consent to proceed.  History of Present Illness Luis Lopez is a 48 year old male with diabetes who presents for a follow-up visit.  He has been managing his diabetes with Ozempic  at a dose of 1 mg, experiencing no nausea. His blood sugar levels have improved, averaging around 130-131 mg/dL. His last recorded A1c was 6.2%.  He has experienced a significant weight loss of approximately 20 pounds since July, when he weighed 351 pounds. He attributes this to a decreased appetite, eating only one meal a day. No increased frequency of urination, thirst, or hunger is noted.  He has made lifestyle changes, including cutting out coffee and trying to get more sleep, which he feels has improved his overall well-being. He engages in some physical activity, though not as much as he would like. He continues to wear compression socks and monitors his salt intake to manage his health.  He feels better since starting his cholesterol medication. He has not had a physical exam this year.   Diabetes He presents for his follow-up diabetic visit. He has type 2 diabetes mellitus. Pertinent negatives for hypoglycemia include no headaches. There are no diabetic associated symptoms. Pertinent negatives for diabetes include no chest pain and no fatigue. Symptoms are  stable. There are no diabetic complications. Risk factors for coronary artery disease include male sex, obesity, diabetes mellitus, hypertension and sedentary lifestyle. Current diabetic treatment includes diet and oral agent (monotherapy) (currently not on Trulicity ). His weight is stable. He is following a generally unhealthy diet. When asked about meal planning, he reported none. He has not had a previous visit with a dietitian. He participates in exercise intermittently. There is no change in his home blood glucose trend. (Blood sugar has been averaging 130) An ACE inhibitor/angiotensin II receptor blocker is being taken. He does not see a podiatrist.Eye exam is current.  Hypertension This is a chronic problem. The current episode started more than 1 year ago. The problem has been gradually worsening since onset. The problem is uncontrolled. Pertinent negatives include no anxiety, chest pain, headaches, malaise/fatigue or palpitations. Risk factors for coronary artery disease include obesity, sedentary lifestyle, diabetes mellitus, male gender and dyslipidemia. Past treatments include beta blockers. There are no compliance problems.  There is no history of angina. There is no history of chronic renal disease.     Past Medical History:  Diagnosis Date   Abnormal EKG 03/01/2018   Adhesive capsulitis of right shoulder 12/02/2020   Diabetes mellitus without complication (HCC)    Hypertension    borderline   Nontraumatic tear of supraspinatus tendon, right 10/29/2020   PVC (premature ventricular contraction) 04/29/2018   Sleep apnea    uses a cpap   Snoring      Family History  Problem Relation Age of Onset   Diabetes  Father    Diabetes Brother    Hypertension Brother    Diabetes Sister    Hypertension Sister    Aneurysm Paternal Grandmother        brain     Current Outpatient Medications:    aspirin EC 81 MG tablet, Take 81 mg by mouth daily., Disp: , Rfl:    atorvastatin  (LIPITOR)  20 MG tablet, Take 1 tablet (20 mg total) by mouth daily., Disp: 90 tablet, Rfl: 1   diclofenac  Sodium (VOLTAREN ) 1 % GEL, Apply 4 g topically 4 (four) times daily. Apply to affected areas 4 times daily as needed for pain., Disp: 100 g, Rfl: 2   hydrochlorothiazide  (HYDRODIURIL ) 25 MG tablet, TAKE 1 TABLET BY MOUTH EVERY DAY, Disp: 30 tablet, Rfl: 8   ibuprofen  (ADVIL ) 800 MG tablet, Take 1 tablet (800 mg total) by mouth 2 (two) times daily as needed for moderate pain., Disp: 21 tablet, Rfl: 0   losartan  (COZAAR ) 25 MG tablet, TAKE 1 TABLET BY MOUTH EVERY DAY, Disp: 30 tablet, Rfl: 8   meclizine  (ANTIVERT ) 25 MG tablet, Take 1 tablet (25 mg total) by mouth 3 (three) times daily as needed for dizziness., Disp: 30 tablet, Rfl: 0   metFORMIN  (GLUCOPHAGE ) 500 MG tablet, TAKE 1 TABLET(500 MG) BY MOUTH TWICE DAILY WITH A MEAL, Disp: 180 tablet, Rfl: 1   Semaglutide , 1 MG/DOSE, 4 MG/3ML SOPN, Inject 1 mg into the skin once a week., Disp: 9 mL, Rfl: 1   UNABLE TO FIND, CPAP: At bedtime, Disp: , Rfl:    Glucagon  (GVOKE HYPOPEN  2-PACK) 0.5 MG/0.1ML SOAJ, Inject 0.5 mg into the skin as needed. (Patient not taking: Reported on 01/23/2024), Disp: 0.2 mL, Rfl: 2   No Known Allergies   Review of Systems  Constitutional: Negative.  Negative for fatigue and malaise/fatigue.  Respiratory: Negative.    Cardiovascular:  Negative for chest pain, palpitations and leg swelling.  Neurological:  Negative for headaches.  Psychiatric/Behavioral: Negative.       Today's Vitals   01/23/24 0832  BP: 110/70  Pulse: 65  Temp: 98.7 F (37.1 C)  TempSrc: Oral  Weight: (!) 338 lb 6.4 oz (153.5 kg)  Height: 6' 3 (1.905 m)  PainSc: 0-No pain   Body mass index is 42.3 kg/m.  Wt Readings from Last 3 Encounters:  01/23/24 (!) 338 lb 6.4 oz (153.5 kg)  10/11/23 (!) 351 lb 9.6 oz (159.5 kg)  06/01/23 (!) 344 lb 6.4 oz (156.2 kg)      Objective:  Physical Exam Vitals and nursing note reviewed.  Constitutional:       General: He is not in acute distress.    Appearance: Normal appearance. He is obese.  Cardiovascular:     Rate and Rhythm: Normal rate and regular rhythm.     Pulses: Normal pulses.     Heart sounds: Normal heart sounds. No murmur heard. Pulmonary:     Effort: Pulmonary effort is normal. No respiratory distress.     Breath sounds: Normal breath sounds. No wheezing.  Musculoskeletal:        General: No swelling or tenderness. Normal range of motion.  Skin:    General: Skin is warm and dry.     Capillary Refill: Capillary refill takes less than 2 seconds.  Neurological:     General: No focal deficit present.     Mental Status: He is alert and oriented to person, place, and time.     Cranial Nerves: No cranial nerve deficit.  Motor: No weakness.  Psychiatric:        Mood and Affect: Mood normal.        Behavior: Behavior normal.        Thought Content: Thought content normal.        Judgment: Judgment normal.         Assessment And Plan:  Type 2 diabetes mellitus with morbid obesity (HCC) Assessment & Plan: Managed with Semaglutide . Blood glucose averages 130-131 mg/dL. A1c increased to 6.2%. Weight loss of 20 pounds since July. Improved well-being with lifestyle changes. - Continue Semaglutide  1 mg subcutaneous once a week. - Repeat A1c test. - Encouraged continued lifestyle modifications including weight management and exercise.  Orders: -     Hemoglobin A1c -     Microalbumin / creatinine urine ratio -     Lipid panel  Essential hypertension Assessment & Plan: Blood pressure well-controlled with current medication. - Continue current antihypertensive medications.  Orders: -     BMP8+eGFR -     Microalbumin / creatinine urine ratio  BMI 40.0-44.9, adult [facility]) Assessment & Plan: Weight loss of 20 pounds since July. Lifestyle modifications ongoing. - Encouraged continuation of lifestyle modifications including weight management and exercise. - Aim for 150  minutes of exercise per week.   Need for influenza vaccination -     Flu vaccine trivalent PF, 6mos and older(Flulaval,Afluria,Fluarix,Fluzone)  Encounter for screening  Atherosclerotic cardiovascular disease Assessment & Plan: Managed with Atorvastatin . Reports improved well-being with medication. - Continue Atorvastatin  20 mg oral daily. - Schedule physical examination for next year.  Orders: -     Lipid panel    Return for HM 4 months with DM f/u.  Patient was given opportunity to ask questions. Patient verbalized understanding of the plan and was able to repeat key elements of the plan. All questions were answered to their satisfaction.    LILLETTE Luis Ada, FNP, have reviewed all documentation for this visit. The documentation on 01/23/24 for the exam, diagnosis, procedures, and orders are all accurate and complete.   IF YOU HAVE BEEN REFERRED TO A SPECIALIST, IT MAY TAKE 1-2 WEEKS TO SCHEDULE/PROCESS THE REFERRAL. IF YOU HAVE NOT HEARD FROM US /SPECIALIST IN TWO WEEKS, PLEASE GIVE US  A CALL AT [phone].

## 2024-01-23 NOTE — Assessment & Plan Note (Signed)
 Managed with Semaglutide . Blood glucose averages 130-131 mg/dL. A1c increased to 6.2%. Weight loss of 20 pounds since July. Improved well-being with lifestyle changes. - Continue Semaglutide  1 mg subcutaneous once a week. - Repeat A1c test. - Encouraged continued lifestyle modifications including weight management and exercise.

## 2024-01-23 NOTE — Assessment & Plan Note (Signed)
 Blood pressure well-controlled with current medication. - Continue current antihypertensive medications.

## 2024-01-23 NOTE — Assessment & Plan Note (Signed)
 Managed with Atorvastatin . Reports improved well-being with medication. - Continue Atorvastatin  20 mg oral daily. - Schedule physical examination for next year.

## 2024-01-24 LAB — BMP8+EGFR
BUN/Creatinine Ratio: 16 (ref 9–20)
BUN: 14 mg/dL (ref 6–24)
CO2: 25 mmol/L (ref 20–29)
Calcium: 9.1 mg/dL (ref 8.7–10.2)
Chloride: 101 mmol/L (ref 96–106)
Creatinine, Ser: 0.86 mg/dL (ref 0.76–1.27)
Glucose: 106 mg/dL — ABNORMAL HIGH (ref 70–99)
Potassium: 3.9 mmol/L (ref 3.5–5.2)
Sodium: 141 mmol/L (ref 134–144)
eGFR: 108 mL/min/1.73 (ref >59)

## 2024-01-24 LAB — LIPID PANEL
Chol/HDL Ratio: 2.8 ratio (ref 0.0–5.0)
Cholesterol, Total: 99 mg/dL — ABNORMAL LOW (ref 100–199)
HDL: 35 mg/dL — ABNORMAL LOW (ref >39)
LDL Chol Calc (NIH): 52 mg/dL (ref 0–99)
Triglycerides: 47 mg/dL (ref 0–149)
VLDL Cholesterol Cal: 12 mg/dL (ref 5–40)

## 2024-01-24 LAB — MICROALBUMIN / CREATININE URINE RATIO
Creatinine, Urine: 140.6 mg/dL
Microalb/Creat Ratio: 27 mg/g{creat} (ref 0–29)
Microalbumin, Urine: 38.3 ug/mL

## 2024-01-24 LAB — HEMOGLOBIN A1C
Est. average glucose Bld gHb Est-mCnc: 117 mg/dL
Hgb A1c MFr Bld: 5.7 % — ABNORMAL HIGH (ref 4.8–5.6)

## 2024-04-10 ENCOUNTER — Other Ambulatory Visit: Payer: Self-pay | Admitting: Nurse Practitioner

## 2024-04-19 ENCOUNTER — Other Ambulatory Visit: Payer: Self-pay | Admitting: Nurse Practitioner

## 2024-04-19 DIAGNOSIS — I2510 Atherosclerotic heart disease of native coronary artery without angina pectoris: Secondary | ICD-10-CM

## 2024-05-24 ENCOUNTER — Ambulatory Visit: Admitting: Nurse Practitioner
